# Patient Record
Sex: Female | Born: 1990 | Race: White | Hispanic: No | Marital: Married | Smoking: Never smoker
Health system: Southern US, Community
[De-identification: ages and names within clinical notes are randomized; demographics above are authoritative.]

## PROBLEM LIST (undated history)

## (undated) DIAGNOSIS — Z87442 Personal history of urinary calculi: Secondary | ICD-10-CM

## (undated) DIAGNOSIS — Z973 Presence of spectacles and contact lenses: Secondary | ICD-10-CM

## (undated) DIAGNOSIS — R350 Frequency of micturition: Secondary | ICD-10-CM

## (undated) DIAGNOSIS — N201 Calculus of ureter: Secondary | ICD-10-CM

## (undated) DIAGNOSIS — R3915 Urgency of urination: Secondary | ICD-10-CM

## (undated) DIAGNOSIS — N83209 Unspecified ovarian cyst, unspecified side: Secondary | ICD-10-CM

## (undated) DIAGNOSIS — K219 Gastro-esophageal reflux disease without esophagitis: Secondary | ICD-10-CM

## (undated) DIAGNOSIS — F419 Anxiety disorder, unspecified: Secondary | ICD-10-CM

## (undated) DIAGNOSIS — J45909 Unspecified asthma, uncomplicated: Secondary | ICD-10-CM

## (undated) DIAGNOSIS — Z9889 Other specified postprocedural states: Secondary | ICD-10-CM

## (undated) DIAGNOSIS — T8859XA Other complications of anesthesia, initial encounter: Secondary | ICD-10-CM

## (undated) DIAGNOSIS — R319 Hematuria, unspecified: Secondary | ICD-10-CM

## (undated) DIAGNOSIS — T4145XA Adverse effect of unspecified anesthetic, initial encounter: Secondary | ICD-10-CM

## (undated) DIAGNOSIS — R112 Nausea with vomiting, unspecified: Secondary | ICD-10-CM

## (undated) DIAGNOSIS — I10 Essential (primary) hypertension: Secondary | ICD-10-CM

## (undated) HISTORY — DX: Other specified postprocedural states: R11.2

## (undated) HISTORY — DX: Unspecified ovarian cyst, unspecified side: N83.209

## (undated) HISTORY — PX: OTHER SURGICAL HISTORY: SHX169

---
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
## 2013-05-16 ENCOUNTER — Ambulatory Visit (INDEPENDENT_AMBULATORY_CARE_PROVIDER_SITE_OTHER): Payer: Medicaid Other | Admitting: Adult Health

## 2013-05-16 ENCOUNTER — Encounter: Payer: Self-pay | Admitting: Adult Health

## 2013-05-16 ENCOUNTER — Other Ambulatory Visit (HOSPITAL_COMMUNITY)
Admission: RE | Admit: 2013-05-16 | Discharge: 2013-05-16 | Disposition: A | Discharge status: Home / Self Care | Payer: Medicaid Other | Source: Ambulatory Visit | Attending: Adult Health | Admitting: Adult Health

## 2013-05-16 VITALS — BP 128/88 | HR 76 | Ht 61.5 in | Wt 199.8 lb

## 2013-05-16 DIAGNOSIS — Z01419 Encounter for gynecological examination (general) (routine) without abnormal findings: Secondary | ICD-10-CM | POA: Insufficient documentation

## 2013-05-16 DIAGNOSIS — Z309 Encounter for contraceptive management, unspecified: Secondary | ICD-10-CM

## 2013-05-16 DIAGNOSIS — Z3202 Encounter for pregnancy test, result negative: Secondary | ICD-10-CM

## 2013-05-16 DIAGNOSIS — Z3049 Encounter for surveillance of other contraceptives: Secondary | ICD-10-CM

## 2013-05-16 DIAGNOSIS — Z113 Encounter for screening for infections with a predominantly sexual mode of transmission: Secondary | ICD-10-CM | POA: Insufficient documentation

## 2013-05-16 LAB — HEPATITIS C ANTIBODY: HCV Ab: NEGATIVE

## 2013-05-16 MED ORDER — ETONOGESTREL-ETHINYL ESTRADIOL 0.12-0.015 MG/24HR VA RING: VAGINAL_RING | VAGINAL | Status: DC

## 2013-05-16 NOTE — Patient Instructions (Addendum)
Physical in 1 year Use condoms  Call prn

## 2013-05-16 NOTE — Progress Notes (Signed)
Patient ID: Cathy Wells, female   DOB: 06-21-1991, 22 y.o.   MRN: [id number] History of Present Illness: Cathy Wells is a 22 year old white female in to discuss birth control and hs family planning medicaid and has to have a physical first.   Current Medications, Allergies, Past Medical History, Past Surgical History, Family History and Social History were reviewed in [employer] [facility] electronic medical record.     Review of Systems: Patient denies any headaches, blurred vision, shortness of breath, chest pain, abdominal pain, problems with bowel movements, urination, or intercourse. No joint pain or mood swings.    Physical Exam:BP 128/88  Pulse 76  Ht 5' 1.5" (1.562 m)  Wt 199 lb 12.8 oz (90.629 kg)  BMI 37.15 kg/m2  LMP 07/05/2014Urine pregnancy test negative General:  Well developed, well nourished, no acute distress Skin:  Warm and dry Neck:  Midline trachea, normal thyroid Lungs; Clear to auscultation bilaterally Breast:  No dominant palpable mass, retraction, or nipple discharge Cardiovascular: Regular rate and rhythm Abdomen:  Soft, non tender, no hepatosplenomegaly Pelvic:  External genitalia is normal in appearance.  The vagina is normal in appearance.  The cervix is nullpareous.Pap with GC/CHL  Uterus is felt to be normal size, shape, and contour.  No                adnexal masses or tenderness noted. Extremities:  No swelling or varicosities noted Psych:   Alert and cooperative, seems happy  Discussed birth control and wants nuva ring   Impression: Yearly exam-family planning medicaid Contraceptive management STD screening    Plan: Physical in 1 year Check HIV,RPR,HSV2, Hept B&C Rx nuva ring disp 1 ring with 11 refills. Ok to start today and use condoms

## 2013-12-15 ENCOUNTER — Encounter (HOSPITAL_COMMUNITY): Payer: Self-pay | Admitting: Emergency Medicine

## 2013-12-15 ENCOUNTER — Emergency Department (HOSPITAL_COMMUNITY)
Admission: EM | Admit: 2013-12-15 | Discharge: 2013-12-16 | Disposition: A | Discharge status: Home / Self Care | Payer: Medicaid Other | Attending: Emergency Medicine | Admitting: Emergency Medicine

## 2013-12-15 DIAGNOSIS — R519 Headache, unspecified: Secondary | ICD-10-CM

## 2013-12-15 DIAGNOSIS — Z880 Allergy status to penicillin: Secondary | ICD-10-CM | POA: Insufficient documentation

## 2013-12-15 DIAGNOSIS — I10 Essential (primary) hypertension: Secondary | ICD-10-CM

## 2013-12-15 DIAGNOSIS — M542 Cervicalgia: Secondary | ICD-10-CM | POA: Insufficient documentation

## 2013-12-15 DIAGNOSIS — J45909 Unspecified asthma, uncomplicated: Secondary | ICD-10-CM | POA: Insufficient documentation

## 2013-12-15 DIAGNOSIS — R51 Headache: Secondary | ICD-10-CM | POA: Insufficient documentation

## 2013-12-15 MED ORDER — BUTALBITAL-APAP-CAFFEINE 50-325-40 MG PO TABS: 1.0000 | ORAL_TABLET | Freq: Four times a day (QID) | ORAL | Status: AC | PRN

## 2013-12-15 MED ORDER — CYCLOBENZAPRINE HCL 10 MG PO TABS: 10.0000 mg | ORAL_TABLET | Freq: Two times a day (BID) | ORAL | Status: DC | PRN

## 2013-12-15 NOTE — ED Notes (Signed)
Pt reporting intermittent headache for last couple days which she believes is due to HTN.  Pt reports checking BP at home and reported result was aprox. 180/120

## 2013-12-15 NOTE — Discharge Instructions (Signed)
Headache: ° °You are having a headache. No specific cause was found today for your headache. It may have been a migraine or other cause of headache. Stress, anxiety, fatigue, and depression are common triggers for headaches. Your headache today does not appear to be life-threatening or require hospitalization, but often the exact cause of headaches is not determined in the emergency department. Therefore, followup with your doctor is very important to find out what may have caused your headache, and whether or not you need any further diagnostic testing or treatment. Sometimes headaches can appear benign but then more serious symptoms can develop which should prompt an immediate reevaluation by your doctor or the emergency department. ° °Seek immediate medical attention if: ° °· You develop possible problems with medications prescribed. °· The medications don't resolve your headache, if it recurs, or if you have multiple episodes of vomiting or can't take fluids by mouth °· You have a change from the usual headache. °· If you developed a sudden severe headache or confusion, become poorly responsive or faint, developed a fever above 100.4 or problems breathing, have a change in speech, vision, swallowing or understanding, or developed new weakness, numbness, tingling, incoordination or have a seizure. ° °If you don't have a family doctor to follow up with, see the follow up list below - call this morning for a follow-up appointment in the next 1-2 days. ° °

## 2013-12-15 NOTE — ED Provider Notes (Signed)
CSN: [id number]     Arrival date & time 12/15/13  2301 History   This chart was scribed for Vida RollerBrian D Khi Mcmillen, MD, by Yevette EdwardsAngela Bracken, ED Scribe. This patient was seen in room APA01/APA01 and the patient's care was started at 11:42 PM.  First MD Initiated Contact with Patient 12/15/13 2340     Chief Complaint  Patient presents with  . Hypertension   The history is provided by the patient and the spouse. No language interpreter was used.   HPI Comments: Cathy Wells is a 23 y.o. female who presents to the Emergency Department complaining of a headache which she characterizes as "excruciating" and which began a week ago (mild) and had been coming and going in an intermittent fashion for a week.  It worsened this evening. At bedside, the headache is currently resolved and went away spontaneously prior to arrival.  Her mother was concerned as to the cause of her headache and measured  Her BP measured at home - 180/129 in one arm and 170/112 in the other arm; in the ED her BP was 140/93. The pt denies a h/o HTN; she has a family history of HTN. 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 She also experienced neck pain and muscle-tension; the pt treated the pain with flexeril. She also has nasal congestion. She denies a cough. She also denies pressure under her eyes, dental pain, or pain associated with opening her jaw. No f/c/n/v/diarrhea.  Additionally, she denies blurred vision increased from baseline. She also denies slurred speech, numbness/paresthesia, dizziness, vision changes, or difficulties ambulating. She also denies OTC cold medication, smoking, or drinking. She has a h/o allergies; she uses medication as needed. The pt uses IBU a couple times of month.   Past Medical History  Diagnosis Date  . Allergy   . Asthma    Past Surgical History  Procedure Laterality Date  . Appendectomy     Family History  Problem Relation Age of Onset  .  Hypertension Mother   . Asthma Father   . Hypertension Father   . Hypertension Maternal Grandfather   . Diabetes Paternal Grandmother   . Hypertension Paternal Grandmother   . COPD Paternal Grandfather    History  Substance Use Topics  . Smoking status: Never Smoker   . Smokeless tobacco: Never Used  . Alcohol Use: No   No OB history provided.  Review of Systems  A complete 10 system review of systems was obtained, and all systems were negative except where indicated in the HPI and PE.    Allergies  Doxycycline; Amoxapine and related; Azithromycin; Clarithromycin; Penicillins; and Sulfa antibiotics  Home Medications   Current Outpatient Rx  Name  Route  Sig  Dispense  Refill  . butalbital-acetaminophen-caffeine (FIORICET) 50-325-40 MG per tablet   Oral   Take 1-2 tablets by mouth every 6 (six) hours as needed for headache.   20 tablet   0   . cyclobenzaprine (FLEXERIL) 10 MG tablet   Oral   Take 1 tablet (10 mg total) by mouth 2 (two) times daily as needed for muscle spasms.   20 tablet   0   . etonogestrel-ethinyl estradiol (NUVARING) 0.12-0.015 MG/24HR vaginal ring      Insert vaginally and leave in place for 3 consecutive weeks, then remove for 1 week.   1 each   12    Triage Vitals: BP 140/93  Pulse 104  Temp(Src) 98.7 F (37.1 C)  Resp 24  Ht 5\' 1"  (1.549 m)  Wt 200 lb (90.719 kg)  BMI 37.81 kg/m2  SpO2 100%  LMP 12/08/2013  Physical Exam  Nursing note and vitals reviewed. Constitutional: She is oriented to person, place, and time. She appears well-developed and well-nourished. No distress.  HENT:  Head: Normocephalic and atraumatic.  Nasal passages are clear - no d/c, clear TM's, clear OP. No sinus TTP  Eyes: EOM are normal. Pupils are equal, round, and reactive to light.  Neck: Neck supple. No tracheal deviation present.  Cardiovascular: Normal rate, regular rhythm, normal heart sounds and intact distal pulses.   Pulmonary/Chest: Effort  normal and breath sounds normal. No respiratory distress.  Abdominal: Soft. There is no tenderness.  Musculoskeletal: Normal range of motion. She exhibits no edema and no tenderness.  Lymphadenopathy:    She has no cervical adenopathy.  Neurological: She is alert and oriented to person, place, and time.  Neurologic exam:  Speech clear, pupils equal round reactive to light, extraocular movements intact  Normal peripheral visual fields Cranial nerves III through XII normal including no facial droop Follows commands, moves all extremities x4, normal strength to bilateral upper and lower extremities at all major muscle groups including grip Coordination intact, no limb ataxia, finger-nose-finger normal No pronator drift Gait normal   Skin: Skin is warm and dry.  Psychiatric: She has a normal mood and affect. Her behavior is normal.    ED Course  Procedures (including critical care time)  DIAGNOSTIC STUDIES: Oxygen Saturation is 100% on room air, normal by my interpretation.    COORDINATION OF CARE:  11:53 PM- Discussed treatment plan with patient, which includes instructing the pt to not use any OTC than tylenol, and the patient agreed to the plan.   Labs Review Labs Reviewed - No data to display Imaging Review No results found.  EKG Interpretation   None       MDM   Final diagnoses:  Headache  Hypertension    Overall she is well appaering, her BP has improved and here is 143/93, she has been encouraged to f/u in outpt setting - she is not on any meds chronically which would elevate this.  i would question the BP cuff being used at home as well as her pain / anxiety and stress related to HA - (appears to be tension headache and is currently resolved).  No red flags of pathological headache including no fever, no stiff neck and no neurological sx.  Meds given in ED:  Medications - No data to display  New Prescriptions   BUTALBITAL-ACETAMINOPHEN-CAFFEINE (FIORICET)  50-325-40 MG PER TABLET    Take 1-2 tablets by mouth every 6 (six) hours as needed for headache.   CYCLOBENZAPRINE (FLEXERIL) 10 MG TABLET    Take 1 tablet (10 mg total) by mouth 2 (two) times daily as needed for muscle spasms.      I personally performed the services described in this documentation, which was scribed in my presence. The recorded information has been reviewed and is accurate.       Vida Roller, MD 12/16/13 [phone]

## 2015-01-28 ENCOUNTER — Encounter: Payer: Self-pay | Admitting: Adult Health

## 2015-01-28 ENCOUNTER — Ambulatory Visit (INDEPENDENT_AMBULATORY_CARE_PROVIDER_SITE_OTHER): Payer: BLUE CROSS/BLUE SHIELD | Admitting: Adult Health

## 2015-01-28 ENCOUNTER — Other Ambulatory Visit (HOSPITAL_COMMUNITY)
Admission: RE | Admit: 2015-01-28 | Discharge: 2015-01-28 | Disposition: A | Discharge status: Home / Self Care | Payer: BLUE CROSS/BLUE SHIELD | Source: Ambulatory Visit | Attending: Obstetrics and Gynecology | Admitting: Obstetrics and Gynecology

## 2015-01-28 VITALS — BP 124/90 | HR 92 | Ht 61.0 in | Wt 209.0 lb

## 2015-01-28 DIAGNOSIS — Z01419 Encounter for gynecological examination (general) (routine) without abnormal findings: Secondary | ICD-10-CM | POA: Insufficient documentation

## 2015-01-28 NOTE — Progress Notes (Signed)
Patient ID: Renato BattlesVictoria L Mullany, female   DOB: [birth year], 24 y.o.   MRN: [id number] History of Present Illness: Benetta SparVictoria is a 24 year old white female, married in for well woman gyn exam and pap.She stopped birth control last year in May, and desires pregnancy in near future.   Current Medications, Allergies, Past Medical History, Past Surgical History, Family History and Social History were reviewed in [facility] electronic medical record.     Review of Systems: Patient denies any headaches, hearing loss, fatigue, blurred vision, shortness of breath, chest pain, abdominal pain, problems with bowel movements, urination, or intercourse. No joint pain or mood swings.    Physical Exam:BP 124/90 mmHg  Pulse 92  Ht 5\' 1"  (1.549 m)  Wt 209 lb (94.802 kg)  BMI 39.51 kg/m2  LMP 01/20/2015 General:  Well developed, well nourished, no acute distress Skin:  Warm and dry Neck:  Midline trachea, normal thyroid, good ROM, no lymphadenopathy Lungs; Clear to auscultation bilaterally Breast:  No dominant palpable mass, retraction, or nipple discharge Cardiovascular: Regular rate and rhythm Abdomen:  Soft, non tender, no hepatosplenomegaly Pelvic:  External genitalia is normal in appearance, no lesions.  The vagina is normal in appearance, with good color, moisture and rugae. Urethra has no lesions or masses. The cervix is smooth, pap performed.  Uterus is felt to be normal size, shape, and contour.  No adnexal masses or tenderness noted.Bladder is non tender, no masses felt. Extremities/musculoskeletal:  No swelling or varicosities noted, no clubbing or cyanosis Psych:  No mood changes, alert and cooperative,seems happy   Impression: Well woman gyn exam with pap     Plan: Physical in 1 year Review preparing for pregnancy Take OTC prenatal or folic acid Discussed timing of sex and lay there after, pee before

## 2015-01-28 NOTE — Patient Instructions (Signed)
Physical in 1 year Take folic acid or prenatal vitamin Preparing for Pregnancy Before trying to become pregnant, make an appointment with your health care provider (preconception care). The goal is to help you have a healthy, safe pregnancy. At your first appointment, your health care provider will:   Do a complete physical exam, including a Pap test.  Take a complete medical history.  Give you advice and help you resolve any problems. PRECONCEPTION CHECKLIST Here is a list of the basics to cover with your health care provider at your preconception visit:  Medical history.  Tell your health care provider about any diseases you have had. Many diseases can affect your pregnancy.  Include your partner's medical history and family history.  Make sure you have been tested for sexually transmitted infections (STIs). These can affect your pregnancy. In some cases, they can be passed to your baby. Tell your health care provider about any history of STIs.  Make sure your health care provider knows about any previous problems you have had with conception or pregnancy.  Tell your health care provider about any medicine you take. This includes herbal supplements and over-the-counter medicines.  Make sure all your immunizations are up to date. You may need to make additional appointments.  Ask your health care provider if you need any vaccinations or if there are any you should avoid.  Diet.  It is especially important to eat a healthy, balanced diet with the right nutrients when you are pregnant.  Ask your health care provider to help you get to a healthy weight before pregnancy.  If you are overweight, you are at higher risk for certain complications. These include high blood pressure, diabetes, and preterm birth.  If you are underweight, you are more likely to have a low-birth-weight baby.  Lifestyle.  Tell your health care provider about lifestyle factors such as alcohol use, drug  use, or smoking.  Describe any harmful substances you may be exposed to at work or home. These can include chemicals, pesticides, and radiation.  Mental health.  Let your health care provider know if you have been feeling depressed or anxious.  Let your health care provider know if you have a history of substance abuse.  Let your health care provider know if you do not feel safe at home. HOME INSTRUCTIONS TO PREPARE FOR PREGNANCY Follow your health care provider's advice and instructions.   Keep an accurate record of your menstrual periods. This makes it easier for your health care provider to determine your baby's due date.  Begin taking prenatal vitamins and folic acid supplements daily. Take them as directed by your health care provider.  Eat a balanced diet. Get help from a nutrition counselor if you have questions or need help.  Get regular exercise. Try to be active for at least 30 minutes a day most days of the week.  Quit smoking, if you smoke.  Do not drink alcohol.  Do not take illegal drugs.  Get medical problems, such as diabetes or high blood pressure, under control.  If you have diabetes, make sure you do the following:  Have good blood sugar control. If you have type 1 diabetes, use multiple daily doses of insulin. Do not use split-dose or premixed insulin.  Have an eye exam by a qualified eye care professional trained in caring for people with diabetes.  Get evaluated by your health care provider for cardiovascular disease.  Get to a healthy weight. If you are overweight or obese,  reduce your weight with the help of a qualified health professional such as a Museum/gallery exhibitions officer. Ask your health care provider what the right weight range is for you. HOW DO I KNOW I AM PREGNANT? You may be pregnant if you have been sexually active and you miss your period. Symptoms of early pregnancy include:   Mild cramping.  Very light vaginal bleeding (spotting).  Feeling  unusually tired.  Morning sickness. If you have any of these symptoms, take a home pregnancy test. These tests look for a hormone called human chorionic gonadotropin (hCG) in your urine. Your body begins to make this hormone during early pregnancy. These tests are very accurate. Wait until at least the first day you miss your period to take one. If you get a positive result, call your health care provider to make appointments for prenatal care. WHAT SHOULD I DO IF I BECOME PREGNANT?  Make an appointment with your health care provider by week 12 of your pregnancy at the latest.  Do not smoke. Smoking can be harmful to your baby.  Do not drink alcoholic beverages. Alcohol is related to a number of birth defects.  Avoid toxic odors and chemicals.  You may continue to have sexual intercourse if it does not cause pain or other problems, such as vaginal bleeding. Document Released: 09/30/2008 Document Revised: 03/04/2014 Document Reviewed: 09/24/2013 [facility] Patient Information 2015 [place], [place]. This information is not intended to replace advice given to you by your health care provider. Make sure you discuss any questions you have with your health care provider.

## 2015-01-29 LAB — CYTOLOGY - PAP

## 2015-04-10 ENCOUNTER — Ambulatory Visit: Payer: BLUE CROSS/BLUE SHIELD | Admitting: Adult Health

## 2015-04-15 ENCOUNTER — Encounter: Payer: Self-pay | Admitting: Adult Health

## 2015-04-15 ENCOUNTER — Ambulatory Visit (INDEPENDENT_AMBULATORY_CARE_PROVIDER_SITE_OTHER): Payer: BLUE CROSS/BLUE SHIELD | Admitting: Adult Health

## 2015-04-15 VITALS — BP 112/80 | HR 80 | Ht 62.0 in | Wt 210.5 lb

## 2015-04-15 DIAGNOSIS — N941 Dyspareunia: Secondary | ICD-10-CM | POA: Diagnosis not present

## 2015-04-15 DIAGNOSIS — IMO0002 Reserved for concepts with insufficient information to code with codable children: Secondary | ICD-10-CM

## 2015-04-15 NOTE — Patient Instructions (Signed)
Dyspareunia Dyspareunia is pain during sexual intercourse. It is most common in women, but it also happens in men.  CAUSES  Female The pain from this condition is usually felt when anything is put into the vagina, but any part of the genitals may cause pain during sex. Even sitting or wearing pants can cause pain. Sometimes, a cause cannot be found. Some causes of pain during intercourse are:  Infections of the skin around the vagina.  Vaginal infections, such as a yeast, bacterial, or viral infection.  Vaginismus. This is the inability to have anything put in the vagina even when the woman wants it to happen. There is an automatic muscle contraction and pain. The pain of the muscle contraction can be so severe that intercourse is impossible.  Allergic reaction from spermicides, semen, condoms, scented tampons, soaps, douches, and vaginal sprays.  A fluid-filled sac (cyst) on the Bartholin or Skene glands, located at the opening of the vagina.  Scar tissue in the vagina from a surgically enlarged opening (episiotomy) or tearing after delivering a baby.  Vaginal dryness. This is more common in menopause. The normal secretions of the vagina are decreased. Changes in estrogen levels and increased difficulty becoming aroused can cause painful sex. Vaginal dryness can also happen when taking birth control pills.  Thinning of the tissue (atrophy) of the vulva and vagina. This makes the area thinner, smaller, unable to stretch to accommodate a penis, and prone to infection and tearing.  Vulvar vestibulitis or vestibulodynia.This is a condition that causes pain involving the area around the entrance to the vagina.The most common cause in young women is birth control pills.Women with low estrogen levels (postmenopausal women) may also experience this.Other causes include allergic reactions, too many nerve endings, skin conditions, and pelvic muscles that cannot relax.  Vulvar dermatoses. This  includes skin conditions such as lichen sclerosus and lichen planus.  Lack of foreplay to lubricate the vagina. This can cause vaginal dryness.  Noncancerous tumors (fibroids) in the uterus.  Uterus lining tissue growing outside the uterus (endometriosis).  Pregnancy that starts in the fallopian tube (tubal pregnancy).  Pregnancy or breastfeeding your baby. This can cause vaginal dryness.  A tilting or prolapse of the uterus. Prolapse is when weak and stretched muscles around the uterus allow it to fall into the vagina.  Problems with the ovaries, cysts, or scar tissue. This may be worse with certain sexual positions.  Previous surgeries causing adhesions or scar tissue in the vagina or pelvis.  Bladder and intestinal problems.  Psychological problems (such as depression or anxiety). This may make pain worse.  Negative attitudes about sex, experiencing rape, sexual assault, and misinformation about sex. These issues are often related to some types of pain.  Previous pelvic infection, causing scar tissue in the pelvis and on the female organs.  Cyst or tumor on the ovary.  Cancer of the female organs.  Certain medicines.  Medical problems such as diabetes, arthritis, or thyroid disease. Female In men, there are many physical causes of sexual discomfort. Some causes of pain during intercourse are:  Infections of the prostate, bladder, or seminal vesicles. This can cause pain after ejaculation.  An inflamed bladder (interstitial cystitis). This may cause pain from ejaculation.  Gonorrheal infections. This may cause pain during ejaculation.  An inflamed urethra (urethritis) or inflamed prostate (prostatitis). This can make genital stimulation painful or uncomfortable.  Deformities of the penis, such as Peyronie's disease.  A tight foreskin.  Cancer of the female organs.    Psychological problems. This may make pain worse. DIAGNOSIS   Your caregiver will take a history and  have you describe where the pain is located (outside the vagina, in the vagina, in the pelvis). You may be asked when you experience pain, such as with penetration or with thrusting.  Following this, your caregiver will do a physical exam. Let your caregiver know if the exam is too painful.  During the final part of the female exam, your caregiver will feel your uterus and ovaries with one hand on the abdomen and one finger in your vagina. This is a pelvic exam.  Blood tests, a Pap test, cultures for infection, an ultrasound test, and X-rays may be done. You may need to see a specialist for female problems (gynecologist).  Your caregiver may do a CT scan, MRI, or laparoscopy. Laparoscopy is a procedure to look into the pelvis with a lighted tube, through a cut (incision) in the abdomen. TREATMENT  Your caregiver can help you determine the best course of treatment. Sometimes, more testing is done. Continue with the suggested testing until your caregiver feels sure about your diagnosis and how to treat it. Sometimes, it is difficult to find the reason for the pain. The search for the cause and treatment can be frustrating. Treatment often takes several weeks to a few months before you notice any improvement. You may also need to avoid sexual activity until symptoms improve.Continuing to have sex when it hurts can delay healing and actually make the problem worse. The treatment depends on the cause of the pain. Treatment may include:  Medicines such as antibiotics, vaginal or skin creams, hormones, or antidepressants.  Minor or major surgery.  Psychological counseling or group therapy.  Kegel exercises and vaginal dilators to help certain cases of vaginismus (spasms). Do this only if recommended by your caregiver.Kegel exercises can make some problems worse.  Applying lubrication as recommended by your caregiver if you have dryness.  Sex therapy for you and your sex partner. It is common for  the pain to continue after the reason for the pain has been treated. Some reasons for this include a conditioned response. This means the person having the pain becomes so familiar with the pain that the pain continues as a response, even though the cause is removed. Sex therapy can help with this problem. HOME CARE INSTRUCTIONS   Follow your caregiver's instructions about taking medicines, tests, counseling, and follow-up treatment.  Do not use scented tampons, douches, vaginal sprays, or soaps.  Use water-based lubricants for dryness. Oil lubricants can cause irritation.  Do not use spermicides or condoms that irritate you.  Openly discuss with your partner your sexual experience, your desires, foreplay, and different sexual positions for a more comfortable and enjoyable sexual relationship.  Join group sessions for therapy, if needed.  Practice safe sex at all times.  Empty your bladder before having intercourse.  Try different positions during sexual intercourse.  Take over-the-counter pain medicine recommended by your caregiver before having sexual intercourse.  Do not wear pantyhose. Knee-high and thigh-high hose are okay.  Avoid scrubbing your vulva with a washcloth. Wash the area gently and pat dry with a towel. SEEK MEDICAL CARE IF:   You develop vaginal bleeding after sexual intercourse.  You develop a lump at the opening of your vagina, even if it is not painful.  You have abnormal vaginal discharge.  You have vaginal dryness.  You have itching or irritation of the vulva or vagina.  You   develop a rash or reaction to your medicine. SEEK IMMEDIATE MEDICAL CARE IF:   You develop severe abdominal pain during or shortly after sexual intercourse. You could have a ruptured ovarian cyst or ruptured tubal pregnancy.  You have a fever.  You have painful or bloody urination.  You have painful sexual intercourse, and you never had it before.  You pass out after having  sexual intercourse. Document Released: 11/07/2007 Document Revised: 01/10/2012 Document Reviewed: 01/18/2011 [facility] Patient Information 2015 [place], [place]. This information is not intended to replace advice given to you by your health care provider. Make sure you discuss any questions you have with your health care provider. Increase foreplay Use good lubricate and positions

## 2015-04-15 NOTE — Progress Notes (Signed)
Subjective:     Patient ID: Cathy Wells, female   DOB: 1991-01-22, 24 y.o.   MRN: [id number]  HPI Cathy Wells is a 24 year old white female in complaining of pain with sex and burning with urination after sex, first noticed about 2 weeks ago.  Review of Systems Patient denies any headaches, hearing loss, fatigue, blurred vision, shortness of breath, chest pain, abdominal pain, problems with bowel movements, urination, or intercourse. No joint pain or mood swings.+Pain with sex.  Reviewed past medical,surgical, social and family history. Reviewed medications and allergies.     Objective:   Physical Exam BP 112/80 mmHg  Pulse 80  Ht 5\' 2"  (1.575 m)  Wt 210 lb 8 oz (95.482 kg)  BMI 38.49 kg/m2  LMP 04/13/2015 Skin warm and dry.Pelvic: external genitalia is normal in appearance no lesions, vagina: period like blood  without odor,urethra has no lesions or masses noted, cervix:smooth, uterus: normal size, shape and contour, non tender, no masses felt, adnexa: no masses or tenderness noted. Bladder is non tender and no masses felt.Can reproduce discomfort/apin when inserted 2 fingers in vagina, discussed increased foreplay and use good lubricate.    Assessment:     Dyspareunia     Plan:     Increase foreplay Use good lubricate and change positions Follow up prn

## 2015-04-28 ENCOUNTER — Ambulatory Visit (INDEPENDENT_AMBULATORY_CARE_PROVIDER_SITE_OTHER): Payer: BLUE CROSS/BLUE SHIELD | Admitting: Adult Health

## 2015-04-28 ENCOUNTER — Encounter: Payer: Self-pay | Admitting: Adult Health

## 2015-04-28 VITALS — BP 108/80 | HR 84 | Ht 62.0 in | Wt 210.5 lb

## 2015-04-28 DIAGNOSIS — R309 Painful micturition, unspecified: Secondary | ICD-10-CM

## 2015-04-28 DIAGNOSIS — N390 Urinary tract infection, site not specified: Secondary | ICD-10-CM | POA: Diagnosis not present

## 2015-04-28 LAB — POCT URINALYSIS DIPSTICK
Glucose, UA: NEGATIVE
NITRITE UA: POSITIVE
PROTEIN UA: NEGATIVE

## 2015-04-28 MED ORDER — NITROFURANTOIN MONOHYD MACRO 100 MG PO CAPS: 100.0000 mg | ORAL_CAPSULE | Freq: Two times a day (BID) | ORAL | Status: DC

## 2015-04-28 MED ORDER — PHENAZOPYRIDINE HCL 200 MG PO TABS: 200.0000 mg | ORAL_TABLET | Freq: Three times a day (TID) | ORAL | Status: DC | PRN

## 2015-04-28 NOTE — Progress Notes (Signed)
Subjective:     Patient ID: Cathy Wells, female   DOB: [birth year], 24 y.o.   MRN: [id number]  HPI Cathy Wells is a 24 year old white female in complaining of pain with urination for few days.Still has pain with sex.  Review of Systems Patient denies any headaches, hearing loss, fatigue, blurred vision, shortness of breath, chest pain, abdominal pain, problems with bowel movements.  No joint pain or mood swings.See HPI for positives. Reviewed past medical,surgical, social and family history. Reviewed medications and allergies.     Objective:   Physical Exam BP 108/80 mmHg  Pulse 84  Ht 5\' 2"  (1.575 m)  Wt 210 lb 8 oz (95.482 kg)  BMI 38.49 kg/m2  LMP 06/12/2016urine 2+leuks,1+blood and + nitrates, No CVAT, tender over bladder   Keep trying different positions with sex.Push fluids and take antibiotics all gone.  Assessment:    UTI Pain with urination    Plan:    UA C&S sent Rx Macrobid 1 bid x 7 days Rx pyridium 200 mg 1 tid x 3 days #10   Push water Follow up prn Review handout on UTI

## 2015-04-28 NOTE — Patient Instructions (Signed)
Push water Follow up prn Take meds Urinary Tract Infection Urinary tract infections (UTIs) can develop anywhere along your urinary tract. Your urinary tract is your body's drainage system for removing wastes and extra water. Your urinary tract includes two kidneys, two ureters, a bladder, and a urethra. Your kidneys are a pair of bean-shaped organs. Each kidney is about the size of your fist. They are located below your ribs, one on each side of your spine. CAUSES Infections are caused by microbes, which are microscopic organisms, including fungi, viruses, and bacteria. These organisms are so small that they can only be seen through a microscope. Bacteria are the microbes that most commonly cause UTIs. SYMPTOMS  Symptoms of UTIs may vary by age and gender of the patient and by the location of the infection. Symptoms in young women typically include a frequent and intense urge to urinate and a painful, burning feeling in the bladder or urethra during urination. Older women and men are more likely to be tired, shaky, and weak and have muscle aches and abdominal pain. A fever may mean the infection is in your kidneys. Other symptoms of a kidney infection include pain in your back or sides below the ribs, nausea, and vomiting. DIAGNOSIS To diagnose a UTI, your caregiver will ask you about your symptoms. Your caregiver also will ask to provide a urine sample. The urine sample will be tested for bacteria and white blood cells. White blood cells are made by your body to help fight infection. TREATMENT  Typically, UTIs can be treated with medication. Because most UTIs are caused by a bacterial infection, they usually can be treated with the use of antibiotics. The choice of antibiotic and length of treatment depend on your symptoms and the type of bacteria causing your infection. HOME CARE INSTRUCTIONS  If you were prescribed antibiotics, take them exactly as your caregiver instructs you. Finish the  medication even if you feel better after you have only taken some of the medication.  Drink enough water and fluids to keep your urine clear or pale yellow.  Avoid caffeine, tea, and carbonated beverages. They tend to irritate your bladder.  Empty your bladder often. Avoid holding urine for long periods of time.  Empty your bladder before and after sexual intercourse.  After a bowel movement, women should cleanse from front to back. Use each tissue only once. SEEK MEDICAL CARE IF:   You have back pain.  You develop a fever.  Your symptoms do not begin to resolve within 3 days. SEEK IMMEDIATE MEDICAL CARE IF:   You have severe back pain or lower abdominal pain.  You develop chills.  You have nausea or vomiting.  You have continued burning or discomfort with urination. MAKE SURE YOU:   Understand these instructions.  Will watch your condition.  Will get help right away if you are not doing well or get worse. Document Released: 07/28/2005 Document Revised: 04/18/2012 Document Reviewed: 11/26/2011 [facility] Patient Information 2015 [place]. This information is not intended to replace advice given to you by your health care provider. Make sure you discuss any questions you have with your health care provider.

## 2015-04-29 ENCOUNTER — Telehealth: Payer: Self-pay | Admitting: *Deleted

## 2015-04-29 LAB — URINALYSIS, ROUTINE W REFLEX MICROSCOPIC
Bilirubin, UA: NEGATIVE
GLUCOSE, UA: NEGATIVE
KETONES UA: NEGATIVE
Nitrite, UA: POSITIVE — AB
RBC, UA: NEGATIVE
Specific Gravity, UA: 1.026 (ref 1.005–1.030)
UUROB: 1 mg/dL (ref 0.2–1.0)
pH, UA: 7.5 (ref 5.0–7.5)

## 2015-04-29 LAB — MICROSCOPIC EXAMINATION: CASTS: NONE SEEN /LPF

## 2015-04-29 NOTE — Telephone Encounter (Signed)
Will give note for 6/27 and 6/28 has UTI, had pain and meds make her sleepy

## 2015-04-30 LAB — URINE CULTURE

## 2015-05-27 ENCOUNTER — Telehealth: Payer: Self-pay | Admitting: Adult Health

## 2015-05-27 ENCOUNTER — Ambulatory Visit (INDEPENDENT_AMBULATORY_CARE_PROVIDER_SITE_OTHER): Payer: BLUE CROSS/BLUE SHIELD | Admitting: Adult Health

## 2015-05-27 ENCOUNTER — Encounter: Payer: Self-pay | Admitting: Adult Health

## 2015-05-27 VITALS — BP 126/82 | HR 76 | Ht 62.0 in | Wt 210.0 lb

## 2015-05-27 DIAGNOSIS — IMO0002 Reserved for concepts with insufficient information to code with codable children: Secondary | ICD-10-CM

## 2015-05-27 DIAGNOSIS — Z3202 Encounter for pregnancy test, result negative: Secondary | ICD-10-CM | POA: Diagnosis not present

## 2015-05-27 DIAGNOSIS — R350 Frequency of micturition: Secondary | ICD-10-CM | POA: Diagnosis not present

## 2015-05-27 DIAGNOSIS — R102 Pelvic and perineal pain unspecified side: Secondary | ICD-10-CM

## 2015-05-27 DIAGNOSIS — N760 Acute vaginitis: Secondary | ICD-10-CM

## 2015-05-27 DIAGNOSIS — A499 Bacterial infection, unspecified: Secondary | ICD-10-CM

## 2015-05-27 DIAGNOSIS — B9689 Other specified bacterial agents as the cause of diseases classified elsewhere: Secondary | ICD-10-CM

## 2015-05-27 DIAGNOSIS — N941 Dyspareunia: Secondary | ICD-10-CM | POA: Diagnosis not present

## 2015-05-27 LAB — POCT URINALYSIS DIPSTICK
GLUCOSE UA: NEGATIVE
Leukocytes, UA: NEGATIVE
Nitrite, UA: NEGATIVE
RBC UA: NEGATIVE

## 2015-05-27 LAB — POCT WET PREP (WET MOUNT): WBC WET PREP: POSITIVE

## 2015-05-27 LAB — POCT URINE PREGNANCY: PREG TEST UR: NEGATIVE

## 2015-05-27 MED ORDER — FLUCONAZOLE 150 MG PO TABS: ORAL_TABLET | ORAL | Status: DC

## 2015-05-27 MED ORDER — METRONIDAZOLE 500 MG PO TABS: 500.0000 mg | ORAL_TABLET | Freq: Two times a day (BID) | ORAL | Status: DC

## 2015-05-27 NOTE — Progress Notes (Addendum)
Subjective:     Patient ID: Cathy Wells, female   DOB: 12-29-1990, 24 y.o.   MRN: [id number]  HPI Cathy Wells is a 24 year old in complaining of urinary frequency and pain with sex which seems worse, it started in May.She denies any injury of forced sex, she was treated in June for UTI.She is feeling more emotional at times and says it hurts and burns with sex, it is better if she is on top and does not move, they have tried different positions and lubrication and increased forepaly.Has pelvic pain too, was really painful yesterday, missed work.  Review of Systems Patient denies any headaches, hearing loss, fatigue, blurred vision, shortness of breath, chest pain, abdominal pain, problems with bowel movements. No joint pain or mood swings.See HPI for positives.  Reviewed past medical,surgical, social and family history. Reviewed medications and allergies.     Objective:   Physical Exam BP 126/82 mmHg  Pulse 76  Ht  (1.575 m)  Wt 210 lb (95.255 kg)  BMI 38.40 kg/m2  LMP 05/13/2015 (Approximate)UPT negative, urine dipstick trace protein, Skin warm and dry.Pelvic: external genitalia is normal in appearance no lesions, vagina: white discharge with slight odor,urethra has no lesions or masses noted, but is tender with palpation and has pain a 10 and 2 o'clock in vagina, cervix:smooth, negative CMT, uterus: normal size, shape and contour, non tender, no masses felt, adnexa: no masses, LLQ tenderness noted. Bladder is non tender and no masses felt. Wet prep: + for clue cells and +WBCs.GC/CHL sent.     Assessment:    Dyspareunia Pelvic pain Urinary frequency BV     Plan:    GC/CHL sent Rx diflucan 150 mg #2 take 1 now and 1 in 3 days, no refills Rx flagyl 500 mg 1 bid x 7 days, no alcohol, review handout on BV   Return in 2 days for gyn [place] and see Dr Despina Hidden Review handouts on BV,pelvic pain and dyspareunia Note given was out of work yesterday

## 2015-05-27 NOTE — Addendum Note (Signed)
Addended by: Cyril Mourning A on: 05/27/2015 10:27 AM   Modules accepted: Orders

## 2015-05-27 NOTE — Telephone Encounter (Signed)
Left message that I called diflucan in too

## 2015-05-27 NOTE — Patient Instructions (Signed)
Take flagyl no alcohol or sex Return in 2 days for gyn [place] and see Dr Despina Hidden Dyspareunia Dyspareunia is pain during sexual intercourse. It is most common in women, but it also happens in men.  CAUSES  Female The pain from this condition is usually felt when anything is put into the vagina, but any part of the genitals may cause pain during sex. Even sitting or wearing pants can cause pain. Sometimes, a cause cannot be found. Some causes of pain during intercourse are:  Infections of the skin around the vagina.  Vaginal infections, such as a yeast, bacterial, or viral infection.  Vaginismus. This is the inability to have anything put in the vagina even when the woman wants it to happen. There is an automatic muscle contraction and pain. The pain of the muscle contraction can be so severe that intercourse is impossible.  Allergic reaction from spermicides, semen, condoms, scented tampons, soaps, douches, and vaginal sprays.  A fluid-filled sac (cyst) on the Bartholin or Skene glands, located at the opening of the vagina.  Scar tissue in the vagina from a surgically enlarged opening (episiotomy) or tearing after delivering a baby.  Vaginal dryness. This is more common in menopause. The normal secretions of the vagina are decreased. Changes in estrogen levels and increased difficulty becoming aroused can cause painful sex. Vaginal dryness can also happen when taking birth control pills.  Thinning of the tissue (atrophy) of the vulva and vagina. This makes the area thinner, smaller, unable to stretch to accommodate a penis, and prone to infection and tearing.  Vulvar vestibulitis or vestibulodynia.This is a condition that causes pain involving the area around the entrance to the vagina.The most common cause in young women is birth control pills.Women with low estrogen levels (postmenopausal women) may also experience this.Other causes include allergic reactions, too many nerve endings, skin  conditions, and pelvic muscles that cannot relax.  Vulvar dermatoses. This includes skin conditions such as lichen sclerosus and lichen planus.  Lack of foreplay to lubricate the vagina. This can cause vaginal dryness.  Noncancerous tumors (fibroids) in the uterus.  Uterus lining tissue growing outside the uterus (endometriosis).  Pregnancy that starts in the fallopian tube (tubal pregnancy).  Pregnancy or breastfeeding your baby. This can cause vaginal dryness.  A tilting or prolapse of the uterus. Prolapse is when weak and stretched muscles around the uterus allow it to fall into the vagina.  Problems with the ovaries, cysts, or scar tissue. This may be worse with certain sexual positions.  Previous surgeries causing adhesions or scar tissue in the vagina or pelvis.  Bladder and intestinal problems.  Psychological problems (such as depression or anxiety). This may make pain worse.  Negative attitudes about sex, experiencing rape, sexual assault, and misinformation about sex. These issues are often related to some types of pain.  Previous pelvic infection, causing scar tissue in the pelvis and on the female organs.  Cyst or tumor on the ovary.  Cancer of the female organs.  Certain medicines.  Medical problems such as diabetes, arthritis, or thyroid disease. Female In men, there are many physical causes of sexual discomfort. Some causes of pain during intercourse are:  Infections of the prostate, bladder, or seminal vesicles. This can cause pain after ejaculation.  An inflamed bladder (interstitial cystitis). This may cause pain from ejaculation.  Gonorrheal infections. This may cause pain during ejaculation.  An inflamed urethra (urethritis) or inflamed prostate (prostatitis). This can make genital stimulation painful or uncomfortable.  Deformities  of the penis, such as Peyronie's disease.  A tight foreskin.  Cancer of the female organs.  Psychological problems. This  may make pain worse. DIAGNOSIS   Your caregiver will take a history and have you describe where the pain is located (outside the vagina, in the vagina, in the pelvis). You may be asked when you experience pain, such as with penetration or with thrusting.  Following this, your caregiver will do a physical exam. Let your caregiver know if the exam is too painful.  During the final part of the female exam, your caregiver will feel your uterus and ovaries with one hand on the abdomen and one finger in your vagina. This is a pelvic exam.  Blood tests, a Pap test, cultures for infection, an ultrasound test, and X-rays may be done. You may need to see a specialist for female problems (gynecologist).  Your caregiver may do a CT scan, MRI, or laparoscopy. Laparoscopy is a procedure to look into the pelvis with a lighted tube, through a cut (incision) in the abdomen. TREATMENT  Your caregiver can help you determine the best course of treatment. Sometimes, more testing is done. Continue with the suggested testing until your caregiver feels sure about your diagnosis and how to treat it. Sometimes, it is difficult to find the reason for the pain. The search for the cause and treatment can be frustrating. Treatment often takes several weeks to a few months before you notice any improvement. You may also need to avoid sexual activity until symptoms improve.Continuing to have sex when it hurts can delay healing and actually make the problem worse. The treatment depends on the cause of the pain. Treatment may include:  Medicines such as antibiotics, vaginal or skin creams, hormones, or antidepressants.  Minor or major surgery.  Psychological counseling or group therapy.  Kegel exercises and vaginal dilators to help certain cases of vaginismus (spasms). Do this only if recommended by your caregiver.Kegel exercises can make some problems worse.  Applying lubrication as recommended by your caregiver if you  have dryness.  Sex therapy for you and your sex partner. It is common for the pain to continue after the reason for the pain has been treated. Some reasons for this include a conditioned response. This means the person having the pain becomes so familiar with the pain that the pain continues as a response, even though the cause is removed. Sex therapy can help with this problem. HOME CARE INSTRUCTIONS   Follow your caregiver's instructions about taking medicines, tests, counseling, and follow-up treatment.  Do not use scented tampons, douches, vaginal sprays, or soaps.  Use water-based lubricants for dryness. Oil lubricants can cause irritation.  Do not use spermicides or condoms that irritate you.  Openly discuss with your partner your sexual experience, your desires, foreplay, and different sexual positions for a more comfortable and enjoyable sexual relationship.  Join group sessions for therapy, if needed.  Practice safe sex at all times.  Empty your bladder before having intercourse.  Try different positions during sexual intercourse.  Take over-the-counter pain medicine recommended by your caregiver before having sexual intercourse.  Do not wear pantyhose. Knee-high and thigh-high hose are okay.  Avoid scrubbing your vulva with a washcloth. Wash the area gently and pat dry with a towel. SEEK MEDICAL CARE IF:   You develop vaginal bleeding after sexual intercourse.  You develop a lump at the opening of your vagina, even if it is not painful.  You have abnormal vaginal discharge.  You have vaginal dryness.  You have itching or irritation of the vulva or vagina.  You develop a rash or reaction to your medicine. SEEK IMMEDIATE MEDICAL CARE IF:   You develop severe abdominal pain during or shortly after sexual intercourse. You could have a ruptured ovarian cyst or ruptured tubal pregnancy.  You have a fever.  You have painful or bloody urination.  You have painful  sexual intercourse, and you never had it before.  You pass out after having sexual intercourse. Document Released: 11/07/2007 Document Revised: 01/10/2012 Document Reviewed: 01/18/2011 [facility] Patient Information 2015 [place], [place]. This information is not intended to replace advice given to you by your health care provider. Make sure you discuss any questions you have with your health care provider. Pelvic Pain Female pelvic pain can be caused by many different things and start from a variety of places. Pelvic pain refers to pain that is located in the lower half of the abdomen and between your hips. The pain may occur over a short period of time (acute) or may be reoccurring (chronic). The cause of pelvic pain may be related to disorders affecting the female reproductive organs (gynecologic), but it may also be related to the bladder, kidney stones, an intestinal complication, or muscle or skeletal problems. Getting help right away for pelvic pain is important, especially if there has been severe, sharp, or a sudden onset of unusual pain. It is also important to get help right away because some types of pelvic pain can be life threatening.  CAUSES  Below are only some of the causes of pelvic pain. The causes of pelvic pain can be in one of several categories.   Gynecologic.  Pelvic inflammatory disease.  Sexually transmitted infection.  Ovarian cyst or a twisted ovarian ligament (ovarian torsion).  Uterine lining that grows outside the uterus (endometriosis).  Fibroids, cysts, or tumors.  Ovulation.  Pregnancy.  Pregnancy that occurs outside the uterus (ectopic pregnancy).  Miscarriage.  Labor.  Abruption of the placenta or ruptured uterus.  Infection.  Uterine infection (endometritis).  Bladder infection.  Diverticulitis.  Miscarriage related to a uterine infection (septic abortion).  Bladder.  Inflammation of the bladder (cystitis).  Kidney  Crandle(s).  Gastrointestinal.  Constipation.  Diverticulitis.  Neurologic.  Trauma.  Feeling pelvic pain because of mental or emotional causes (psychosomatic).  Cancers of the bowel or pelvis. EVALUATION  Your caregiver will want to take a careful history of your concerns. This includes recent changes in your health, a careful gynecologic history of your periods (menses), and a sexual history. Obtaining your family history and medical history is also important. Your caregiver may suggest a pelvic exam. A pelvic exam will help identify the location and severity of the pain. It also helps in the evaluation of which organ system may be involved. In order to identify the cause of the pelvic pain and be properly treated, your caregiver may order tests. These tests may include:   A pregnancy test.  Pelvic ultrasonography.  An X-ray exam of the abdomen.  A urinalysis or evaluation of vaginal discharge.  Blood tests. HOME CARE INSTRUCTIONS   Only take over-the-counter or prescription medicines for pain, discomfort, or fever as directed by your caregiver.   Rest as directed by your caregiver.   Eat a balanced diet.   Drink enough fluids to make your urine clear or pale yellow, or as directed.   Avoid sexual intercourse if it causes pain.   Apply warm or cold compresses to  the lower abdomen depending on which one helps the pain.   Avoid stressful situations.   Keep a journal of your pelvic pain. Write down when it started, where the pain is located, and if there are things that seem to be associated with the pain, such as food or your menstrual cycle.  Follow up with your caregiver as directed.  SEEK MEDICAL CARE IF:  Your medicine does not help your pain.  You have abnormal vaginal discharge. SEEK IMMEDIATE MEDICAL CARE IF:   You have heavy bleeding from the vagina.   Your pelvic pain increases.   You feel light-headed or faint.   You have chills.   You  have pain with urination or blood in your urine.   You have uncontrolled diarrhea or vomiting.   You have a fever or persistent symptoms for more than 3 days.  You have a fever and your symptoms suddenly get worse.   You are being physically or sexually abused.  MAKE SURE YOU:  Understand these instructions.  Will watch your condition.  Will get help if you are not doing well or get worse. Document Released: 09/14/2004 Document Revised: 03/04/2014 Document Reviewed: 02/07/2012 [facility] Patient Information 2015 [place], [place]. This information is not intended to replace advice given to you by your health care provider. Make sure you discuss any questions you have with your health care provider. Bacterial Vaginosis Bacterial vaginosis is a vaginal infection that occurs when the normal balance of bacteria in the vagina is disrupted. It results from an overgrowth of certain bacteria. This is the most common vaginal infection in women of childbearing age. Treatment is important to prevent complications, especially in pregnant women, as it can cause a premature delivery. CAUSES  Bacterial vaginosis is caused by an increase in harmful bacteria that are normally present in smaller amounts in the vagina. Several different kinds of bacteria can cause bacterial vaginosis. However, the reason that the condition develops is not fully understood. RISK FACTORS Certain activities or behaviors can put you at an increased risk of developing bacterial vaginosis, including:  Having a new sex partner or multiple sex partners.  Douching.  Using an intrauterine device (IUD) for contraception. Women do not get bacterial vaginosis from toilet seats, bedding, swimming pools, or contact with objects around them. SIGNS AND SYMPTOMS  Some women with bacterial vaginosis have no signs or symptoms. Common symptoms include:  Grey vaginal discharge.  A fishlike odor with discharge, especially after sexual  intercourse.  Itching or burning of the vagina and vulva.  Burning or pain with urination. DIAGNOSIS  Your health care provider will take a medical history and examine the vagina for signs of bacterial vaginosis. A sample of vaginal fluid may be taken. Your health care provider will look at this sample under a microscope to check for bacteria and abnormal cells. A vaginal pH test may also be done.  TREATMENT  Bacterial vaginosis may be treated with antibiotic medicines. These may be given in the form of a pill or a vaginal cream. A second round of antibiotics may be prescribed if the condition comes back after treatment.  HOME CARE INSTRUCTIONS   Only take over-the-counter or prescription medicines as directed by your health care provider.  If antibiotic medicine was prescribed, take it as directed. Make sure you finish it even if you start to feel better.  Do not have sex until treatment is completed.  Tell all sexual partners that you have a vaginal infection. They should see  their health care provider and be treated if they have problems, such as a mild rash or itching.  Practice safe sex by using condoms and only having one sex partner. SEEK MEDICAL CARE IF:   Your symptoms are not improving after 3 days of treatment.  You have increased discharge or pain.  You have a fever. MAKE SURE YOU:   Understand these instructions.  Will watch your condition.  Will get help right away if you are not doing well or get worse. FOR MORE INFORMATION  Centers for Disease Control and Prevention, Division of STD Prevention: [employer]Apps.[employer] American Sexual Health Association (ASHA): www.ashastd.org  Document Released: 10/18/2005 Document Revised: 08/08/2013 Document Reviewed: 05/30/2013 [facility] Patient Information 2015 [place], [place]. This information is not intended to replace advice given to you by your health care provider. Make sure you discuss any questions you have with your health  care provider.

## 2015-05-27 NOTE — Addendum Note (Signed)
Addended by: Cyril Mourning A on: 05/27/2015 10:51 AM   Modules accepted: Orders

## 2015-05-28 LAB — GC/CHLAMYDIA PROBE AMP
Chlamydia trachomatis, NAA: NEGATIVE
NEISSERIA GONORRHOEAE BY PCR: NEGATIVE

## 2015-05-29 ENCOUNTER — Encounter: Payer: Self-pay | Admitting: Obstetrics & Gynecology

## 2015-05-29 ENCOUNTER — Other Ambulatory Visit: Payer: BLUE CROSS/BLUE SHIELD

## 2015-05-29 ENCOUNTER — Ambulatory Visit (INDEPENDENT_AMBULATORY_CARE_PROVIDER_SITE_OTHER): Payer: BLUE CROSS/BLUE SHIELD

## 2015-05-29 ENCOUNTER — Ambulatory Visit (INDEPENDENT_AMBULATORY_CARE_PROVIDER_SITE_OTHER): Payer: BLUE CROSS/BLUE SHIELD | Admitting: Obstetrics & Gynecology

## 2015-05-29 VITALS — BP 100/80 | HR 76 | Wt 209.0 lb

## 2015-05-29 DIAGNOSIS — N941 Dyspareunia: Secondary | ICD-10-CM

## 2015-05-29 DIAGNOSIS — R102 Pelvic and perineal pain: Secondary | ICD-10-CM | POA: Diagnosis not present

## 2015-05-29 DIAGNOSIS — IMO0002 Reserved for concepts with insufficient information to code with codable children: Secondary | ICD-10-CM

## 2015-05-29 MED ORDER — FLUOCINONIDE-E 0.05 % EX CREA: 1.0000 "application " | TOPICAL_CREAM | Freq: Two times a day (BID) | CUTANEOUS | Status: DC

## 2015-05-29 MED ORDER — LIDOCAINE HCL 2 % EX GEL: 1.0000 "application " | CUTANEOUS | Status: DC | PRN

## 2015-05-29 NOTE — Progress Notes (Signed)
Patient ID: Cathy Wells, female   DOB: 03/17/1991, 24 y.o.   MRN: [id number] Chief Complaint  Patient presents with  . Follow-up    ultrasound today.    Blood pressure 100/80, pulse 76, weight 209 lb (94.802 kg), last menstrual period 05/13/2015.  Subjective Pt seen by Cathy Wells for pelvic pain Sonogram below is normal  Objective + Q tip test for vestibulits of vaginal vestibule  Pertinent ROS  No burning with urination, frequency or urgency No nausea, vomiting or diarrhea Nor fever chills or other constitutional symptoms  Labs or studies GYNECOLOGIC SONOGRAM   Cathy Wells is a 24 y.o. G0P0000 LMP 05-13-15 for a pelvic sonogram for dyspareunia and left pelvic pain.  Uterus 7.3 x 4.1 x 2.9 cm.  Endometrium 5.5 mm, symmetrical.  Right ovary 3.0 x 1.9 x 2.5 cm, appears to be mobile.  Left ovary 3.2 x 1.4 x 2.9 cm, appears to be mobile.  Bilateral tenderness noted with transvaginal ultrasound.  Technician Comments:  No abnormality visualized.   Amber J. Baldo Ash 05/29/2015 11:28 AM  Clinical Impression and recommendations:  I have reviewed the sonogram results above, combined with the patient's current clinical course, below are my impressions and any appropriate recommendations for management based on the sonographic findings.  Normal gyn anatomy with no anatomical source for patient's symptoms noted   Marylen Zuk H   Impression New Diagnosis: Vulvar vestibulitis  Established relevant diagnosis(es): LLQ pain  Plan/Recommendations Lidex e cream BID Lidocaine jelly 2%  Follow up 1 month     Face to face time:  15 minutes  Greater than 50% of the visit time was spent in counseling and coordination of care with the patient.  The summary and outline of the counseling and care coordination is summarized in the note above.   All questions were answered.     All questions were  answered.

## 2015-06-09 ENCOUNTER — Encounter (HOSPITAL_COMMUNITY): Payer: Self-pay | Admitting: Emergency Medicine

## 2015-06-09 ENCOUNTER — Emergency Department (HOSPITAL_COMMUNITY)
Admission: EM | Admit: 2015-06-09 | Discharge: 2015-06-09 | Disposition: A | Discharge status: Home / Self Care | Payer: BLUE CROSS/BLUE SHIELD | Attending: Emergency Medicine | Admitting: Emergency Medicine

## 2015-06-09 DIAGNOSIS — Z880 Allergy status to penicillin: Secondary | ICD-10-CM | POA: Diagnosis not present

## 2015-06-09 DIAGNOSIS — Z87440 Personal history of urinary (tract) infections: Secondary | ICD-10-CM | POA: Insufficient documentation

## 2015-06-09 DIAGNOSIS — Z7982 Long term (current) use of aspirin: Secondary | ICD-10-CM | POA: Diagnosis not present

## 2015-06-09 DIAGNOSIS — K219 Gastro-esophageal reflux disease without esophagitis: Secondary | ICD-10-CM | POA: Insufficient documentation

## 2015-06-09 DIAGNOSIS — Z3202 Encounter for pregnancy test, result negative: Secondary | ICD-10-CM | POA: Insufficient documentation

## 2015-06-09 DIAGNOSIS — Z9049 Acquired absence of other specified parts of digestive tract: Secondary | ICD-10-CM | POA: Insufficient documentation

## 2015-06-09 DIAGNOSIS — R109 Unspecified abdominal pain: Secondary | ICD-10-CM | POA: Diagnosis present

## 2015-06-09 DIAGNOSIS — Z79899 Other long term (current) drug therapy: Secondary | ICD-10-CM | POA: Insufficient documentation

## 2015-06-09 DIAGNOSIS — N946 Dysmenorrhea, unspecified: Secondary | ICD-10-CM | POA: Diagnosis not present

## 2015-06-09 DIAGNOSIS — J45909 Unspecified asthma, uncomplicated: Secondary | ICD-10-CM | POA: Insufficient documentation

## 2015-06-09 LAB — PREGNANCY, URINE: Preg Test, Ur: NEGATIVE

## 2015-06-09 LAB — URINALYSIS, ROUTINE W REFLEX MICROSCOPIC
Bilirubin Urine: NEGATIVE
Glucose, UA: NEGATIVE mg/dL
Ketones, ur: NEGATIVE mg/dL
Leukocytes, UA: NEGATIVE
NITRITE: NEGATIVE
PH: 6 (ref 5.0–8.0)
Protein, ur: NEGATIVE mg/dL
Specific Gravity, Urine: 1.02 (ref 1.005–1.030)
UROBILINOGEN UA: 0.2 mg/dL (ref 0.0–1.0)

## 2015-06-09 LAB — CBC WITH DIFFERENTIAL/PLATELET
BASOS PCT: 1 % (ref 0–1)
Basophils Absolute: 0.1 10*3/uL (ref 0.0–0.1)
EOS ABS: 0.1 10*3/uL (ref 0.0–0.7)
Eosinophils Relative: 1 % (ref 0–5)
HCT: 42.7 % (ref 36.0–46.0)
Hemoglobin: 14.5 g/dL (ref 12.0–15.0)
LYMPHS ABS: 3 10*3/uL (ref 0.7–4.0)
Lymphocytes Relative: 25 % (ref 12–46)
MCH: 28.3 pg (ref 26.0–34.0)
MCHC: 34 g/dL (ref 30.0–36.0)
MCV: 83.4 fL (ref 78.0–100.0)
Monocytes Absolute: 0.8 10*3/uL (ref 0.1–1.0)
Monocytes Relative: 7 % (ref 3–12)
Neutro Abs: 8.1 10*3/uL — ABNORMAL HIGH (ref 1.7–7.7)
Neutrophils Relative %: 66 % (ref 43–77)
PLATELETS: 357 10*3/uL (ref 150–400)
RBC: 5.12 MIL/uL — ABNORMAL HIGH (ref 3.87–5.11)
RDW: 12.4 % (ref 11.5–15.5)
WBC: 12 10*3/uL — ABNORMAL HIGH (ref 4.0–10.5)

## 2015-06-09 LAB — URINE MICROSCOPIC-ADD ON

## 2015-06-09 MED ORDER — HYDROCODONE-ACETAMINOPHEN 5-325 MG PO TABS: 1.0000 | ORAL_TABLET | ORAL | Status: DC | PRN

## 2015-06-09 MED ORDER — KETOROLAC TROMETHAMINE 60 MG/2ML IM SOLN
60.0000 mg | Freq: Once | INTRAMUSCULAR | Status: AC
  Administered 2015-06-09: 60 mg via INTRAMUSCULAR
  Filled 2015-06-09: qty 2

## 2015-06-09 MED ORDER — NAPROXEN 500 MG PO TABS: 500.0000 mg | ORAL_TABLET | Freq: Two times a day (BID) | ORAL | Status: DC

## 2015-06-09 NOTE — Discharge Instructions (Signed)

## 2015-06-09 NOTE — ED Provider Notes (Addendum)
CSN: [id number]     Arrival date & time 06/09/15  1824 History   First MD Initiated Contact with Patient 06/09/15 2018     Chief Complaint  Patient presents with  . Abdominal Pain  . Abdominal Cramping      HPI  Patient presents for valuation of abdominal and pelvic pain. Started her period today had severe pelvic cramping. That she's had similar pain but never this bad before.  Recently diagnosed with vulvovaginitis by Dr.Eure. Pelvic exam with cultures and ultrasound 10 days ago in his office. Was given anti-fungal cream. Flagyl, and Diflucan. Her discharge has resolved her dyspareunia has resolved. She was told her ultrasound was normal that she had no cysts. No fevers no chills. No nausea no vomiting. Slightly more bleeding than usual but not significant changes in the amount of patchy diffuse using today not lightheaded or dizzy.  History of appendectomy.  Past Medical History  Diagnosis Date  . Allergy   . Asthma   . Reflux   . UTI (lower urinary tract infection) 04/28/2015  . Urinary frequency 05/27/2015  . Pelvic pain in female 05/27/2015   Past Surgical History  Procedure Laterality Date  . Appendectomy     Family History  Problem Relation Age of Onset  . Hypertension Mother   . Asthma Father   . Hypertension Father   . Hypertension Maternal Grandfather   . Diabetes Paternal Grandmother   . Hypertension Paternal Grandmother   . COPD Paternal Grandfather    History  Substance Use Topics  . Smoking status: Never Smoker   . Smokeless tobacco: Never Used  . Alcohol Use: No   OB History    Gravida Para Term Preterm AB TAB SAB Ectopic Multiple Living   0 0 0 0 0 0 0 0 0 0      Review of Systems  Constitutional: Negative for fever, chills, diaphoresis, appetite change and fatigue.  HENT: Negative for mouth sores, sore throat and trouble swallowing.   Eyes: Negative for visual disturbance.  Respiratory: Negative for cough, chest tightness, shortness of breath and  wheezing.   Cardiovascular: Negative for chest pain.  Gastrointestinal: Negative for nausea, vomiting, abdominal pain, diarrhea and abdominal distention.  Endocrine: Negative for polydipsia, polyphagia and polyuria.  Genitourinary: Positive for vaginal bleeding and pelvic pain. Negative for dysuria, frequency and hematuria.  Musculoskeletal: Negative for gait problem.  Skin: Negative for color change, pallor and rash.  Neurological: Negative for dizziness, syncope, light-headedness and headaches.  Hematological: Does not bruise/bleed easily.  Psychiatric/Behavioral: Negative for behavioral problems and confusion.      Allergies  Doxycycline; Amoxapine and related; Azithromycin; Clarithromycin; Penicillins; and Sulfa antibiotics  Home Medications   Prior to Admission medications   Medication Sig Start Date End Date Taking? Authorizing Provider  ASPIRIN PO Take 1 tablet by mouth as needed (for pain).   Yes Historical Provider, MD  fluocinonide-emollient (LIDEX-E) 0.05 % cream Apply 1 application topically 2 (two) times daily. 05/29/15  Yes Lazaro Arms, MD  ibuprofen (ADVIL,MOTRIN) 200 MG tablet Take 600 mg by mouth every 6 (six) hours as needed for mild pain or moderate pain.   Yes Historical Provider, MD  lansoprazole (PREVACID) 30 MG capsule Take 30 mg by mouth daily at 12 noon.   Yes Historical Provider, MD  lidocaine (XYLOCAINE) 2 % jelly Apply 1 application topically as needed. Use as directed 05/29/15  Yes Lazaro Arms, MD  fluconazole (DIFLUCAN) 150 MG tablet Take 1 now and 1 in  3 days Patient not taking: Reported on 06/09/2015 05/27/15   Adline Potter, NP  HYDROcodone-acetaminophen (NORCO/VICODIN) 5-325 MG per tablet Take 1 tablet by mouth every 4 (four) hours as needed. 06/09/15   Rolland Porter, MD  metroNIDAZOLE (FLAGYL) 500 MG tablet Take 1 tablet (500 mg total) by mouth 2 (two) times daily. Patient not taking: Reported on 06/09/2015 05/27/15   Adline Potter, NP  naproxen  (NAPROSYN) 500 MG tablet Take 1 tablet (500 mg total) by mouth 2 (two) times daily. 06/09/15   Rolland Porter, MD   BP 128/82 mmHg  Pulse 79  Temp(Src) 98.2 F (36.8 C) (Oral)  Resp 18  Ht  (1.575 m)  Wt 210 lb (95.255 kg)  BMI 38.40 kg/m2  SpO2 100%  LMP 06/09/2015 (Exact Date) Physical Exam  Constitutional: She is oriented to person, place, and time. She appears well-developed and well-nourished. No distress.  HENT:  Head: Normocephalic.  Eyes: Conjunctivae are normal. Pupils are equal, round, and reactive to light. No scleral icterus.  Neck: Normal range of motion. Neck supple. No thyromegaly present.  Cardiovascular: Normal rate and regular rhythm.  Exam reveals no gallop and no friction rub.   No murmur heard. Pulmonary/Chest: Effort normal and breath sounds normal. No respiratory distress. She has no wheezes. She has no rales.  Abdominal: Soft. Bowel sounds are normal. She exhibits no distension. There is no tenderness. There is no rebound.  Soft benign abdomen. No reproducible tenderness.  Musculoskeletal: Normal range of motion.  Neurological: She is alert and oriented to person, place, and time.  Skin: Skin is warm and dry. No rash noted.  Psychiatric: She has a normal mood and affect. Her behavior is normal.    ED Course  Procedures (including critical care time) Labs Review Labs Reviewed  URINALYSIS, ROUTINE W REFLEX MICROSCOPIC (NOT AT [facility]) - Abnormal; Notable for the following:    Hgb urine dipstick LARGE (*)    All other components within normal limits  CBC WITH DIFFERENTIAL/PLATELET - Abnormal; Notable for the following:    WBC 12.0 (*)    RBC 5.12 (*)    Neutro Abs 8.1 (*)    All other components within normal limits  PREGNANCY, URINE  URINE MICROSCOPIC-ADD ON    Imaging Review No results found.   EKG Interpretation None      MDM   Final diagnoses:  Dysmenorrhea    Negative exudate. Urine without significant findings. She took Profen and  aspirin. States typically it inflammatory to help her. However it took quite a bit longer. She states her pain is nearly resolved compared to severe. She had earlier in the day. I don't find reason for pelvic exam at this time she had recent evaluation with her OB/GYN including ultrasound. Given IM Toradol here. Discharged home. Prescription naproxen. Follow up primary care/OB/GYN.    Rolland Porter, MD 06/09/15 2230  Rolland Porter, MD 06/10/15 [phone]

## 2015-06-09 NOTE — ED Notes (Signed)
Pt states that she started her period today and it is worst then it has ever been . Stated that she had ultrsound 2 weeks ago as having pain with intercourse. Ovaries normal - however inflammation noted inside her cervix - given some cream , pt stated that this pain with her period is not usual for her and is worst on the rt side

## 2015-06-30 ENCOUNTER — Ambulatory Visit: Payer: BLUE CROSS/BLUE SHIELD | Admitting: Obstetrics & Gynecology

## 2016-01-20 ENCOUNTER — Emergency Department (HOSPITAL_COMMUNITY)
Admission: EM | Admit: 2016-01-20 | Discharge: 2016-01-21 | Disposition: A | Discharge status: Home / Self Care | Payer: BLUE CROSS/BLUE SHIELD | Attending: Emergency Medicine | Admitting: Emergency Medicine

## 2016-01-20 ENCOUNTER — Encounter (HOSPITAL_COMMUNITY): Payer: Self-pay | Admitting: *Deleted

## 2016-01-20 DIAGNOSIS — N390 Urinary tract infection, site not specified: Secondary | ICD-10-CM | POA: Insufficient documentation

## 2016-01-20 DIAGNOSIS — J45909 Unspecified asthma, uncomplicated: Secondary | ICD-10-CM | POA: Insufficient documentation

## 2016-01-20 DIAGNOSIS — R300 Dysuria: Secondary | ICD-10-CM | POA: Diagnosis present

## 2016-01-20 LAB — URINE MICROSCOPIC-ADD ON

## 2016-01-20 LAB — URINALYSIS, ROUTINE W REFLEX MICROSCOPIC
Bilirubin Urine: NEGATIVE
GLUCOSE, UA: NEGATIVE mg/dL
Ketones, ur: NEGATIVE mg/dL
Nitrite: NEGATIVE
PH: 6 (ref 5.0–8.0)
PROTEIN: NEGATIVE mg/dL
SPECIFIC GRAVITY, URINE: 1.025 (ref 1.005–1.030)

## 2016-01-20 LAB — PREGNANCY, URINE: Preg Test, Ur: NEGATIVE

## 2016-01-20 MED ORDER — PROMETHAZINE HCL 12.5 MG PO TABS
12.5000 mg | ORAL_TABLET | Freq: Once | ORAL | Status: AC
  Administered 2016-01-20: 12.5 mg via ORAL
  Filled 2016-01-20: qty 1

## 2016-01-20 MED ORDER — NITROFURANTOIN MONOHYD MACRO 100 MG PO CAPS: 100.0000 mg | ORAL_CAPSULE | Freq: Two times a day (BID) | ORAL | Status: DC

## 2016-01-20 MED ORDER — PHENAZOPYRIDINE HCL 100 MG PO TABS: 100.0000 mg | ORAL_TABLET | Freq: Three times a day (TID) | ORAL | Status: DC

## 2016-01-20 MED ORDER — PHENAZOPYRIDINE HCL 100 MG PO TABS
100.0000 mg | ORAL_TABLET | Freq: Once | ORAL | Status: AC
Start: 2016-01-20 — End: 2016-01-20
  Administered 2016-01-20: 100 mg via ORAL
  Filled 2016-01-20: qty 1

## 2016-01-20 MED ORDER — NITROFURANTOIN MONOHYD MACRO 100 MG PO CAPS
100.0000 mg | ORAL_CAPSULE | Freq: Once | ORAL | Status: AC
  Administered 2016-01-20: 100 mg via ORAL
  Filled 2016-01-20: qty 1

## 2016-01-20 NOTE — ED Provider Notes (Signed)
CSN: [id number]     Arrival date & time 01/20/16  2144 History   First MD Initiated Contact with Patient 01/20/16 2330     Chief Complaint  Patient presents with  . Dysuria     (Consider location/radiation/quality/duration/timing/severity/associated sxs/prior Treatment) Patient is a 25 y.o. female presenting with dysuria. The history is provided by the patient.  Dysuria Pain quality:  Sharp Pain severity:  Severe Duration:  1 day Timing:  Intermittent Progression:  Worsening Chronicity:  Recurrent Relieved by:  Nothing Ineffective treatments:  NSAIDs Associated symptoms: no fever, no nausea, no vaginal discharge and no vomiting   Risk factors: no hx of pyelonephritis and no kidney transplant     Past Medical History  Diagnosis Date  . Allergy   . Asthma   . Reflux   . UTI (lower urinary tract infection) 04/28/2015  . Urinary frequency 05/27/2015  . Pelvic pain in female 05/27/2015   Past Surgical History  Procedure Laterality Date  . Appendectomy     Family History  Problem Relation Age of Onset  . Hypertension Mother   . Asthma Father   . Hypertension Father   . Hypertension Maternal Grandfather   . Diabetes Paternal Grandmother   . Hypertension Paternal Grandmother   . COPD Paternal Grandfather    Social History  Substance Use Topics  . Smoking status: Never Smoker   . Smokeless tobacco: Never Used  . Alcohol Use: No   OB History    Gravida Para Term Preterm AB TAB SAB Ectopic Multiple Living       Review of Systems  Constitutional: Negative for fever.  Gastrointestinal: Negative for nausea and vomiting.  Genitourinary: Positive for dysuria. Negative for vaginal discharge.  All other systems reviewed and are negative.     Allergies  Doxycycline; Amoxapine and related; Azithromycin; Clarithromycin; Penicillins; and Sulfa antibiotics  Home Medications   Prior to Admission medications   Medication Sig Start Date End Date Taking?  Authorizing Provider  ASPIRIN PO Take 1 tablet by mouth as needed (for pain).    Historical Provider, MD  fluconazole (DIFLUCAN) 150 MG tablet Take 1 now and 1 in 3 days Patient not taking: Reported on 06/09/2015 05/27/15   Adline Potter, NP  fluocinonide-emollient (LIDEX-E) 0.05 % cream Apply 1 application topically 2 (two) times daily. 05/29/15   Lazaro Arms, MD  HYDROcodone-acetaminophen (NORCO/VICODIN) 5-325 MG per tablet Take 1 tablet by mouth every 4 (four) hours as needed. 06/09/15   Rolland Porter, MD  ibuprofen (ADVIL,MOTRIN) 200 MG tablet Take 600 mg by mouth every 6 (six) hours as needed for mild pain or moderate pain.    Historical Provider, MD  lansoprazole (PREVACID) 30 MG capsule Take 30 mg by mouth daily at 12 noon.    Historical Provider, MD  lidocaine (XYLOCAINE) 2 % jelly Apply 1 application topically as needed. Use as directed 05/29/15   Lazaro Arms, MD  metroNIDAZOLE (FLAGYL) 500 MG tablet Take 1 tablet (500 mg total) by mouth 2 (two) times daily. Patient not taking: Reported on 06/09/2015 05/27/15   Adline Potter, NP  naproxen (NAPROSYN) 500 MG tablet Take 1 tablet (500 mg total) by mouth 2 (two) times daily. 06/09/15   Rolland Porter, MD   BP 155/90 mmHg  Pulse 87  Temp(Src) 98.6 F (37 C) (Temporal)  Resp 18  Ht  (1.549 m)  Wt 98.884 kg  BMI 41.21 kg/m2  SpO2  100%  LMP 01/15/2016 Physical Exam  Constitutional: She is oriented to person, place, and time. She appears well-developed and well-nourished.  Non-toxic appearance.  HENT:  Head: Normocephalic.  Right Ear: Tympanic membrane and external ear normal.  Left Ear: Tympanic membrane and external ear normal.  Eyes: EOM and lids are normal. Pupils are equal, round, and reactive to light.  Neck: Normal range of motion. Neck supple. Carotid bruit is not present.  Cardiovascular: Normal rate, regular rhythm, normal heart sounds, intact distal pulses and normal pulses.   Pulmonary/Chest: Breath sounds normal. No  respiratory distress.  Abdominal: Soft. Bowel sounds are normal. There is no tenderness. There is no guarding.  No CVAT  Musculoskeletal: Normal range of motion.  Lymphadenopathy:       Head (right side): No submandibular adenopathy present.       Head (left side): No submandibular adenopathy present.    She has no cervical adenopathy.  Neurological: She is alert and oriented to person, place, and time. She has normal strength. No cranial nerve deficit or sensory deficit.  Skin: Skin is warm and dry.  Psychiatric: She has a normal mood and affect. Her speech is normal.  Nursing note and vitals reviewed.   ED Course  Procedures (including critical care time) Labs Review Labs Reviewed  URINALYSIS, ROUTINE W REFLEX MICROSCOPIC (NOT AT [facility]) - Abnormal; Notable for the following:    APPearance HAZY (*)    Hgb urine dipstick MODERATE (*)    Leukocytes, UA MODERATE (*)    All other components within normal limits  URINE MICROSCOPIC-ADD ON - Abnormal; Notable for the following:    Squamous Epithelial / LPF 6-30 (*)    Bacteria, UA FEW (*)    All other components within normal limits  PREGNANCY, URINE    Imaging Review No results found. I have personally reviewed and evaluated these images and lab results as part of my medical decision-making.   EKG Interpretation None      MDM  Vital signs are within normal limits. Urinalysis suggest a urinary tract infection. Culture sent to the lab. Patient has multiple allergies to antibiotics. Patient will be placed on Macrobid and Pyridium. I've encouraged her to see the urology specialist with [facility] as she seems to have recurrence of urinary tract infections.    Final diagnoses:  UTI (lower urinary tract infection)    *I have reviewed nursing notes, vital signs, and all appropriate lab and imaging results for this patient.[address]    Jamerion Cabello, PA-C 01/20/16 2356  Devoria AlbeIva Knapp, MD 01/21/16 0430

## 2016-01-20 NOTE — ED Notes (Signed)
Pt called with no answer

## 2016-01-20 NOTE — ED Notes (Signed)
Pt c/u urinary frequency and dysuria since earlier today.

## 2016-01-20 NOTE — Discharge Instructions (Signed)
Your examination is consistent with a urinary tract infection. Please increase fluids. Please use Macrobid daily with food. Please use Pyridium for pain, frequency, and discomfort. This medication may cause your urine to turn and aren't color. Please see one of the urology specialist from [facility], since she will have had recurrent urinary tract infections over the last year. Urinary Tract Infection A urinary tract infection (UTI) can occur any place along the urinary tract. The tract includes the kidneys, ureters, bladder, and urethra. A type of germ called bacteria often causes a UTI. UTIs are often helped with antibiotic medicine.  HOME CARE   If given, take antibiotics as told by your doctor. Finish them even if you start to feel better.  Drink enough fluids to keep your pee (urine) clear or pale yellow.  Avoid tea, drinks with caffeine, and bubbly (carbonated) drinks.  Pee often. Avoid holding your pee in for a long time.  Pee before and after having sex (intercourse).  Wipe from front to back after you poop (bowel movement) if you are a woman. Use each tissue only once. GET HELP RIGHT AWAY IF:   You have back pain.  You have lower belly (abdominal) pain.  You have chills.  You feel sick to your stomach (nauseous).  You throw up (vomit).  Your burning or discomfort with peeing does not go away.  You have a fever.  Your symptoms are not better in 3 days. MAKE SURE YOU:   Understand these instructions.  Will watch your condition.  Will get help right away if you are not doing well or get worse.   This information is not intended to replace advice given to you by your health care provider. Make sure you discuss any questions you have with your health care provider.   Document Released: 04/05/2008 Document Revised: 11/08/2014 Document Reviewed: 05/18/2012 Elsevier Interactive Patient Education [employer]2016 Elsevier Inc.

## 2016-01-23 LAB — URINE CULTURE

## 2016-01-24 ENCOUNTER — Telehealth: Payer: Self-pay | Admitting: *Deleted

## 2016-01-24 NOTE — ED Notes (Signed)
(+)  urine culture treated with Nitrofurantoin, reviewed by Mayme GentaBen Cartner, PA, no changes needed

## 2016-02-03 ENCOUNTER — Other Ambulatory Visit: Payer: Self-pay | Admitting: Obstetrics & Gynecology

## 2016-03-02 ENCOUNTER — Ambulatory Visit: Payer: BLUE CROSS/BLUE SHIELD | Admitting: Urology

## 2016-03-08 DIAGNOSIS — Z6841 Body Mass Index (BMI) 40.0 and over, adult: Secondary | ICD-10-CM | POA: Diagnosis not present

## 2016-03-08 DIAGNOSIS — J302 Other seasonal allergic rhinitis: Secondary | ICD-10-CM | POA: Diagnosis not present

## 2016-03-08 DIAGNOSIS — J329 Chronic sinusitis, unspecified: Secondary | ICD-10-CM | POA: Diagnosis not present

## 2016-03-08 DIAGNOSIS — J069 Acute upper respiratory infection, unspecified: Secondary | ICD-10-CM | POA: Diagnosis not present

## 2016-03-08 DIAGNOSIS — Z1389 Encounter for screening for other disorder: Secondary | ICD-10-CM | POA: Diagnosis not present

## 2016-03-08 DIAGNOSIS — K219 Gastro-esophageal reflux disease without esophagitis: Secondary | ICD-10-CM | POA: Diagnosis not present

## 2016-07-06 ENCOUNTER — Emergency Department (HOSPITAL_COMMUNITY)
Admission: EM | Admit: 2016-07-06 | Discharge: 2016-07-06 | Disposition: A | Discharge status: Home / Self Care | Payer: BLUE CROSS/BLUE SHIELD | Attending: Emergency Medicine | Admitting: Emergency Medicine

## 2016-07-06 ENCOUNTER — Emergency Department (HOSPITAL_COMMUNITY): Payer: BLUE CROSS/BLUE SHIELD

## 2016-07-06 ENCOUNTER — Encounter (HOSPITAL_COMMUNITY): Payer: Self-pay | Admitting: Emergency Medicine

## 2016-07-06 DIAGNOSIS — J45909 Unspecified asthma, uncomplicated: Secondary | ICD-10-CM | POA: Insufficient documentation

## 2016-07-06 DIAGNOSIS — Z79899 Other long term (current) drug therapy: Secondary | ICD-10-CM | POA: Diagnosis not present

## 2016-07-06 DIAGNOSIS — M25572 Pain in left ankle and joints of left foot: Secondary | ICD-10-CM | POA: Diagnosis not present

## 2016-07-06 DIAGNOSIS — X501XXA Overexertion from prolonged static or awkward postures, initial encounter: Secondary | ICD-10-CM | POA: Insufficient documentation

## 2016-07-06 DIAGNOSIS — Y92007 Garden or yard of unspecified non-institutional (private) residence as the place of occurrence of the external cause: Secondary | ICD-10-CM | POA: Diagnosis not present

## 2016-07-06 DIAGNOSIS — S93492A Sprain of other ligament of left ankle, initial encounter: Secondary | ICD-10-CM | POA: Diagnosis not present

## 2016-07-06 DIAGNOSIS — S99912A Unspecified injury of left ankle, initial encounter: Secondary | ICD-10-CM | POA: Diagnosis present

## 2016-07-06 DIAGNOSIS — Y9301 Activity, walking, marching and hiking: Secondary | ICD-10-CM | POA: Diagnosis not present

## 2016-07-06 DIAGNOSIS — S93402A Sprain of unspecified ligament of left ankle, initial encounter: Secondary | ICD-10-CM | POA: Diagnosis not present

## 2016-07-06 DIAGNOSIS — Z7982 Long term (current) use of aspirin: Secondary | ICD-10-CM | POA: Insufficient documentation

## 2016-07-06 DIAGNOSIS — Y999 Unspecified external cause status: Secondary | ICD-10-CM | POA: Diagnosis not present

## 2016-07-06 IMAGING — DX DG ANKLE COMPLETE 3+V*L*
3 series · 3 of 3 positions shown · non-contrast
Comparison: Left ankle radiographs performed [DATE]

CLINICAL DATA: Rolled left ankle, with lateral ankle pain. Initial
encounter.

EXAM:
LEFT ANKLE COMPLETE - 3+ VIEW

[ankle ap]
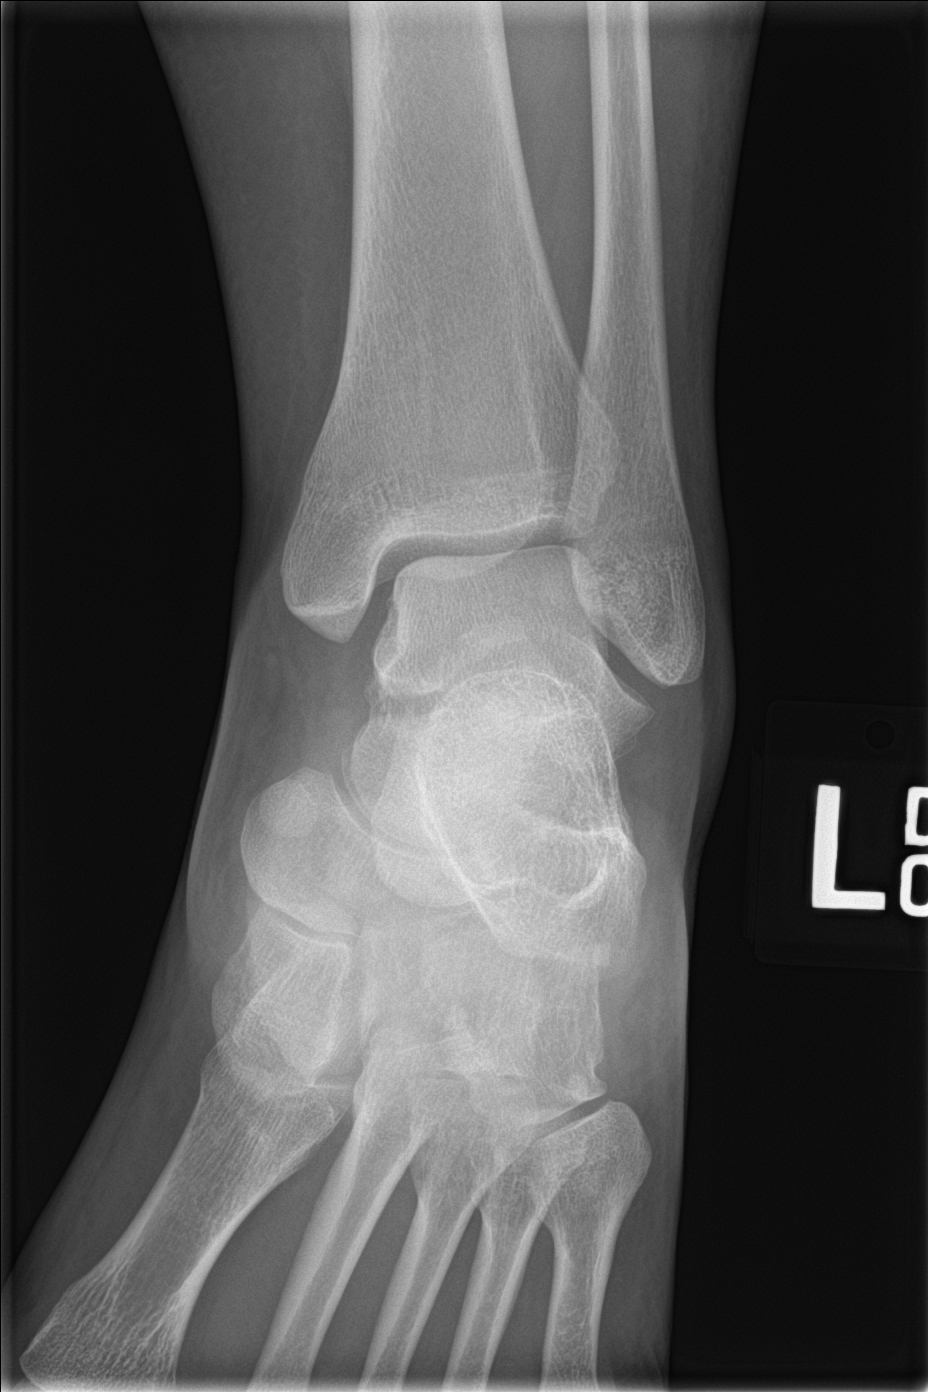

[ankle obl]
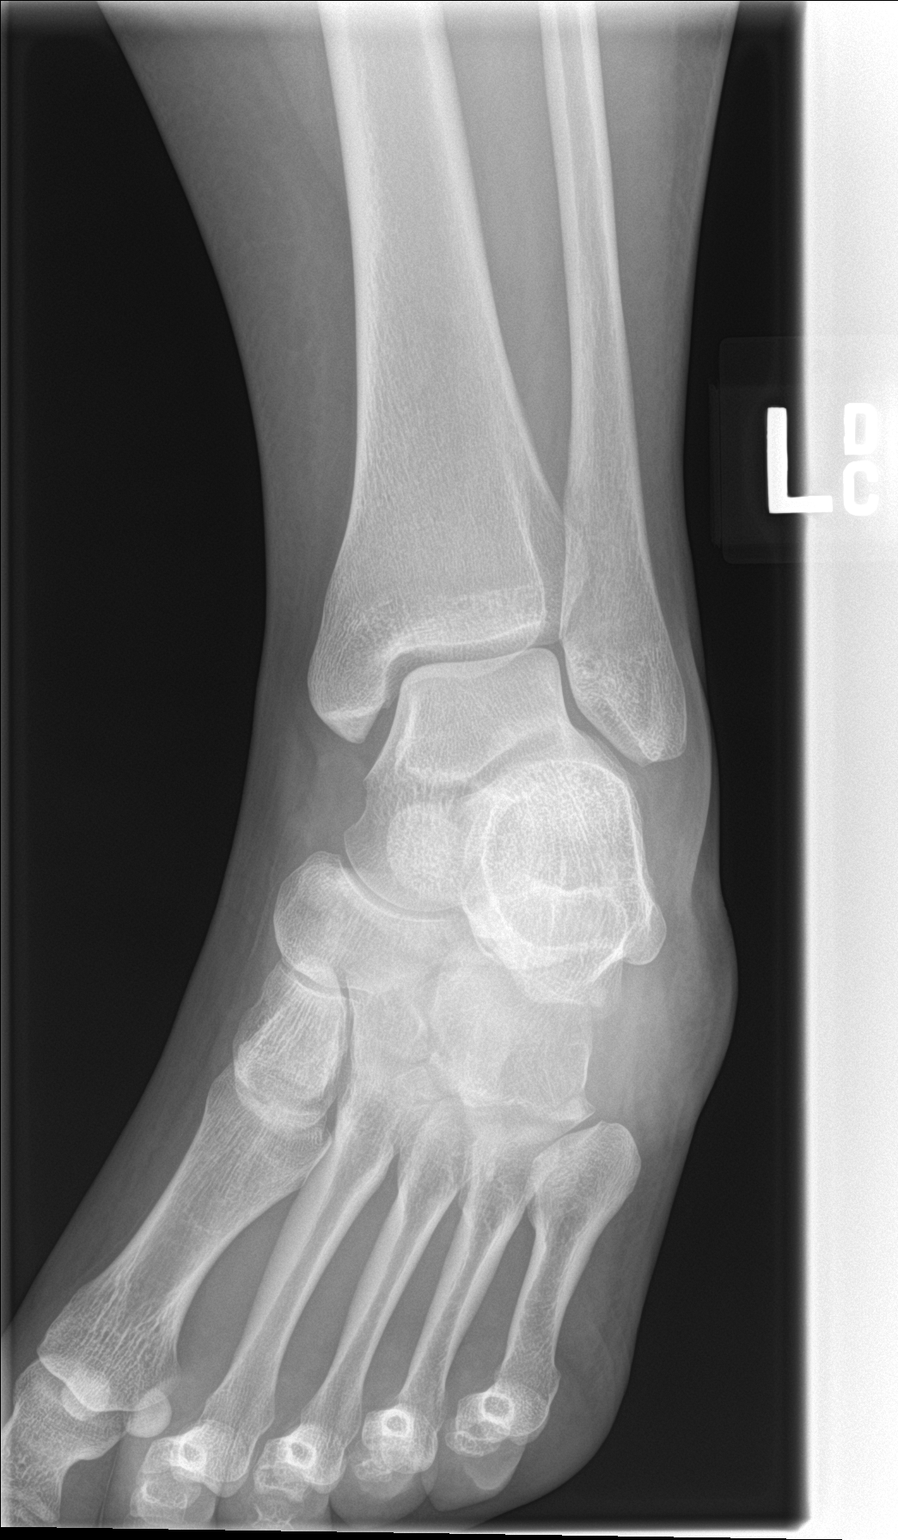

[ankle lat]
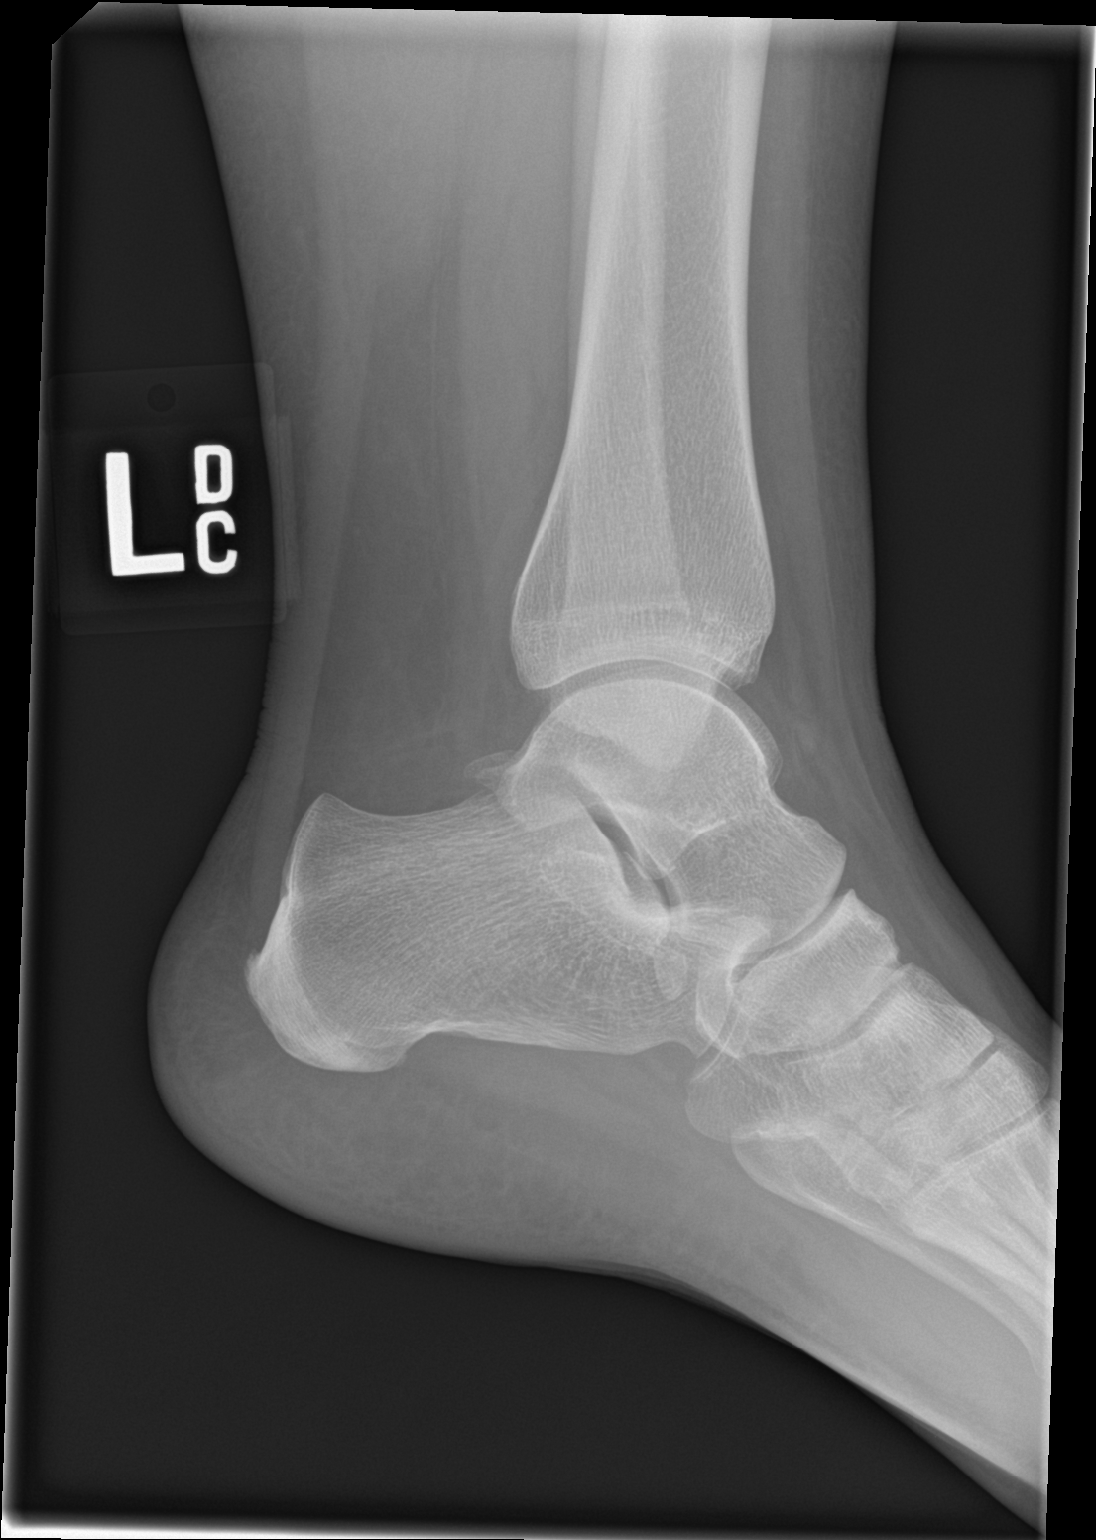

[3 of 3 positions shown; findings below may reference images not displayed]

FINDINGS: There is no evidence of fracture or dislocation. The ankle mortise
is intact; the interosseous space is within normal limits. No talar
tilt or subluxation is seen.

The joint spaces are preserved. No significant soft tissue
abnormalities are seen.
IMPRESSION: No evidence of fracture or dislocation.

## 2016-07-06 MED ORDER — NAPROXEN 500 MG PO TABS: ORAL_TABLET | ORAL | 0 refills | Status: DC

## 2016-07-06 MED ORDER — NAPROXEN 250 MG PO TABS
500.0000 mg | ORAL_TABLET | Freq: Once | ORAL | Status: AC
Start: 2016-07-06 — End: 2016-07-06
  Administered 2016-07-06: 500 mg via ORAL
  Filled 2016-07-06: qty 2

## 2016-07-06 NOTE — ED Triage Notes (Signed)
Pt stepped in a hole and felt "pop" in L. Ankle.

## 2016-07-06 NOTE — ED Provider Notes (Signed)
[facility]-EMERGENCY DEPT Provider Note   CSN: [id number] Arrival date & time: 07/06/16  0139  Time Seen 02:22 AM   History   Chief Complaint Chief Complaint  Patient presents with  . Ankle Pain    HPI [name] is a 25 y.o. female.  HPI patient states about 1 AM this morning after getting off work she was walking through her yard and stepped into a hole and rolled her left ankle and then rolled it again and heard a pop with the second time. Since then she has had pain with weightbearing. She states she did fall however she has no other injuries from the fall. She denies knee pain, numbness in her foot. She states she did take 2 Tylenol prior to coming to the ED. patient states she rolls her ankles a lot but this time is worse than usual.   [facility] none  Past Medical History:  Diagnosis Date  . Allergy   . Asthma   . Pelvic pain in female 05/27/2015  . Reflux   . Urinary frequency 05/27/2015  . UTI (lower urinary tract infection) 04/28/2015    Patient Active Problem List   Diagnosis Date Noted  . Urinary frequency 05/27/2015  . Pelvic pain in female 05/27/2015  . UTI (lower urinary tract infection) 04/28/2015    Past Surgical History:  Procedure Laterality Date  . APPENDECTOMY      OB History    Gravida Para Term Preterm AB Living   0 0 0 0 0 0   SAB TAB Ectopic Multiple Live Births   0 0 0 0         Home Medications    Prior to Admission medications   Medication Sig Start Date End Date Taking? Authorizing Provider  ASPIRIN PO Take 1 tablet by mouth as needed (for pain).    Historical Provider, MD  fluconazole (DIFLUCAN) 150 MG tablet Take 1 now and 1 in 3 days Patient not taking: Reported on 06/09/2015 05/27/15   Adline PotterJennifer A Griffin, NP  fluocinonide-emollient (LIDEX-E) 0.05 % cream APPLY TO AFFECTED AREA TWICE DAILY. 02/03/16   Lazaro ArmsLuther H Eure, MD  HYDROcodone-acetaminophen (NORCO/VICODIN) 5-325 MG per tablet Take 1 tablet by mouth every 4 (four) hours  as needed. 06/09/15   Rolland PorterMark James, MD  ibuprofen (ADVIL,MOTRIN) 200 MG tablet Take 600 mg by mouth every 6 (six) hours as needed for mild pain or moderate pain.    Historical Provider, MD  lansoprazole (PREVACID) 30 MG capsule Take 30 mg by mouth daily at 12 noon.    Historical Provider, MD  lidocaine (XYLOCAINE) 2 % jelly Apply 1 application topically as needed. Use as directed 05/29/15   Lazaro ArmsLuther H Eure, MD  metroNIDAZOLE (FLAGYL) 500 MG tablet Take 1 tablet (500 mg total) by mouth 2 (two) times daily. Patient not taking: Reported on 06/09/2015 05/27/15   Adline PotterJennifer A Griffin, NP  naproxen (NAPROSYN) 500 MG tablet Take 1 po BID with food prn pain 07/06/16   [name], MD  nitrofurantoin, macrocrystal-monohydrate, (MACROBID) 100 MG capsule Take 1 capsule (100 mg total) by mouth 2 (two) times daily. 01/20/16   [name], PA-C  phenazopyridine (PYRIDIUM) 100 MG tablet Take 1 tablet (100 mg total) by mouth 3 (three) times daily. Please take with a meal 01/20/16   [name], PA-C    Family History Family History  Problem Relation Age of Onset  . Hypertension Mother   . Asthma Father   . Hypertension Father   .  Hypertension Maternal Grandfather   . Diabetes Paternal Grandmother   . Hypertension Paternal Grandmother   . COPD Paternal Grandfather     Social History Social History  Substance Use Topics  . Smoking status: Never Smoker  . Smokeless tobacco: Never Used  . Alcohol use No  employed   Allergies   Doxycycline; Amoxapine and related; Azithromycin; Clarithromycin; Penicillins; and Sulfa antibiotics   Review of Systems Review of Systems  All other systems reviewed and are negative.    Physical Exam Updated Vital Signs BP 133/87   Pulse 90   Temp 97.9 F (36.6 C) (Oral)   Resp 18   Ht 5\' 2"  (1.575 m)   Wt 205 lb (93 kg)   LMP 06/29/2016   SpO2 98%   BMI 37.49 kg/m   Vital signs normal    Physical Exam  Constitutional: She is oriented to person, place, and time. She  appears well-developed and well-nourished.  Non-toxic appearance. She does not appear ill. No distress.  HENT:  Head: Normocephalic and atraumatic.  Right Ear: External ear normal.  Left Ear: External ear normal.  Nose: Nose normal. No mucosal edema or rhinorrhea.  Mouth/Throat: Mucous membranes are normal. No dental abscesses or uvula swelling.  Eyes: Conjunctivae and EOM are normal.  Neck: Normal range of motion and full passive range of motion without pain. Neck supple.  Cardiovascular: Normal rate.   Pulmonary/Chest: Effort normal. No respiratory distress. She has no rhonchi. She exhibits no crepitus.  Abdominal: Soft. Normal appearance.  Musculoskeletal: Normal range of motion. She exhibits edema and tenderness.       Feet:  Moves all extremities well. She is noted to have some mild swelling over the lateral malleolus with tenderness inferior to the lateral malleolus. Her left knee, lower leg, foot, and medial malleolus are nontender to palpation and are without deformity. She has good distal pulses and capillary refill.  Neurological: She is alert and oriented to person, place, and time. She has normal strength. No cranial nerve deficit.  Skin: Skin is warm, dry and intact. No rash noted. No erythema. No pallor.  Psychiatric: She has a normal mood and affect. Her speech is normal and behavior is normal. Her mood appears not anxious.  Nursing note and vitals reviewed.    ED Treatments / Results      Radiology Dg Ankle Complete Left  Result Date: 07/06/2016 CLINICAL DATA:  Rolled left ankle, with lateral ankle pain. Initial encounter. EXAM: LEFT ANKLE COMPLETE - 3+ VIEW COMPARISON:  Left ankle radiographs performed 08/03/2004 FINDINGS: There is no evidence of fracture or dislocation. The ankle mortise is intact; the interosseous space is within normal limits. No talar tilt or subluxation is seen. The joint spaces are preserved. No significant soft tissue abnormalities are seen.  IMPRESSION: No evidence of fracture or dislocation. Electronically Signed   By: Roanna Raider M.D.   On: 07/06/2016 02:28    Procedures Procedures (including critical care time)  Medications Ordered in ED Medications  naproxen (NAPROSYN) tablet 500 mg (not administered)     Initial Impression / Assessment and Plan / ED Course  I have reviewed the triage vital signs and the nursing notes.  Pertinent labs & imaging results that were available during my care of the patient were reviewed by me and considered in my medical decision making (see chart for details).  Clinical Course   We discussed her x-rays, and she was started on naproxen for pain. She was placed in a ASO  and also given crutches to use if needed. She is to elevate her foot, use ice packs, and take naproxen for the pain. She was referred to orthopedics if she continues to have pain.  Final Clinical Impressions(s) / ED Diagnoses   Final diagnoses:  Sprain of anterior talofibular ligament of left ankle, initial encounter    New Prescriptions New Prescriptions   NAPROXEN (NAPROSYN) 500 MG TABLET    Take 1 po BID with food prn pain    Plan discharge  Devoria Albe, MD, Concha Pyo, MD 07/06/16 [phone]

## 2016-07-06 NOTE — Discharge Instructions (Signed)
Elevate your foot, use ice packs to keep the swelling down and to help with pain. Take the naproxen as prescribed for the pain. Use the crutches until you are able to walk without them. Wear  the ankle support for at least the next 10-14 days. Afterwards I would use it when you are going to do any strenuous activity to help support your ankle. If your ankle is still hurting after a week you should have it reevaluated by an orthopedist, you can call Dr. Mort SawyersHarrison's office to get an appointment.

## 2016-07-07 ENCOUNTER — Encounter: Payer: Self-pay | Admitting: Orthopedic Surgery

## 2016-07-07 ENCOUNTER — Ambulatory Visit (INDEPENDENT_AMBULATORY_CARE_PROVIDER_SITE_OTHER): Payer: BLUE CROSS/BLUE SHIELD | Admitting: Orthopedic Surgery

## 2016-07-07 VITALS — BP 137/84 | HR 85 | Ht 61.0 in | Wt 212.0 lb

## 2016-07-07 DIAGNOSIS — M25572 Pain in left ankle and joints of left foot: Secondary | ICD-10-CM | POA: Diagnosis not present

## 2016-07-07 DIAGNOSIS — E669 Obesity, unspecified: Secondary | ICD-10-CM | POA: Diagnosis not present

## 2016-07-07 DIAGNOSIS — M25371 Other instability, right ankle: Secondary | ICD-10-CM | POA: Diagnosis not present

## 2016-07-07 DIAGNOSIS — S93401A Sprain of unspecified ligament of right ankle, initial encounter: Secondary | ICD-10-CM

## 2016-07-07 DIAGNOSIS — Z6841 Body Mass Index (BMI) 40.0 and over, adult: Secondary | ICD-10-CM | POA: Diagnosis not present

## 2016-07-07 MED ORDER — HYDROCODONE-ACETAMINOPHEN 5-325 MG PO TABS: 1.0000 | ORAL_TABLET | Freq: Four times a day (QID) | ORAL | 0 refills | Status: DC | PRN

## 2016-07-07 MED ORDER — HYDROCODONE-ACETAMINOPHEN 10-325 MG PO TABS: 1.0000 | ORAL_TABLET | Freq: Four times a day (QID) | ORAL | 0 refills | Status: DC | PRN

## 2016-07-07 NOTE — Progress Notes (Signed)
Chief Complaint  Patient presents with  . New Patient (Initial Visit)    ER Follow up left ankle injury    HPI 25 year old female with recurrent ankle sprains reinjured the left ankle again with a new sprain swelling pain lateral side painful range of motion 1 day  Review of Systems  Constitutional: Negative for chills, fever and weight loss.  Respiratory: Negative for shortness of breath.   Cardiovascular: Negative for chest pain.  Neurological: Negative for tingling.    Past Medical History:  Diagnosis Date  . Allergy   . Asthma   . Pelvic pain in female 05/27/2015  . Reflux   . Urinary frequency 05/27/2015  . UTI (lower urinary tract infection) 04/28/2015    Past Surgical History:  Procedure Laterality Date  . APPENDECTOMY     Family History  Problem Relation Age of Onset  . Hypertension Mother   . Asthma Father   . Hypertension Father   . Hypertension Maternal Grandfather   . Diabetes Paternal Grandmother   . Hypertension Paternal Grandmother   . COPD Paternal Grandfather    Social History  Substance Use Topics  . Smoking status: Never Smoker  . Smokeless tobacco: Never Used  . Alcohol use No   Current Meds  Medication Sig  . ibuprofen (ADVIL,MOTRIN) 200 MG tablet Take 600 mg by mouth every 6 (six) hours as needed for mild pain or moderate pain.  Marland Kitchen lansoprazole (PREVACID) 30 MG capsule Take 30 mg by mouth daily at 12 noon.  . naproxen (NAPROSYN) 500 MG tablet Take 1 po BID with food prn pain  . [DISCONTINUED] ASPIRIN PO Take 1 tablet by mouth as needed (for pain).  . [DISCONTINUED] fluconazole (DIFLUCAN) 150 MG tablet Take 1 now and 1 in 3 days  . [DISCONTINUED] fluocinonide-emollient (LIDEX-E) 0.05 % cream APPLY TO AFFECTED AREA TWICE DAILY.  . [DISCONTINUED] HYDROcodone-acetaminophen (NORCO/VICODIN) 5-325 MG per tablet Take 1 tablet by mouth every 4 (four) hours as needed.  . [DISCONTINUED] lidocaine (XYLOCAINE) 2 % jelly Apply 1 application topically as  needed. Use as directed  . [DISCONTINUED] metroNIDAZOLE (FLAGYL) 500 MG tablet Take 1 tablet (500 mg total) by mouth 2 (two) times daily.  . [DISCONTINUED] nitrofurantoin, macrocrystal-monohydrate, (MACROBID) 100 MG capsule Take 1 capsule (100 mg total) by mouth 2 (two) times daily.  . [DISCONTINUED] phenazopyridine (PYRIDIUM) 100 MG tablet Take 1 tablet (100 mg total) by mouth 3 (three) times daily. Please take with a meal    BP 137/84   Pulse 85   Ht 5\' 1"  (1.549 m)   Wt 212 lb (96.2 kg)   LMP 06/29/2016   BMI 40.06 kg/m   Physical Exam  Constitutional: She is oriented to person, place, and time. She appears well-developed and well-nourished. No distress.  Cardiovascular: Normal rate and intact distal pulses.   Neurological: She is alert and oriented to person, place, and time. She has normal reflexes. She exhibits normal muscle tone. Coordination normal.  Skin: Skin is warm and dry. No rash noted. She is not diaphoretic. No erythema. No pallor.  Psychiatric: She has a normal mood and affect. Her behavior is normal. Judgment and thought content normal.    Ortho Exam Ambulatory nonweightbearing with crutches  Left ankle drawer sign positive tenderness and swelling anterior talofibular ligament painful range of motion difficult to get to neutral position weakness E Werner skin intact no ecchymosis to good distal pulse normal sensation lymph nodes negative  ASSESSMENT: My personal interpretation of the images:  Plain  film from the hospital is negative for fracture    PLAN Price protocol  Cam Walker Exercises Contrast baths Weightbearing as tolerated Cam Walker Two-week follow-up  Fuller CanadaStanley Harrison, MD 07/07/2016 2:57 PM  .meds

## 2016-07-07 NOTE — Addendum Note (Signed)
Addended by: Vickki HearingHARRISON, Lilliemae Fruge E on: 07/07/2016 03:21 PM   Modules accepted: Orders

## 2016-07-07 NOTE — Patient Instructions (Signed)
Contrast baths 5 minutes and cold for 5 minutes in warm water 30 minutes Ankle exercises 100 times a day Weightbearing as tolerated in boot Ibuprofen Hydrocodone  Return 2 weeks  Work note

## 2016-07-21 ENCOUNTER — Encounter: Payer: Self-pay | Admitting: Orthopedic Surgery

## 2016-07-21 ENCOUNTER — Ambulatory Visit (INDEPENDENT_AMBULATORY_CARE_PROVIDER_SITE_OTHER): Payer: BLUE CROSS/BLUE SHIELD | Admitting: Orthopedic Surgery

## 2016-07-21 VITALS — BP 128/86 | HR 75 | Ht 61.0 in | Wt 214.0 lb

## 2016-07-21 DIAGNOSIS — S93402D Sprain of unspecified ligament of left ankle, subsequent encounter: Secondary | ICD-10-CM | POA: Diagnosis not present

## 2016-07-21 DIAGNOSIS — S93401A Sprain of unspecified ligament of right ankle, initial encounter: Secondary | ICD-10-CM | POA: Diagnosis not present

## 2016-07-21 NOTE — Progress Notes (Signed)
Patient ID: Renato BattlesVictoria L Brawn, female   DOB: Oct 06, 1991, 25 y.o.   MRN: [id number]  Chief Complaint  Patient presents with  . Follow-up    left ankle sprain    HPI Renato BattlesVictoria L Orebaugh is a 25 y.o. female.   HPI 25 year old female with history of recurrent ankle sprains reinjured the left ankle we treated her with a Cam Walker, ankle exercises and weight-bear as tolerated with the cam walking boot and ibuprofen and hydrocodone she's here for two-week follow-up  She notes mild improvement but still having aching pain especially at rest and increase weightbearing pain when she's standing and then so over the anterolateral talofibular ligament   Review of Systems Review of Systems Normal neuro  Denies fever  Examination BP 128/86   Pulse 75   Ht 5\' 1"  (1.549 m)   Wt 214 lb (97.1 kg)   LMP 06/29/2016   BMI 40.43 kg/m   Gen. appearance the patient's appearance is normal with normal grooming and  hygiene The patient is oriented to person place and time Mood and affect are normal   Ortho Exam Gait is remarkable for limping with the boot Inspection reveals she can perform double leg straight leg raise with pain ROM is  limited in plantar flexion improved and dorsiflexion  Stability teare unchanged  Motor exno atrophy but weakness  (Skin is normal (no rash or erythema)    Medical decision-making Diagnosis, Data, Plan (risk)  ankle sprain left  Physical therapy return in 2 weeks  Work note 2 weeks    Fuller CanadaStanley Ellajane Stong, MD 07/21/2016 11:30 AM

## 2016-07-21 NOTE — Patient Instructions (Signed)
Continue Cam Walker 1 week then start ASO brace  Start physical therapy  Out of work 2 weeks  Follow-up 2 weeks

## 2016-07-21 NOTE — Addendum Note (Signed)
Addended by: Adella HareBOOTHE, Delshawn Stech B on: 07/21/2016 11:40 AM   Modules accepted: Orders

## 2016-07-26 ENCOUNTER — Ambulatory Visit (HOSPITAL_COMMUNITY): Payer: BLUE CROSS/BLUE SHIELD | Attending: Orthopedic Surgery | Admitting: Physical Therapy

## 2016-07-26 DIAGNOSIS — R262 Difficulty in walking, not elsewhere classified: Secondary | ICD-10-CM | POA: Diagnosis not present

## 2016-07-26 DIAGNOSIS — M25572 Pain in left ankle and joints of left foot: Secondary | ICD-10-CM | POA: Insufficient documentation

## 2016-07-26 DIAGNOSIS — R600 Localized edema: Secondary | ICD-10-CM | POA: Diagnosis not present

## 2016-07-26 DIAGNOSIS — M6281 Muscle weakness (generalized): Secondary | ICD-10-CM

## 2016-07-26 NOTE — Patient Instructions (Signed)
   ANKLE PUMPS - AP  Bend your foot up and down at your ankle joint as shown.  Repeat 15 times, 2-3 times per day.    ANKLE CIRCLES  Move your ankle in a circular pattern one direction for several repetitions and then reverse the direction.   Repeat 10 times, each direction, 2-3 times a day.    ANKLE ABC's   While in a seated position, write out the alphabet in the air with your big toe.  Your ankle should be moving as you perform this.  Repeat once, twice a day.    SEATED CALF STRETCH - GASTROC  While sitting, use a towel or other strap looped around your foot. Gently pull your ankle back until a stretch is felt along the back of your lower leg.  Hold for 30 seconds and then relax; repeat twice at least 2x/day.

## 2016-07-26 NOTE — Addendum Note (Signed)
Addended by: Diamantina MonksBOOTHE, Glenette Bookwalter B on: 07/26/2016 04:36 PM   Modules accepted: Orders

## 2016-07-26 NOTE — Therapy (Signed)
[place] [facility] [address] [place], [place], [address] Phone: [phone]   Fax:  [phone]  Physical Therapy Evaluation  Patient Details  Name: Cathy Wells MRN: [id number] Date of Birth: June 19, 1991 Referring Provider: Fuller Canada   Encounter Date: 07/26/2016      PT End of Session - 07/26/16 1801    Visit Number 1   Number of Visits 12   Date for PT Re-Evaluation 08/16/16   Authorization Type BCBS    Authorization Time Period 07/26/16 to 09/06/16   PT Start Time 1601   PT Stop Time 1639   PT Time Calculation (min) 38 min   Activity Tolerance Patient tolerated treatment well   Behavior During Therapy [facility] for tasks assessed/performed      Past Medical History:  Diagnosis Date  . Allergy   . Asthma   . Pelvic pain in female 05/27/2015  . Reflux   . Urinary frequency 05/27/2015  . UTI (lower urinary tract infection) 04/28/2015    Past Surgical History:  Procedure Laterality Date  . APPENDECTOMY      There were no vitals filed for this visit.       Subjective Assessment - 07/26/16 1604    Subjective Patient states that her ankle hurts very badly on the left side (it is her L ankle) and underneath; she originally hurt her ankle by stepping in a hole, and ended up twisting her ankle twice. She had an x-ray done with no fractures. She is in a boot, which was given to her on the 8th of September (injury occured in early september); she can come out of the boot next week. She is WBAT for now and is also out of work. She has been icing and elevating her ankle.    Pertinent History no signficant PMH/PSH    How long can you sit comfortably? immediate aching/discomfort without boot    How long can you stand comfortably? with boot she is OK, without immediate discomfort/difficulty    How long can you walk comfortably? with boot she is OK, without immediate discomfort/difficulty    Patient Stated Goals get back to PLOF,  strengthen ankle    Currently in Pain? Yes   Pain Score 3    Pain Location Ankle   Pain Orientation Left   Pain Descriptors / Indicators Aching   Pain Type Chronic pain   Pain Radiating Towards up L LE posteriorly    Pain Onset 1 to 4 weeks ago   Pain Frequency Intermittent   Aggravating Factors  weightbearing outside of boot   Pain Relieving Factors being in boot, contrast bath, ice/elevation    Effect of Pain on Daily Activities difficulty with activities outside of boot             [facility] PT Assessment - 07/26/16 0001      Assessment   Medical Diagnosis L ankle sprain    Referring Provider Fuller Canada    Onset Date/Surgical Date 07/06/16   Next MD Visit Dr. Romeo Apple next Tuesday      Precautions   Precautions Other (comment)   Precaution Comments needs to be in boot for now, will transition to ankle brace soon      Balance Screen   Has the patient fallen in the past 6 months Yes   How many times? 1   Has the patient had a decrease in activity level because of a fear of falling?  Yes   Is the patient  reluctant to leave their home because of a fear of falling?  No     Prior Function   Level of Independence Independent;Independent with basic ADLs   Vocation Full time employment   Vocation Requirements lots of walking, lots of heavy pushing and lifting; shifts are 8 hours     Observation/Other Assessments   Observations moderate edema noted L ankle; significant laxity noted L ankle in general. Widespread muscle guarding and apprehension to movement noted.      AROM   Right Ankle Dorsiflexion 14   Right Ankle Plantar Flexion --  wfl   Right Ankle Inversion 42   Right Ankle Eversion 23   Left Ankle Dorsiflexion 0   Left Ankle Plantar Flexion --  wfl    Left Ankle Inversion 35   Left Ankle Eversion 23     Strength   Right Knee Flexion 4/5   Right Knee Extension 4+/5   Left Knee Flexion 4/5   Left Knee Extension 4/5   Right Ankle Dorsiflexion 4+/5   Right  Ankle Inversion 5/5   Right Ankle Eversion 5/5   Left Ankle Dorsiflexion 4-/5   Left Ankle Inversion 4-/5  pain limited    Left Ankle Eversion 4+/5     Ambulation/Gait   Gait Comments circumduction of L LE due to boot/use of core/back muscles, reduced gait speed                            PT Education - 07/26/16 1800    Education provided Yes   Education Details prognosis, HEP, POC, importance of not being dependent on boot/deviations boot can cause    Person(s) Educated Patient;Parent(s)   Methods Explanation;Demonstration   Comprehension Verbalized understanding;Returned demonstration;Need further instruction          PT Short Term Goals - 07/26/16 1805      PT SHORT TERM GOAL #1   Title Patient to be able to stand/ambulate with no boot, pain in L ankle no more than 2/10, in order to improve general level of function and mobility    Time 3   Period Weeks   Status New     PT SHORT TERM GOAL #2   Title Patient to verbally state the importance of being out of her boot and the importance of good, supportive foot wear in order to support her injured ankle and promote progress in general    Time 3   Period Weeks   Status New     PT SHORT TERM GOAL #3   Title Patient to demonstrate ROM as being equal in bilateral ankles in order to improve mobility and reduce pain L ankle    Time 3   Period Weeks   Status New     PT SHORT TERM GOAL #4   Title Patient to be independent in correctly and consistently performing approprpiate HEP, to be updated PRN    Time 3   Period Weeks   Status New           PT Long Term Goals - 07/26/16 1807      PT LONG TERM GOAL #1   Title Patient to demonstrate strength 5/5 in all tested muscle groups in order to reduce pain and facilitate return to PLOF    Time 6   Period Weeks   Status New     PT LONG TERM GOAL #2   Title Patient to be able to stand/walk on L  ankle with no brace for an unlimited duration with pain no  more than 1/10 in order to facilitate return to PLOF    Time 6   Period Weeks   Status New     PT LONG TERM GOAL #3   Title Patient to be able to maintain SLS on foam for at least 30 seconds bilaterally to demonstrate improved ankle stability and reduced risk of re-injury    Time 6   Period Weeks   Status New     PT LONG TERM GOAL #4   Title Patient to return to regular exercise program, at least 20 minutes in duration, at least 4 days per week, in order to maintain functional gains and improve general health status    Time 6   Period Weeks   Status New               Plan - 07/26/16 1801    Clinical Impression Statement Patient arrives today approximately 3 weeks after ankle injury which resulted in a sprain; she states that x-ray was negative for fracture and her MD has told her to stay in the boot for weight bearing for now (She should come out next week), and to do 100 ankle pumps a day. Upon examination, patient reveals moderate edema, tenderness and muscle guarding with tendency to hold her ankle in an inverted position at seated rest, functional weakness somewhat limited by pain, significant gait impairment partially related to boot, and impaired ankle ROM. At this time patient will benefit from skilled PT services to address functional impairments and to assist in reaching optimal level of function.    Rehab Potential Good   Clinical Impairments Affecting Rehab Potential fear of activity/re-injury    PT Frequency 2x / week   PT Duration 6 weeks   PT Treatment/Interventions ADLs/Self Care Home Management;Biofeedback;Cryotherapy;Moist Heat;Gait training;Stair training;Functional mobility training;Therapeutic activities;Therapeutic exercise;Balance training;Neuromuscular re-education;Patient/family education;Orthotic Fit/Training;Manual techniques;Passive range of motion;Energy conservation;Taping   PT Next Visit Plan review HEP and initial eval/goals; focus on ankle  ROM/flexiblity first, edema control; as this improves, focus on strength and balance and gait    PT Home Exercise Plan 07/26/16: ankle pumps, ankle circles, ankle alphabet, gastroc stretch    Consulted and Agree with Plan of Care Patient      Patient will benefit from skilled therapeutic intervention in order to improve the following deficits and impairments:  Abnormal gait, Improper body mechanics, Pain, Decreased coordination, Decreased mobility, Hypermobility, Postural dysfunction, Decreased activity tolerance, Decreased strength, Decreased balance, Difficulty walking, Increased edema, Impaired flexibility  Visit Diagnosis: Pain in left ankle and joints of left foot - Plan: PT plan of care cert/re-cert  Localized edema - Plan: PT plan of care cert/re-cert  Muscle weakness (generalized) - Plan: PT plan of care cert/re-cert  Difficulty in walking, not elsewhere classified - Plan: PT plan of care cert/re-cert     Problem List Patient Active Problem List   Diagnosis Date Noted  . Urinary frequency 05/27/2015  . Pelvic pain in female 05/27/2015  . UTI (lower urinary tract infection) 04/28/2015    Nedra HaiKristen Unger PT, DPT [phone]  [facility] [address] [place], [place], [address] Phone: [phone]   Fax:  [phone]  Name: Cathy Wells MRN: [id number] Date of Birth: 1991/05/26

## 2016-07-29 ENCOUNTER — Ambulatory Visit (HOSPITAL_COMMUNITY): Payer: BLUE CROSS/BLUE SHIELD | Admitting: Physical Therapy

## 2016-07-29 ENCOUNTER — Telehealth (HOSPITAL_COMMUNITY): Payer: Self-pay | Admitting: Physical Therapy

## 2016-07-29 NOTE — Telephone Encounter (Signed)
She called to cx apptment not available today

## 2016-08-02 ENCOUNTER — Ambulatory Visit (HOSPITAL_COMMUNITY): Payer: BLUE CROSS/BLUE SHIELD | Attending: Orthopedic Surgery | Admitting: Physical Therapy

## 2016-08-02 DIAGNOSIS — R262 Difficulty in walking, not elsewhere classified: Secondary | ICD-10-CM | POA: Insufficient documentation

## 2016-08-02 DIAGNOSIS — R600 Localized edema: Secondary | ICD-10-CM | POA: Diagnosis not present

## 2016-08-02 DIAGNOSIS — M6281 Muscle weakness (generalized): Secondary | ICD-10-CM | POA: Diagnosis not present

## 2016-08-02 DIAGNOSIS — M25572 Pain in left ankle and joints of left foot: Secondary | ICD-10-CM | POA: Diagnosis not present

## 2016-08-02 NOTE — Therapy (Signed)
[place] [facility] [address] [place], [place], [address] Phone: [phone]   Fax:  [phone]  Physical Therapy Treatment  Patient Details  Name: Cathy Wells MRN: [id number] Date of Birth: [birth year] Referring Provider: Fuller Canada   Encounter Date: 08/02/2016      PT End of Session - 08/02/16 1712    Visit Number 2   Number of Visits 12   Date for PT Re-Evaluation 08/16/16   Authorization Type BCBS    Authorization Time Period 07/26/16 to 09/06/16   PT Start Time 1620   PT Stop Time 1700   PT Time Calculation (min) 40 min   Activity Tolerance Patient tolerated treatment well   Behavior During Therapy [facility] for tasks assessed/performed      Past Medical History:  Diagnosis Date  . Allergy   . Asthma   . Pelvic pain in female 05/27/2015  . Reflux   . Urinary frequency 05/27/2015  . UTI (lower urinary tract infection) 04/28/2015    Past Surgical History:  Procedure Laterality Date  . APPENDECTOMY      There were no vitals filed for this visit.      Subjective Assessment - 08/02/16 1523    Subjective Pt states MD d/c'd the boot and started wearing the ASO full time since Thursday.  Friday she states she stepped out the shower wrong and rolled her ankle again.  STates it swole immediately and she iced it.  Reports the next day the swelling was still there but did not hurt as bad as when she initially injured it.  Pt states weight bearing irritates it.     Currently in Pain? No/denies   Aggravating Factors  weightbearing increases pain to 8/10 when she steps on lateral aspect of foot.  Otherwise no pain.                         [facility] Adult PT Treatment/Exercise - 08/02/16 0001      Manual Therapy   Manual Therapy Edema management   Manual therapy comments completed seperate from all other skilled interventions, supine with LE's evevated./   Edema Management retro with focus on lateral ankle to decrease  swelling.      Ankle Exercises: Stretches   Slant Board Stretch 3 reps;30 seconds     Ankle Exercises: Standing   Vector Stance Left;5 reps;5 seconds   Rocker Board 2 minutes   Rocker Board Limitations R/L and A/P BUE assist   Heel Raises 10 reps   Toe Raise 10 reps                PT Education - 08/02/16 1717    Education provided Yes   Education Details Reviewed HEP, given copy of initial evaluation with explanation of goals and progression.   Person(s) Educated Patient   Methods Explanation;Demonstration;Tactile cues;Verbal cues;Handout   Comprehension Verbalized understanding;Returned demonstration;Verbal cues required;Tactile cues required;Need further instruction          PT Short Term Goals - 07/26/16 1805      PT SHORT TERM GOAL #1   Title Patient to be able to stand/ambulate with no boot, pain in L ankle no more than 2/10, in order to improve general level of function and mobility    Time 3   Period Weeks   Status New     PT SHORT TERM GOAL #2   Title Patient to verbally state the importance of being out of  her boot and the importance of good, supportive foot wear in order to support her injured ankle and promote progress in general    Time 3   Period Weeks   Status New     PT SHORT TERM GOAL #3   Title Patient to demonstrate ROM as being equal in bilateral ankles in order to improve mobility and reduce pain L ankle    Time 3   Period Weeks   Status New     PT SHORT TERM GOAL #4   Title Patient to be independent in correctly and consistently performing approprpiate HEP, to be updated PRN    Time 3   Period Weeks   Status New           PT Long Term Goals - 07/26/16 1807      PT LONG TERM GOAL #1   Title Patient to demonstrate strength 5/5 in all tested muscle groups in order to reduce pain and facilitate return to PLOF    Time 6   Period Weeks   Status New     PT LONG TERM GOAL #2   Title Patient to be able to stand/walk on L ankle with  no brace for an unlimited duration with pain no more than 1/10 in order to facilitate return to PLOF    Time 6   Period Weeks   Status New     PT LONG TERM GOAL #3   Title Patient to be able to maintain SLS on foam for at least 30 seconds bilaterally to demonstrate improved ankle stability and reduced risk of re-injury    Time 6   Period Weeks   Status New     PT LONG TERM GOAL #4   Title Patient to return to regular exercise program, at least 20 minutes in duration, at least 4 days per week, in order to maintain functional gains and improve general health status    Time 6   Period Weeks   Status New               Plan - 08/02/16 1713    Clinical Impression Statement PT comes with boot off today; wearing crocs with ASO and with noted antalgia.   Able to progress to beginning standing exercises with cues to complete within painfree ROM.  Pt wtih noted swelling around lateral malleoli with most tenderness in this area.  Concentrated gentle retro in this area at end of session to help reduce swelling.  PT with noted improvement in ambulation at end of session.     Rehab Potential Good   Clinical Impairments Affecting Rehab Potential fear of activity/re-injury    PT Frequency 2x / week   PT Duration 6 weeks   PT Treatment/Interventions ADLs/Self Care Home Management;Biofeedback;Cryotherapy;Moist Heat;Gait training;Stair training;Functional mobility training;Therapeutic activities;Therapeutic exercise;Balance training;Neuromuscular re-education;Patient/family education;Orthotic Fit/Training;Manual techniques;Passive range of motion;Energy conservation;Taping   PT Next Visit Plan Focus on return of ankle stability as [facility] is now [facility].     PT Home Exercise Plan 07/26/16: ankle pumps, ankle circles, ankle alphabet, gastroc stretch    Consulted and Agree with Plan of Care Patient      Patient will benefit from skilled therapeutic intervention in order to improve the following deficits and  impairments:  Abnormal gait, Improper body mechanics, Pain, Decreased coordination, Decreased mobility, Hypermobility, Postural dysfunction, Decreased activity tolerance, Decreased strength, Decreased balance, Difficulty walking, Increased edema, Impaired flexibility  Visit Diagnosis: Pain in left ankle and joints of left foot  Localized  edema  Muscle weakness (generalized)  Difficulty in walking, not elsewhere classified     Problem List Patient Active Problem List   Diagnosis Date Noted  . Urinary frequency 05/27/2015  . Pelvic pain in female 05/27/2015  . UTI (lower urinary tract infection) 04/28/2015    Lurena Nida, PTA/CLT [phone]  08/02/2016, 5:17 PM  [facility] [address] [place], [place], [address] Phone: [phone]   Fax:  [phone]  Name: Cathy Wells MRN: [id number] Date of Birth: 07-10-1991

## 2016-08-03 ENCOUNTER — Ambulatory Visit (INDEPENDENT_AMBULATORY_CARE_PROVIDER_SITE_OTHER): Payer: BLUE CROSS/BLUE SHIELD | Admitting: Orthopedic Surgery

## 2016-08-03 ENCOUNTER — Encounter: Payer: Self-pay | Admitting: Orthopedic Surgery

## 2016-08-03 VITALS — BP 140/97 | HR 89 | Ht 61.5 in | Wt 212.6 lb

## 2016-08-03 DIAGNOSIS — M25572 Pain in left ankle and joints of left foot: Secondary | ICD-10-CM | POA: Diagnosis not present

## 2016-08-03 DIAGNOSIS — M25371 Other instability, right ankle: Secondary | ICD-10-CM

## 2016-08-03 NOTE — Patient Instructions (Addendum)
You have received an injection of steroids into the joint. 15% of patients will have increased pain within the 24 hours postinjection.   This is transient and will go away.   We recommend that you use ice packs on the injection site for 20 minutes every 2 hours and extra strength Tylenol 2 tablets every 8 as needed until the pain resolves.  If you continue to have pain after taking the Tylenol and using the ice please call the office for further instructions.   No work x 2 weeks

## 2016-08-03 NOTE — Progress Notes (Signed)
Patient ID: Cathy Wells, female   DOB: 10/01/1991, 25 y.o.   MRN: [id number]  Chief Complaint  Patient presents with  . Follow-up    Left ankle sprain    HPI Cathy BattlesVictoria L Wells is a 25 y.o. female.   HPI 25 year old female with history of chronic ankle sprains had an acute on chronic ankle sprain 4 weeks ago on September 5  She has been in physical therapy has transitioned to an ASO brace  Complains of 2 more episodes of instability of the ankle  Review of Systems Review of Systems Normal neuro  Denies fever  Examination BP (!) 140/97   Pulse 89   Ht 5' 1.5" (1.562 m)   Wt 212 lb 9.6 oz (96.4 kg)   LMP 06/29/2016   BMI 39.52 kg/m   Gen. appearance the patient's appearance is normal with normal grooming and  hygiene The patient is oriented to person place and time Mood and affect are normal   Ortho Exam Tenderness in the anterior talofibular ligament and subtalar area. 1+ positive drawer test. Painful inversion of the foot. Weak peroneal strength. Skin normal. Sensation normal.  Medical decision-making Diagnosis, Data, Plan (risk)  I injected the area for meniscoid lesion  Continue ASO bracing and physical therapy Injection ankle subtalar region. 40 mg of Depo-Medrol Lidocaine 3 mL 1% alcohol and ethyl chloride for prep Verbal consent and confirmatory timeout completed I used a 25-gauge needle to inject the area. No complications were noted.  I will see her in 2 weeks  no work for the next 2 weeks    Fuller CanadaStanley Jaquise Faux, MD 08/03/2016 4:19 PM

## 2016-08-05 ENCOUNTER — Ambulatory Visit (HOSPITAL_COMMUNITY): Payer: BLUE CROSS/BLUE SHIELD

## 2016-08-05 DIAGNOSIS — R262 Difficulty in walking, not elsewhere classified: Secondary | ICD-10-CM

## 2016-08-05 DIAGNOSIS — M6281 Muscle weakness (generalized): Secondary | ICD-10-CM

## 2016-08-05 DIAGNOSIS — R600 Localized edema: Secondary | ICD-10-CM

## 2016-08-05 DIAGNOSIS — M25572 Pain in left ankle and joints of left foot: Secondary | ICD-10-CM | POA: Diagnosis not present

## 2016-08-05 NOTE — Therapy (Signed)
[place] [facility] [address] [place], [place], [address] Phone: [phone]   Fax:  [phone]  Physical Therapy Treatment  Patient Details  Name: Cathy Wells MRN: [id number] Date of Birth: 27-Jan-1991 Referring Provider: Fuller Canada   Encounter Date: 08/05/2016      PT End of Session - 08/05/16 1646    Visit Number 3   Number of Visits 12   Date for PT Re-Evaluation 08/16/16   Authorization Type BCBS    Authorization Time Period 07/26/16 to 09/06/16   PT Start Time 1642   PT Stop Time 1724   PT Time Calculation (min) 42 min   Activity Tolerance Patient tolerated treatment well   Behavior During Therapy [facility] for tasks assessed/performed      Past Medical History:  Diagnosis Date  . Allergy   . Asthma   . Pelvic pain in female 05/27/2015  . Reflux   . Urinary frequency 05/27/2015  . UTI (lower urinary tract infection) 04/28/2015    Past Surgical History:  Procedure Laterality Date  . APPENDECTOMY      There were no vitals filed for this visit.      Subjective Assessment - 08/05/16 1640    Subjective Pt stated she got a hydrocortisone shot on Tuesday and ankle is very sore today, current pain scale 6/10.     Pertinent History no signficant PMH/PSH    Patient Stated Goals get back to PLOF, strengthen ankle    Currently in Pain? Yes   Pain Score 6    Pain Location Ankle   Pain Orientation Left;Lateral   Pain Descriptors / Indicators Sore   Pain Type Chronic pain   Pain Radiating Towards up Lt LE posteriorly with weight bearing without brace on   Pain Onset 1 to 4 weeks ago   Pain Frequency Intermittent   Aggravating Factors  weightbearing increases pain to 8/10 when she steps on lateral aspect of foot.  Otherwise no pain.   Pain Relieving Factors being in boot, contrast bath, ice/elevation   Effect of Pain on Daily Activities difficulty with activities outside of boot             [facility] Adult PT  Treatment/Exercise - 08/05/16 0001      Ambulation/Gait   Ambulation/Gait Yes   Ambulation/Gait Assistance 7: Independent   Ambulation Distance (Feet) --  2x 226   Assistive device None   Gait Pattern Decreased stance time - left;Left circumduction;Step-through pattern;Decreased step length - right   Ambulation Surface Level;Indoor   Gait Comments Decreased stance phase Lt LE; circumduction of Lt LE, decreased gait speed to improve awareness of weight bearing      Manual Therapy   Manual Therapy Edema management   Manual therapy comments completed seperate from all other skilled interventions, supine with LE's evevated./   Edema Management retro with focus on lateral ankle to decrease swelling.      Ankle Exercises: Standing   Vector Stance Left;5 reps;5 seconds   Rocker Board 2 minutes   Rocker Board Limitations R/L and A/P BUE assist   Heel Raises --  reports increased pain lateral aspect of ankle following 7 r   Toe Raise 10 reps   Other Standing Ankle Exercises 2D hip excursion walk 1Rt each     Ankle Exercises: Stretches   Slant Board Stretch 3 reps;30 seconds     Ankle Exercises: Seated   Towel Crunch 1 rep  reduced flexion noted 4th and 5th toes  on Lt foot                  PT Short Term Goals - 07/26/16 1805      PT SHORT TERM GOAL #1   Title Patient to be able to stand/ambulate with no boot, pain in L ankle no more than 2/10, in order to improve general level of function and mobility    Time 3   Period Weeks   Status New     PT SHORT TERM GOAL #2   Title Patient to verbally state the importance of being out of her boot and the importance of good, supportive foot wear in order to support her injured ankle and promote progress in general    Time 3   Period Weeks   Status New     PT SHORT TERM GOAL #3   Title Patient to demonstrate ROM as being equal in bilateral ankles in order to improve mobility and reduce pain L ankle    Time 3   Period Weeks    Status New     PT SHORT TERM GOAL #4   Title Patient to be independent in correctly and consistently performing approprpiate HEP, to be updated PRN    Time 3   Period Weeks   Status New           PT Long Term Goals - 07/26/16 1807      PT LONG TERM GOAL #1   Title Patient to demonstrate strength 5/5 in all tested muscle groups in order to reduce pain and facilitate return to PLOF    Time 6   Period Weeks   Status New     PT LONG TERM GOAL #2   Title Patient to be able to stand/walk on L ankle with no brace for an unlimited duration with pain no more than 1/10 in order to facilitate return to PLOF    Time 6   Period Weeks   Status New     PT LONG TERM GOAL #3   Title Patient to be able to maintain SLS on foam for at least 30 seconds bilaterally to demonstrate improved ankle stability and reduced risk of re-injury    Time 6   Period Weeks   Status New     PT LONG TERM GOAL #4   Title Patient to return to regular exercise program, at least 20 minutes in duration, at least 4 days per week, in order to maintain functional gains and improve general health status    Time 6   Period Weeks   Status New               Plan - 08/05/16 1724    Clinical Impression Statement Pt arrived wearing crocs with ASO on Lt ankle with noted antalgia.  Pt educated on importance of equal stance phase with gait mechanics.  Added gait mechanics exercises to POC with noted decreased antalgia at end of session.  Pt able to complete all therex with good form following initial demonstration with no reports of increased pain except for heel raises, decreased reps due to pain.  Noted decreased flexion mobility on 4th and 5th toes, added towel crunch to HEP to improve toe mobility.  Noted increased edema present on lateral aspect of ankle, ended session with manual retor massage to assist with decreased swelling.  Pt with improved gait mechanics and reports of pain reduced at EOS.     Rehab Potential  Good   Clinical  Impairments Affecting Rehab Potential fear of activity/re-injury    PT Frequency 2x / week   PT Duration 6 weeks   PT Treatment/Interventions ADLs/Self Care Home Management;Biofeedback;Cryotherapy;Moist Heat;Gait training;Stair training;Functional mobility training;Therapeutic activities;Therapeutic exercise;Balance training;Neuromuscular re-education;Patient/family education;Orthotic Fit/Training;Manual techniques;Passive range of motion;Energy conservation;Taping   PT Next Visit Plan Focus on return of ankle stability as ROM is now [facility].  Add BAPS next session.   PT Home Exercise Plan 07/26/16: ankle pumps, ankle circles, ankle alphabet, gastroc stretchl; 10/05 added towel crunch      Patient will benefit from skilled therapeutic intervention in order to improve the following deficits and impairments:  Abnormal gait, Improper body mechanics, Pain, Decreased coordination, Decreased mobility, Hypermobility, Postural dysfunction, Decreased activity tolerance, Decreased strength, Decreased balance, Difficulty walking, Increased edema, Impaired flexibility  Visit Diagnosis: Pain in left ankle and joints of left foot  Localized edema  Muscle weakness (generalized)  Difficulty in walking, not elsewhere classified     Problem List Patient Active Problem List   Diagnosis Date Noted  . Urinary frequency 05/27/2015  . Pelvic pain in female 05/27/2015  . UTI (lower urinary tract infection) 04/28/2015   Becky Sax, LPTA; CBIS [phone]  Juel Burrow 08/05/2016, 5:33 PM  [place] [facility] [address] [place], [place], [address] Phone: [phone]   Fax:  [phone]  Name: Cathy Wells MRN: [id number] Date of Birth: Mar 23, 1991

## 2016-08-09 ENCOUNTER — Ambulatory Visit (HOSPITAL_COMMUNITY): Payer: BLUE CROSS/BLUE SHIELD | Admitting: Physical Therapy

## 2016-08-09 DIAGNOSIS — R262 Difficulty in walking, not elsewhere classified: Secondary | ICD-10-CM | POA: Diagnosis not present

## 2016-08-09 DIAGNOSIS — M6281 Muscle weakness (generalized): Secondary | ICD-10-CM

## 2016-08-09 DIAGNOSIS — R600 Localized edema: Secondary | ICD-10-CM | POA: Diagnosis not present

## 2016-08-09 DIAGNOSIS — M25572 Pain in left ankle and joints of left foot: Secondary | ICD-10-CM

## 2016-08-09 NOTE — Therapy (Signed)
[place] [facility] [address] [place], [place], [address] Phone: [phone]   Fax:  [phone]  Physical Therapy Treatment  Patient Details  Name: Cathy Wells MRN: [id number] Date of Birth: July 15, 1991 Referring Provider: Fuller Canada   Encounter Date: 08/09/2016      PT End of Session - 08/09/16 1743    Visit Number 4   Number of Visits 12   Date for PT Re-Evaluation 08/16/16   Authorization Type BCBS    Authorization Time Period 07/26/16 to 09/06/16   PT Start Time 1603   PT Stop Time 1643   PT Time Calculation (min) 40 min   Activity Tolerance Patient tolerated treatment well   Behavior During Therapy [facility] for tasks assessed/performed      Past Medical History:  Diagnosis Date  . Allergy   . Asthma   . Pelvic pain in female 05/27/2015  . Reflux   . Urinary frequency 05/27/2015  . UTI (lower urinary tract infection) 04/28/2015    Past Surgical History:  Procedure Laterality Date  . APPENDECTOMY      There were no vitals filed for this visit.      Subjective Assessment - 08/09/16 1606    Subjective patient reports she is feeling well today, no pain and states the ASO has been feeling good. She has a little bit of pain after PT but she does home and puts ice on and its good to go.  She feels like the shot her MD gave her helped.    Pertinent History no signficant PMH/PSH    Patient Stated Goals get back to PLOF, strengthen ankle    Currently in Pain? No/denies                         [facility] Adult PT Treatment/Exercise - 08/09/16 0001      Ankle Exercises: Seated   Towel Crunch 3 reps   Heel Raises 15 reps   Toe Raise 15 reps   Other Seated Ankle Exercises 4 way ankle exercises with red TB for DF/inversion/eversion   Other Seated Ankle Exercises rockerboard 2 minutes each direction, BAPs 2 minutes each direction      Ankle Exercises: Stretches   Slant Board Stretch 3 reps;30 seconds     Ankle  Exercises: Standing   Heel Raises 10 reps   Toe Raise 10 reps             Balance Exercises - 08/09/16 1638      Balance Exercises: Standing   Tandem Stance Foam/compliant surface;3 reps;15 secs   SLS Eyes open;1 rep;Other (comment);Modified  R LE on ball, pain limited            PT Education - 08/09/16 1743    Education provided No          PT Short Term Goals - 07/26/16 1805      PT SHORT TERM GOAL #1   Title Patient to be able to stand/ambulate with no boot, pain in L ankle no more than 2/10, in order to improve general level of function and mobility    Time 3   Period Weeks   Status New     PT SHORT TERM GOAL #2   Title Patient to verbally state the importance of being out of her boot and the importance of good, supportive foot wear in order to support her injured ankle and promote progress in general    Time  3   Period Weeks   Status New     PT SHORT TERM GOAL #3   Title Patient to demonstrate ROM as being equal in bilateral ankles in order to improve mobility and reduce pain L ankle    Time 3   Period Weeks   Status New     PT SHORT TERM GOAL #4   Title Patient to be independent in correctly and consistently performing approprpiate HEP, to be updated PRN    Time 3   Period Weeks   Status New           PT Long Term Goals - 07/26/16 1807      PT LONG TERM GOAL #1   Title Patient to demonstrate strength 5/5 in all tested muscle groups in order to reduce pain and facilitate return to PLOF    Time 6   Period Weeks   Status New     PT LONG TERM GOAL #2   Title Patient to be able to stand/walk on L ankle with no brace for an unlimited duration with pain no more than 1/10 in order to facilitate return to PLOF    Time 6   Period Weeks   Status New     PT LONG TERM GOAL #3   Title Patient to be able to maintain SLS on foam for at least 30 seconds bilaterally to demonstrate improved ankle stability and reduced risk of re-injury    Time 6    Period Weeks   Status New     PT LONG TERM GOAL #4   Title Patient to return to regular exercise program, at least 20 minutes in duration, at least 4 days per week, in order to maintain functional gains and improve general health status    Time 6   Period Weeks   Status New               Plan - 08/09/16 1743    Clinical Impression Statement Performed increased amount of strengthening and motor control based activities in sitting today, however also incorporated ongoing CKC strengthening and balance activities today as well. Patient seems to be progressing well and is consistently using her ASO, however she does demonstrate muscle fatigue with motor control and balance activities; only able to perform one rep of modified SLS today due to increased pain L ankle. Recommend HEP update next session.    Rehab Potential Good   Clinical Impairments Affecting Rehab Potential fear of activity/re-injury    PT Frequency 2x / week   PT Duration 6 weeks   PT Treatment/Interventions ADLs/Self Care Home Management;Biofeedback;Cryotherapy;Moist Heat;Gait training;Stair training;Functional mobility training;Therapeutic activities;Therapeutic exercise;Balance training;Neuromuscular re-education;Patient/family education;Orthotic Fit/Training;Manual techniques;Passive range of motion;Energy conservation;Taping   PT Next Visit Plan Focus on return of ankle stability as ROM is now [facility]  Continue BAPS. Update HEP.    PT Home Exercise Plan 07/26/16: ankle pumps, ankle circles, ankle alphabet, gastroc stretchl; 10/05 added towel crunch   Consulted and Agree with Plan of Care Patient      Patient will benefit from skilled therapeutic intervention in order to improve the following deficits and impairments:  Abnormal gait, Improper body mechanics, Pain, Decreased coordination, Decreased mobility, Hypermobility, Postural dysfunction, Decreased activity tolerance, Decreased strength, Decreased balance, Difficulty  walking, Increased edema, Impaired flexibility  Visit Diagnosis: Pain in left ankle and joints of left foot  Localized edema  Muscle weakness (generalized)  Difficulty in walking, not elsewhere classified     Problem List Patient Active  Problem List   Diagnosis Date Noted  . Urinary frequency 05/27/2015  . Pelvic pain in female 05/27/2015  . UTI (lower urinary tract infection) 04/28/2015    Nedra HaiKristen Chasten Blaze PT, DPT [phone]  [facility] [address] [place], [place], [address] Phone: [phone]   Fax:  [phone]  Name: Cathy Wells MRN: [id number] Date of Birth: Apr 05, 1991

## 2016-08-12 ENCOUNTER — Ambulatory Visit (HOSPITAL_COMMUNITY): Payer: BLUE CROSS/BLUE SHIELD | Admitting: Physical Therapy

## 2016-08-12 DIAGNOSIS — R600 Localized edema: Secondary | ICD-10-CM

## 2016-08-12 DIAGNOSIS — M25572 Pain in left ankle and joints of left foot: Secondary | ICD-10-CM

## 2016-08-12 DIAGNOSIS — M6281 Muscle weakness (generalized): Secondary | ICD-10-CM | POA: Diagnosis not present

## 2016-08-12 DIAGNOSIS — R262 Difficulty in walking, not elsewhere classified: Secondary | ICD-10-CM

## 2016-08-12 NOTE — Therapy (Signed)
[place] [facility] [address] [place], [place], [address] Phone: [phone]   Fax:  [phone]  Physical Therapy Treatment  Patient Details  Name: TERREE GAULTNEY MRN: [id number] Date of Birth: 10-Feb-1991 Referring Provider: Fuller [place]   Encounter Date: 08/12/2016      PT End of Session - 08/12/16 1657    Visit Number 5   Number of Visits 12   Date for PT Re-Evaluation 08/16/16   Authorization Type BCBS    Authorization Time Period 07/26/16 to 09/06/16   PT Start Time 1606   PT Stop Time 1645   PT Time Calculation (min) 39 min   Activity Tolerance Patient tolerated treatment well   Behavior During Therapy [facility] for tasks assessed/performed      Past Medical History:  Diagnosis Date  . Allergy   . Asthma   . Pelvic pain in female 05/27/2015  . Reflux   . Urinary frequency 05/27/2015  . UTI (lower urinary tract infection) 04/28/2015    Past Surgical History:  Procedure Laterality Date  . APPENDECTOMY      There were no vitals filed for this visit.      Subjective Assessment - 08/12/16 1608    Subjective Patient arrives stating she is feeling well, she felt pretty good after last session, she did have some increased pain the other day but this has since resolved    Pertinent History no signficant PMH/PSH    Currently in Pain? No/denies                         [facility] Adult PT Treatment/Exercise - 08/12/16 0001      Ankle Exercises: Seated   Towel Crunch 3 reps   Heel Raises 15 reps   Toe Raise 15 reps   Other Seated Ankle Exercises 4 way ankle exercises with red TB for DF/inversion/eversion   Other Seated Ankle Exercises rockerboard 3 minutes each direction, BAPs 3 minutes each direction      Ankle Exercises: Standing   Heel Raises 15 reps   Toe Raise 15 reps     Ankle Exercises: Stretches   Slant Board Stretch 3 reps;30 seconds             Balance Exercises - 08/12/16 1636      Balance  Exercises: Standing   Tandem Stance Eyes open;Foam/compliant surface;3 reps;20 secs   SLS Eyes open;4 reps;10 secs  R foot on ball for SLS assist            PT Education - 08/12/16 1657    Education provided No          PT Short Term Goals - 07/26/16 1805      PT SHORT TERM GOAL #1   Title Patient to be able to stand/ambulate with no boot, pain in L ankle no more than 2/10, in order to improve general level of function and mobility    Time 3   Period Weeks   Status New     PT SHORT TERM GOAL #2   Title Patient to verbally state the importance of being out of her boot and the importance of good, supportive foot wear in order to support her injured ankle and promote progress in general    Time 3   Period Weeks   Status New     PT SHORT TERM GOAL #3   Title Patient to demonstrate ROM as being equal in bilateral ankles in  order to improve mobility and reduce pain L ankle    Time 3   Period Weeks   Status New     PT SHORT TERM GOAL #4   Title Patient to be independent in correctly and consistently performing approprpiate HEP, to be updated PRN    Time 3   Period Weeks   Status New           PT Long Term Goals - 07/26/16 1807      PT LONG TERM GOAL #1   Title Patient to demonstrate strength 5/5 in all tested muscle groups in order to reduce pain and facilitate return to PLOF    Time 6   Period Weeks   Status New     PT LONG TERM GOAL #2   Title Patient to be able to stand/walk on L ankle with no brace for an unlimited duration with pain no more than 1/10 in order to facilitate return to PLOF    Time 6   Period Weeks   Status New     PT LONG TERM GOAL #3   Title Patient to be able to maintain SLS on foam for at least 30 seconds bilaterally to demonstrate improved ankle stability and reduced risk of re-injury    Time 6   Period Weeks   Status New     PT LONG TERM GOAL #4   Title Patient to return to regular exercise program, at least 20 minutes in  duration, at least 4 days per week, in order to maintain functional gains and improve general health status    Time 6   Period Weeks   Status New               Plan - 08/12/16 1701    Clinical Impression Statement Patient arrives today stating she is feeling quite good, she was a little sore after last session but she has been feeling better in general; continued with a similar course of care as last session, progressing repetitions and time for exercises as appropriate in order to continue to challenge ankle strength and stability. Continued with CKC exercise as well; patient will be due for re-assessment soon and pending outcome/findings of re-assessment patient may be ready for increased CKC loading.    Rehab Potential Good   Clinical Impairments Affecting Rehab Potential fear of activity/re-injury    PT Frequency 2x / week   PT Duration 6 weeks   PT Treatment/Interventions ADLs/Self Care Home Management;Biofeedback;Cryotherapy;Moist Heat;Gait training;Stair training;Functional mobility training;Therapeutic activities;Therapeutic exercise;Balance training;Neuromuscular re-education;Patient/family education;Orthotic Fit/Training;Manual techniques;Passive range of motion;Energy conservation;Taping   PT Next Visit Plan re-assess/HEP update    Consulted and Agree with Plan of Care Patient      Patient will benefit from skilled therapeutic intervention in order to improve the following deficits and impairments:  Abnormal gait, Improper body mechanics, Pain, Decreased coordination, Decreased mobility, Hypermobility, Postural dysfunction, Decreased activity tolerance, Decreased strength, Decreased balance, Difficulty walking, Increased edema, Impaired flexibility  Visit Diagnosis: Pain in left ankle and joints of left foot  Localized edema  Muscle weakness (generalized)  Difficulty in walking, not elsewhere classified     Problem List Patient Active Problem List   Diagnosis Date  Noted  . Urinary frequency 05/27/2015  . Pelvic pain in female 05/27/2015  . UTI (lower urinary tract infection) 04/28/2015    Nedra HaiKristen Unger PT, DPT [phone]  [facility] [address] [place] [place], [place], [address] Phone: [phone]   Fax:  [phone]  Name: CHRISHAWNA FARINA MRN: [id number] Date of Birth: 1991/04/27

## 2016-08-16 ENCOUNTER — Ambulatory Visit (HOSPITAL_COMMUNITY): Payer: BLUE CROSS/BLUE SHIELD | Admitting: Physical Therapy

## 2016-08-16 DIAGNOSIS — R600 Localized edema: Secondary | ICD-10-CM | POA: Diagnosis not present

## 2016-08-16 DIAGNOSIS — M6281 Muscle weakness (generalized): Secondary | ICD-10-CM | POA: Diagnosis not present

## 2016-08-16 DIAGNOSIS — M25572 Pain in left ankle and joints of left foot: Secondary | ICD-10-CM

## 2016-08-16 DIAGNOSIS — R262 Difficulty in walking, not elsewhere classified: Secondary | ICD-10-CM

## 2016-08-16 NOTE — Therapy (Signed)
[facility] [address] [place], [place], [address] Phone: [phone]   Fax:  [phone]  Physical Therapy Treatment (Re-Assessment)  Patient Details  Name: Cathy Wells MRN: [id number] Date of Birth: 08-24-1991 Referring Provider: Arther Abbott   Encounter Date: 08/16/2016      PT End of Session - 08/16/16 1732    Visit Number 6   Number of Visits 12   Date for PT Re-Evaluation 09/06/16   Authorization Type BC[employer]    Authorization Time Period 07/26/16 to 09/06/16   PT Start Time 1646   PT Stop Time 1727   PT Time Calculation (min) 41 min   Activity Tolerance Patient tolerated treatment well   Behavior During Therapy [facility] for tasks assessed/performed      Past Medical History:  Diagnosis Date  . Allergy   . Asthma   . Pelvic pain in female 05/27/2015  . Reflux   . Urinary frequency 05/27/2015  . UTI (lower urinary tract infection) 04/28/2015    Past Surgical History:  Procedure Laterality Date  . APPENDECTOMY      There were no vitals filed for this visit.      Subjective Assessment - 08/16/16 1649    Subjective Patient arrives stating that her ankle has been a sore today and last night but in general she feels like she has gotten better; it has gotten much easier for her to do basically all of her functional tasks in weight bearing right now. Her pain can get to 6/10 at worst, mostly when she has been up on her feet a lot. It is still hard for her to perform extended tasks without taking breaks due to ankle pain. She feels like her balance is better than before. She rates herself as being 70/100 with her main concern being difficulty being up on her feet for extended times.    Pertinent History no signficant PMH/PSH    How long can you sit comfortably? 10/16- unlimited    How long can you stand comfortably? 10/16- OK with the brace but she does have immediate discomfort without brace    How long can you walk  comfortably? 10/16- 20 minutes with brace    Patient Stated Goals get back to PLOF, strengthen ankle    Currently in Pain? No/denies            [facility] PT Assessment - 08/16/16 0001      AROM   Right Ankle Dorsiflexion 14   Right Ankle Plantar Flexion --  WFL    Right Ankle Inversion 48   Right Ankle Eversion 20   Left Ankle Dorsiflexion 11   Left Ankle Plantar Flexion --  WFL    Left Ankle Inversion 35   Left Ankle Eversion 25     Strength   Right Knee Flexion 4/5   Right Knee Extension 4+/5   Left Knee Flexion 4/5   Left Knee Extension 4+/5   Right Ankle Dorsiflexion 5/5   Right Ankle Inversion 5/5   Right Ankle Eversion 5/5   Left Ankle Dorsiflexion 4/5   Left Ankle Inversion 4/5   Left Ankle Eversion 4/5     6 minute walk test results    Aerobic Endurance Distance Walked 565   Endurance additional comments 3MWT      High Level Balance   High Level Balance Comments 30 seconds SLS R LE (stopped by PT); 12 seconds SLS L LE with brace  [facility] Adult PT Treatment/Exercise - 08/16/16 0001      Ankle Exercises: Standing   Heel Raises 15 reps   Toe Raise 15 reps     Ankle Exercises: Stretches   Slant Board Stretch 3 reps;30 seconds                PT Education - 08/16/16 1732    Education provided Yes   Education Details progress with skilled PT services, HEP update, POC    Person(s) Educated Patient   Methods Explanation;Demonstration;Handout   Comprehension Verbalized understanding;Returned demonstration;Need further instruction          PT Short Term Goals - 08/16/16 1713      PT SHORT TERM GOAL #1   Title Patient to be able to stand/ambulate with no boot, pain in L ankle no more than 2/10, in order to improve general level of function and mobility    Baseline 10/16- out of boot, pain can still spike up to 6/10 sometimes    Time 3   Period Weeks   Status Partially Met     PT SHORT TERM GOAL #2   Title  Patient to verbally state the importance of being out of her boot and the importance of good, supportive foot wear in order to support her injured ankle and promote progress in general    Baseline 10/16- finally able to transition to tennis shoes and brace    Time 3   Period Weeks   Status On-going     PT SHORT TERM GOAL #3   Title Patient to demonstrate ROM as being equal in bilateral ankles in order to improve mobility and reduce pain L ankle    Baseline 10/16- great progress    Time 3   Period Weeks   Status Partially Met     PT SHORT TERM GOAL #4   Title Patient to be independent in correctly and consistently performing approprpiate HEP, to be updated PRN    Baseline 10/16- reports compliance   Time 3   Period Weeks   Status Achieved           PT Long Term Goals - 08/16/16 1715      PT LONG TERM GOAL #1   Title Patient to demonstrate strength 5/5 in all tested muscle groups in order to reduce pain and facilitate return to PLOF    Baseline 10/16- L ankle remains weak    Time 6   Period Weeks   Status On-going     PT LONG TERM GOAL #2   Title Patient to be able to stand/walk on L ankle with no brace for an unlimited duration with pain no more than 1/10 in order to facilitate return to PLOF    Baseline 10/16- ongoing    Time 6   Period Weeks   Status On-going     PT LONG TERM GOAL #3   Title Patient to be able to maintain SLS on foam for at least 30 seconds bilaterally to demonstrate improved ankle stability and reduced risk of re-injury    Baseline 10/16- 30 seconds R, 12 seconds L with brace and solid surface    Time 6   Period Weeks   Status On-going     PT LONG TERM GOAL #4   Title Patient to return to regular exercise program, at least 20 minutes in duration, at least 4 days per week, in order to maintain functional gains and improve general health status    Time 6  Period Weeks   Status On-going               Plan - 08/16/16 1733    Clinical  Impression Statement Re-assessment performed today. Patient appears to be making good progress with skilled PT services, with her L ankle nearly catching up to that of her uninjured R, and with improved gait pattern and functional task performance/tolerance. However she continues to demonstrate significant weakness in her L ankle as well as ongoing instability, L ankle pain, and reduced functional balance. At this time DPT does remain concerned about patient's readiness to return to full duty at work at this time secondary to ongoing ankle weakness and increased pain with extended activities. Recommend extension of skilled PT services to focus primarily on L ankle strength, muscle endurance, and balance to facilitate safe and successful return to PLOF and work requirements.    Rehab Potential Good   Clinical Impairments Affecting Rehab Potential fear of activity/re-injury    PT Frequency 2x / week   PT Duration 3 weeks   PT Treatment/Interventions ADLs/Self Care Home Management;Biofeedback;Cryotherapy;Moist Heat;Gait training;Stair training;Functional mobility training;Therapeutic activities;Therapeutic exercise;Balance training;Neuromuscular re-education;Patient/family education;Orthotic Fit/Training;Manual techniques;Passive range of motion;Energy conservation;Taping   PT Next Visit Plan focus on L ankle strength, endurance, and general balance.    PT Home Exercise Plan 07/26/16: ankle pumps, ankle circles, ankle alphabet, gastroc stretchl; 10/05 added towel crunch; 10/16 added standing heel and toe raises and red TB inversion/eversion    Consulted and Agree with Plan of Care Patient      Patient will benefit from skilled therapeutic intervention in order to improve the following deficits and impairments:  Abnormal gait, Improper body mechanics, Pain, Decreased coordination, Decreased mobility, Hypermobility, Postural dysfunction, Decreased activity tolerance, Decreased strength, Decreased balance,  Difficulty walking, Increased edema, Impaired flexibility  Visit Diagnosis: Pain in left ankle and joints of left foot  Localized edema  Muscle weakness (generalized)  Difficulty in walking, not elsewhere classified     Problem List Patient Active Problem List   Diagnosis Date Noted  . Urinary frequency 05/27/2015  . Pelvic pain in female 05/27/2015  . UTI (lower urinary tract infection) 04/28/2015    Deniece Ree PT, DPT [facility] [address] [place], [place], [address] Phone: [phone]   Fax:  [phone]  Name: Cathy Wells MRN: [id number] Date of Birth: 02-28-1991

## 2016-08-16 NOTE — Patient Instructions (Signed)
   STANDING HEEL RAISES  While standing, raise up on your toes as you lift your heels off the ground.  Repeat 10-15 times, twice a day out of the brace.     TOES RAISES - DORSIFLEXION STANDING  In a standing position with your feet on the ground, raise up your forefoot and toes as you bend at your ankle.   Repeat 10-15 times, twice a day out of the brace.      Ankle Inversion with Theraband  Pt seated, loops theraband around foot. Pull the top band away from body and bottom band towards you. Pt brings foot in toward midline moving from the ankle, making sure to avoid compensatory motions in the leg or hip.  Repeat 10-15 times with red therband, out of brace, twice a day.    Ankle Eversion with Theraband  Patient is seated with knees bent or straight. Loop the theraband around foot with top band going away from affected side. Pull foot away from the middle of your body- be careful not to rotate your foot, and do not compensate with motion at the leg or hip.  Repeat 10-15 times, out of the brace, twice a day with the red therband.

## 2016-08-17 ENCOUNTER — Encounter: Payer: Self-pay | Admitting: Orthopedic Surgery

## 2016-08-17 ENCOUNTER — Ambulatory Visit (INDEPENDENT_AMBULATORY_CARE_PROVIDER_SITE_OTHER): Payer: BLUE CROSS/BLUE SHIELD | Admitting: Orthopedic Surgery

## 2016-08-17 DIAGNOSIS — M25371 Other instability, right ankle: Secondary | ICD-10-CM

## 2016-08-17 NOTE — Patient Instructions (Addendum)
CONTINUE PT   OOW 3 MORE WEEKS

## 2016-08-17 NOTE — Progress Notes (Signed)
This is a follow-up visit status post ankle sprain on 07/06/2016  Pressure currently in a ASO brace and physical therapy. Now she can perform a single leg heel rise with her sneaker an ASO brace on although she cannot perform it with them off  She did get an injection subfibular for pain which improved her symptoms for 2 or 3 days and then they return with pain in the anterolateral gutter and subfibular area  Review of systems are negative for numbness tingling skin changes fever chills  Exam shows a positive drawer test tenderness in the subfibular area she's regained full range of motion with weakness of her inverters she has a 1+ drawer test skin is intact pulses are good sensation is normal single leg heel raise with her brace on and shoe on are near normal but with pain  Encounter Diagnosis  Name Primary?  Marland Kitchen. Ankle instability, right Yes    Continue therapy follow-up 3 weeks continue work out of work 3 weeks

## 2016-08-18 ENCOUNTER — Ambulatory Visit (HOSPITAL_COMMUNITY): Payer: BLUE CROSS/BLUE SHIELD | Admitting: Physical Therapy

## 2016-08-18 ENCOUNTER — Telehealth (HOSPITAL_COMMUNITY): Payer: Self-pay

## 2016-08-18 NOTE — Telephone Encounter (Signed)
08/18/16 pt left a message to cx - said she woke up sick and can't come in today

## 2016-08-23 ENCOUNTER — Ambulatory Visit (HOSPITAL_COMMUNITY): Payer: BLUE CROSS/BLUE SHIELD | Admitting: Physical Therapy

## 2016-08-23 DIAGNOSIS — M6281 Muscle weakness (generalized): Secondary | ICD-10-CM | POA: Diagnosis not present

## 2016-08-23 DIAGNOSIS — R262 Difficulty in walking, not elsewhere classified: Secondary | ICD-10-CM

## 2016-08-23 DIAGNOSIS — R600 Localized edema: Secondary | ICD-10-CM

## 2016-08-23 DIAGNOSIS — M25572 Pain in left ankle and joints of left foot: Secondary | ICD-10-CM | POA: Diagnosis not present

## 2016-08-23 NOTE — Therapy (Signed)
[facility] [address] [place], [place], [address] Phone: [phone]   Fax:  [phone]  Physical Therapy Treatment  Patient Details  Name: Cathy Wells MRN: [id number] Date of Birth: 06-26-1991 Referring Provider: Arther Abbott   Encounter Date: 08/23/2016      PT End of Session - 08/23/16 1647    Visit Number 7   Number of Visits 12   Date for PT Re-Evaluation 09/06/16   Authorization Type [employer]    Authorization Time Period 07/26/16 to 09/06/16   PT Start Time 1603   PT Stop Time 1642   PT Time Calculation (min) 39 min   Activity Tolerance Patient tolerated treatment well   Behavior During Therapy [facility] for tasks assessed/performed      Past Medical History:  Diagnosis Date  . Allergy   . Asthma   . Pelvic pain in female 05/27/2015  . Reflux   . Urinary frequency 05/27/2015  . UTI (lower urinary tract infection) 04/28/2015    Past Surgical History:  Procedure Laterality Date  . APPENDECTOMY      There were no vitals filed for this visit.      Subjective Assessment - 08/23/16 1605    Subjective Patient arrives today stating that she went to [place] recently and is out of work for 3 more weeks; her MD is also keeping her in the brace when she is standing up as well. Nothing else major going on right now.    Pertinent History no signficant PMH/PSH    Patient Stated Goals get back to PLOF, strengthen ankle    Currently in Pain? No/denies                         [facility] Adult PT Treatment/Exercise - 08/23/16 0001      Knee/Hip Exercises: Standing   Forward Lunges Both;1 set;10 reps   Forward Lunges Limitations 4 inch box    Lateral Step Up Both;1 set;10 reps   Lateral Step Up Limitations 4 inch box    Forward Step Up Both;1 set;10 reps   Forward Step Up Limitations 4 inch box      Ankle Exercises: Stretches   Slant Board Stretch 3 reps;30 seconds     Ankle Exercises: Seated   Heel Raises 15 reps    Toe Raise 15 reps     Ankle Exercises: Standing   BAPS Standing;Level 2;10 reps;Other (comment)  CW and CCW    Rocker Board 2 minutes  no brace    Rocker Board Limitations AP and lateral fingertip support    Heel Raises 15 reps  no brace    Toe Raise 15 reps  no brace    Heel Walk (Round Trip) 1RT no brace    Toe Walk (Round Trip) 1RT brace    Other Standing Ankle Exercises both up for heel raise, L down 1x10  no brace              Balance Exercises - 08/23/16 1628      Balance Exercises: Standing   Tandem Stance Eyes open;Foam/compliant surface;3 reps;20 secs   SLS Eyes open;4 reps;10 secs   Other Standing Exercises weight shifts onto blue air pad L LE 1x10           PT Education - 08/23/16 1646    Education provided Yes   Education Details ice/elevate after session due to more aggressive session    Person(s) Educated Patient  Methods Explanation   Comprehension Verbalized understanding          PT Short Term Goals - 08/16/16 1713      PT SHORT TERM GOAL #1   Title Patient to be able to stand/ambulate with no boot, pain in L ankle no more than 2/10, in order to improve general level of function and mobility    Baseline 10/16- out of boot, pain can still spike up to 6/10 sometimes    Time 3   Period Weeks   Status Partially Met     PT SHORT TERM GOAL #2   Title Patient to verbally state the importance of being out of her boot and the importance of good, supportive foot wear in order to support her injured ankle and promote progress in general    Baseline 10/16- finally able to transition to tennis shoes and brace    Time 3   Period Weeks   Status On-going     PT SHORT TERM GOAL #3   Title Patient to demonstrate ROM as being equal in bilateral ankles in order to improve mobility and reduce pain L ankle    Baseline 10/16- great progress    Time 3   Period Weeks   Status Partially Met     PT SHORT TERM GOAL #4   Title Patient to be independent  in correctly and consistently performing approprpiate HEP, to be updated PRN    Baseline 10/16- reports compliance   Time 3   Period Weeks   Status Achieved           PT Long Term Goals - 08/16/16 1715      PT LONG TERM GOAL #1   Title Patient to demonstrate strength 5/5 in all tested muscle groups in order to reduce pain and facilitate return to PLOF    Baseline 10/16- L ankle remains weak    Time 6   Period Weeks   Status On-going     PT LONG TERM GOAL #2   Title Patient to be able to stand/walk on L ankle with no brace for an unlimited duration with pain no more than 1/10 in order to facilitate return to PLOF    Baseline 10/16- ongoing    Time 6   Period Weeks   Status On-going     PT LONG TERM GOAL #3   Title Patient to be able to maintain SLS on foam for at least 30 seconds bilaterally to demonstrate improved ankle stability and reduced risk of re-injury    Baseline 10/16- 30 seconds R, 12 seconds L with brace and solid surface    Time 6   Period Weeks   Status On-going     PT LONG TERM GOAL #4   Title Patient to return to regular exercise program, at least 20 minutes in duration, at least 4 days per week, in order to maintain functional gains and improve general health status    Time 6   Period Weeks   Status On-going               Plan - 08/23/16 1647    Clinical Impression Statement Progressed focus of session to be completely CKC exercise; performed exercise/activities out of brace to patient tolerance, however did use brace for some of session as a precaution due to very high levels of muscular fatigue with some exercises- totals for session were approximately 50/50 in/out of brace this session. Advised patient to ice/elevate as a precaution due to  introduction of more aggressive CKC activities and exercises today. Recommend use of brace PRN during session however continue working out of brace as much as patient is able to tolerate. No increase in pain this  session.    Rehab Potential Good   Clinical Impairments Affecting Rehab Potential fear of activity/re-injury    PT Frequency 2x / week   PT Duration 3 weeks   PT Treatment/Interventions ADLs/Self Care Home Management;Biofeedback;Cryotherapy;Moist Heat;Gait training;Stair training;Functional mobility training;Therapeutic activities;Therapeutic exercise;Balance training;Neuromuscular re-education;Patient/family education;Orthotic Fit/Training;Manual techniques;Passive range of motion;Energy conservation;Taping   PT Next Visit Plan CKC exercise out of brace as much as patient can tolerate, however may use brace PRN during session with significant fatigue/pain   PT Home Exercise Plan 07/26/16: ankle pumps, ankle circles, ankle alphabet, gastroc stretchl; 10/05 added towel crunch; 10/16 added standing heel and toe raises and red TB inversion/eversion    Consulted and Agree with Plan of Care Patient      Patient will benefit from skilled therapeutic intervention in order to improve the following deficits and impairments:  Abnormal gait, Improper body mechanics, Pain, Decreased coordination, Decreased mobility, Hypermobility, Postural dysfunction, Decreased activity tolerance, Decreased strength, Decreased balance, Difficulty walking, Increased edema, Impaired flexibility  Visit Diagnosis: Pain in left ankle and joints of left foot  Localized edema  Muscle weakness (generalized)  Difficulty in walking, not elsewhere classified     Problem List Patient Active Problem List   Diagnosis Date Noted  . Urinary frequency 05/27/2015  . Pelvic pain in female 05/27/2015  . UTI (lower urinary tract infection) 04/28/2015    Deniece Ree PT, DPT [facility] [address] [place], [place], [address] Phone: [phone]   Fax:  [phone]  Name: Cathy Wells MRN: [id number] Date of Birth: Mar 11, 1991

## 2016-08-25 ENCOUNTER — Ambulatory Visit (HOSPITAL_COMMUNITY): Payer: BLUE CROSS/BLUE SHIELD

## 2016-08-25 DIAGNOSIS — M6281 Muscle weakness (generalized): Secondary | ICD-10-CM

## 2016-08-25 DIAGNOSIS — M25572 Pain in left ankle and joints of left foot: Secondary | ICD-10-CM | POA: Diagnosis not present

## 2016-08-25 DIAGNOSIS — R262 Difficulty in walking, not elsewhere classified: Secondary | ICD-10-CM | POA: Diagnosis not present

## 2016-08-25 DIAGNOSIS — R600 Localized edema: Secondary | ICD-10-CM

## 2016-08-25 NOTE — Therapy (Signed)
[place], [place], [address] Phone: [phone]   Fax:  [phone]  Physical Therapy Treatment  Patient Details  Name: Cathy Wells MRN: [id number] Date of Birth: 19-Feb-1991 Referring Provider: Arther Abbott   Encounter Date: 08/25/2016      PT End of Session - 08/25/16 1608    Visit Number 8   Number of Visits 12   Date for PT Re-Evaluation 09/06/16   Authorization Type BCBS    Authorization Time Period 07/26/16 to 09/06/16   PT Start Time 1605   PT Stop Time 1643   PT Time Calculation (min) 38 min   Activity Tolerance Patient tolerated treatment well   Behavior During Therapy [facility] for tasks assessed/performed      Past Medical History:  Diagnosis Date  . Allergy   . Asthma   . Pelvic pain in female 05/27/2015  . Reflux   . Urinary frequency 05/27/2015  . UTI (lower urinary tract infection) 04/28/2015    Past Surgical History:  Procedure Laterality Date  . APPENDECTOMY      There were no vitals filed for this visit.      Subjective Assessment - 08/25/16 1607    Subjective Pt stated her ankle feels good unless she makes certain movements (end range inversion and plantarflexion).  Reports compliance with HEP daily.   Pertinent History no signficant PMH/PSH    Patient Stated Goals get back to PLOF, strengthen ankle    Currently in Pain? No/denies           [facility] Adult PT Treatment/Exercise - 08/25/16 0001      Ambulation/Gait   Ambulation/Gait --   Ambulation/Gait Assistance --     Knee/Hip Exercises: Standing   Lateral Step Up Left;10 reps;Hand Hold: 0;Step Height: 6"   Step Down Left;10 reps;Hand Hold: 1;Step Height: 4"     Ankle Exercises: Stretches   Slant Board Stretch 3 reps;30 seconds     Ankle Exercises: Standing   BAPS Standing;Level 3;10 reps;Other (comment)  A/P, R/L, CW and CCW   SLS Rt 39", Lt 31" max of 3   Rocker Board 2 minutes   Rocker Board Limitations AP  and lateral with intermittent fingertip support    Heel Raises 15 reps  no brace   Toe Raise 15 reps  no brace   Heel Walk (Round Trip) 2RT (no brace)   Toe Walk (Round Trip) 2RT no brace                  PT Short Term Goals - 08/16/16 1713      PT SHORT TERM GOAL #1   Title Patient to be able to stand/ambulate with no boot, pain in L ankle no more than 2/10, in order to improve general level of function and mobility    Baseline 10/16- out of boot, pain can still spike up to 6/10 sometimes    Time 3   Period Weeks   Status Partially Met     PT SHORT TERM GOAL #2   Title Patient to verbally state the importance of being out of her boot and the importance of good, supportive foot wear in order to support her injured ankle and promote progress in general    Baseline 10/16- finally able to transition to tennis shoes and brace    Time 3   Period Weeks   Status On-going     PT SHORT TERM GOAL #3   Title Patient  to demonstrate ROM as being equal in bilateral ankles in order to improve mobility and reduce pain L ankle    Baseline 10/16- great progress    Time 3   Period Weeks   Status Partially Met     PT SHORT TERM GOAL #4   Title Patient to be independent in correctly and consistently performing approprpiate HEP, to be updated PRN    Baseline 10/16- reports compliance   Time 3   Period Weeks   Status Achieved           PT Long Term Goals - 08/16/16 1715      PT LONG TERM GOAL #1   Title Patient to demonstrate strength 5/5 in all tested muscle groups in order to reduce pain and facilitate return to PLOF    Baseline 10/16- L ankle remains weak    Time 6   Period Weeks   Status On-going     PT LONG TERM GOAL #2   Title Patient to be able to stand/walk on L ankle with no brace for an unlimited duration with pain no more than 1/10 in order to facilitate return to PLOF    Baseline 10/16- ongoing    Time 6   Period Weeks   Status On-going     PT LONG TERM  GOAL #3   Title Patient to be able to maintain SLS on foam for at least 30 seconds bilaterally to demonstrate improved ankle stability and reduced risk of re-injury    Baseline 10/16- 30 seconds R, 12 seconds L with brace and solid surface    Time 6   Period Weeks   Status On-going     PT LONG TERM GOAL #4   Title Patient to return to regular exercise program, at least 20 minutes in duration, at least 4 days per week, in order to maintain functional gains and improve general health status    Time 6   Period Weeks   Status On-going               Plan - 08/25/16 1730    Clinical Impression Statement Continued session focus on improving ankle strengthening and stability.  Pt with improved tolerance with ability to tolerate session for longer period of time without brace (30 minutes) prior application due to fatigue.  Added SLS to improve ankle stability.  No reports of increased pain at EOS, does state some discomfort with full plantar flexion and reports improved tolerance with inversion motion.  Pt advised to add ice/elevation if any increased pain/edema following session   Rehab Potential Good   Clinical Impairments Affecting Rehab Potential fear of activity/re-injury    PT Frequency 2x / week   PT Duration 3 weeks   PT Treatment/Interventions ADLs/Self Care Home Management;Biofeedback;Cryotherapy;Moist Heat;Gait training;Stair training;Functional mobility training;Therapeutic activities;Therapeutic exercise;Balance training;Neuromuscular re-education;Patient/family education;Orthotic Fit/Training;Manual techniques;Passive range of motion;Energy conservation;Taping   PT Next Visit Plan CKC exercise out of brace as much as patient can tolerate, however may use brace PRN during session with significant fatigue/pain      Patient will benefit from skilled therapeutic intervention in order to improve the following deficits and impairments:  Abnormal gait, Improper body mechanics, Pain,  Decreased coordination, Decreased mobility, Hypermobility, Postural dysfunction, Decreased activity tolerance, Decreased strength, Decreased balance, Difficulty walking, Increased edema, Impaired flexibility  Visit Diagnosis: Pain in left ankle and joints of left foot  Localized edema  Muscle weakness (generalized)  Difficulty in walking, not elsewhere classified     Problem List  Patient Active Problem List   Diagnosis Date Noted  . Urinary frequency 05/27/2015  . Pelvic pain in female 05/27/2015  . UTI (lower urinary tract infection) 04/28/2015   Ihor Austin, [place]; [place] Aldona Lento 08/25/2016, 5:38 PM  [place], [place], [address] Phone: [phone]   Fax:  [phone]  Name: Cathy Wells MRN: [id number] Date of Birth: 05-14-1991

## 2016-08-30 ENCOUNTER — Ambulatory Visit (HOSPITAL_COMMUNITY): Payer: BLUE CROSS/BLUE SHIELD | Admitting: Physical Therapy

## 2016-08-30 DIAGNOSIS — R262 Difficulty in walking, not elsewhere classified: Secondary | ICD-10-CM | POA: Diagnosis not present

## 2016-08-30 DIAGNOSIS — R600 Localized edema: Secondary | ICD-10-CM | POA: Diagnosis not present

## 2016-08-30 DIAGNOSIS — M25572 Pain in left ankle and joints of left foot: Secondary | ICD-10-CM | POA: Diagnosis not present

## 2016-08-30 DIAGNOSIS — M6281 Muscle weakness (generalized): Secondary | ICD-10-CM

## 2016-08-30 NOTE — Therapy (Signed)
[facility] [address] [place], [place], [address] Phone: [phone]   Fax:  [phone]  Physical Therapy Treatment  Patient Details  Name: Cathy Wells MRN: [id number] Date of Birth: 1991/08/09 Referring Provider: Arther Abbott   Encounter Date: 08/30/2016      PT End of Session - 08/30/16 1642    Visit Number 9   Number of Visits 12   Date for PT Re-Evaluation 09/06/16   Authorization Type BCBS    Authorization Time Period 07/26/16 to 09/06/16   PT Start Time 1608   PT Stop Time 1650   PT Time Calculation (min) 42 min   Activity Tolerance Patient tolerated treatment well   Behavior During Therapy [facility] for tasks assessed/performed      Past Medical History:  Diagnosis Date  . Allergy   . Asthma   . Pelvic pain in female 05/27/2015  . Reflux   . Urinary frequency 05/27/2015  . UTI (lower urinary tract infection) 04/28/2015    Past Surgical History:  Procedure Laterality Date  . APPENDECTOMY      There were no vitals filed for this visit.      Subjective Assessment - 08/30/16 1652    Subjective Pt 7 minutes late for appt.  Pt states she over did it a little over the weekend; [place].  Currently without pain but has been uncomfortable.    Currently in Pain? No/denies                         [facility] Adult PT Treatment/Exercise - 08/30/16 0001      Knee/Hip Exercises: Standing   Forward Lunges Both;1 set;15 reps   Forward Lunges Limitations 4 inch box    Lateral Step Up Left;Hand Hold: 0;Step Height: 6";15 reps   Lateral Step Up Limitations 6 inch box    Step Down Left;Hand Hold: 1;Step Height: 4";15 reps   Step Down Limitations 4" step     Modalities   Modalities Cryotherapy     Cryotherapy   Number Minutes Cryotherapy 10 Minutes   Cryotherapy Location Ankle  Left lateral ankle (no charge)   Type of Cryotherapy Ice pack     Ankle Exercises:  Standing   BAPS Standing;Level 3;10 reps;Other (comment)  with opposite foot on board   SLS Rt 35", Lt 25" max of 3   Rocker Board 2 minutes   Rocker Board Limitations AP and lateral with intermittent fingertip support    Heel Raises 15 reps   Toe Raise 15 reps   Heel Walk (Round Trip) 2RT (no brace)   Toe Walk (Round Trip) 2RT no brace     Ankle Exercises: Stretches   Slant Board Stretch 3 reps;30 seconds                  PT Short Term Goals - 08/16/16 1713      PT SHORT TERM GOAL #1   Title Patient to be able to stand/ambulate with no boot, pain in L ankle no more than 2/10, in order to improve general level of function and mobility    Baseline 10/16- out of boot, pain can still spike up to 6/10 sometimes    Time 3   Period Weeks   Status Partially Met     PT SHORT TERM GOAL #2   Title Patient to verbally state the importance of being out of her boot  and the importance of good, supportive foot wear in order to support her injured ankle and promote progress in general    Baseline 10/16- finally able to transition to tennis shoes and brace    Time 3   Period Weeks   Status On-going     PT SHORT TERM GOAL #3   Title Patient to demonstrate ROM as being equal in bilateral ankles in order to improve mobility and reduce pain L ankle    Baseline 10/16- great progress    Time 3   Period Weeks   Status Partially Met     PT SHORT TERM GOAL #4   Title Patient to be independent in correctly and consistently performing approprpiate HEP, to be updated PRN    Baseline 10/16- reports compliance   Time 3   Period Weeks   Status Achieved           PT Long Term Goals - 08/16/16 1715      PT LONG TERM GOAL #1   Title Patient to demonstrate strength 5/5 in all tested muscle groups in order to reduce pain and facilitate return to PLOF    Baseline 10/16- L ankle remains weak    Time 6   Period Weeks   Status On-going     PT LONG TERM GOAL #2   Title Patient to be able  to stand/walk on L ankle with no brace for an unlimited duration with pain no more than 1/10 in order to facilitate return to PLOF    Baseline 10/16- ongoing    Time 6   Period Weeks   Status On-going     PT LONG TERM GOAL #3   Title Patient to be able to maintain SLS on foam for at least 30 seconds bilaterally to demonstrate improved ankle stability and reduced risk of re-injury    Baseline 10/16- 30 seconds R, 12 seconds L with brace and solid surface    Time 6   Period Weeks   Status On-going     PT LONG TERM GOAL #4   Title Patient to return to regular exercise program, at least 20 minutes in duration, at least 4 days per week, in order to maintain functional gains and improve general health status    Time 6   Period Weeks   Status On-going               Plan - 08/30/16 1646    Clinical Impression Statement Continued to focus on improving ankle strength/stabiltiy.  Completed entire session without brace today.  did c/o discomfort without Rt foot touching down on BAPS but able to complete on level #3 with Rt foot resting on board.  Ended session with icepack to help calm down tissue and decrease edema.     Rehab Potential Good   Clinical Impairments Affecting Rehab Potential fear of activity/re-injury    PT Frequency 2x / week   PT Duration 3 weeks   PT Treatment/Interventions ADLs/Self Care Home Management;Biofeedback;Cryotherapy;Moist Heat;Gait training;Stair training;Functional mobility training;Therapeutic activities;Therapeutic exercise;Balance training;Neuromuscular re-education;Patient/family education;Orthotic Fit/Training;Manual techniques;Passive range of motion;Energy conservation;Taping   PT Next Visit Plan CKC exercise out of brace as much as patient can tolerate, however may use brace PRN during session with significant fatigue/pain      Patient will benefit from skilled therapeutic intervention in order to improve the following deficits and impairments:   Abnormal gait, Improper body mechanics, Pain, Decreased coordination, Decreased mobility, Hypermobility, Postural dysfunction, Decreased activity tolerance, Decreased strength, Decreased balance,  Difficulty walking, Increased edema, Impaired flexibility  Visit Diagnosis: Pain in left ankle and joints of left foot  Localized edema  Muscle weakness (generalized)  Difficulty in walking, not elsewhere classified     Problem List Patient Active Problem List   Diagnosis Date Noted  . Urinary frequency 05/27/2015  . Pelvic pain in female 05/27/2015  . UTI (lower urinary tract infection) 04/28/2015    Teena Irani, PTA/CLT [phone]  08/30/2016, 4:54 PM  [facility] [address] [place], [place], [address] Phone: [phone]   Fax:  [phone]  Name: Cathy Wells MRN: [id number] Date of Birth: [birth year]

## 2016-09-01 ENCOUNTER — Ambulatory Visit (HOSPITAL_COMMUNITY): Payer: BLUE CROSS/BLUE SHIELD | Attending: Orthopedic Surgery | Admitting: Physical Therapy

## 2016-09-01 DIAGNOSIS — M6281 Muscle weakness (generalized): Secondary | ICD-10-CM

## 2016-09-01 DIAGNOSIS — R600 Localized edema: Secondary | ICD-10-CM | POA: Insufficient documentation

## 2016-09-01 DIAGNOSIS — M25572 Pain in left ankle and joints of left foot: Secondary | ICD-10-CM | POA: Diagnosis not present

## 2016-09-01 DIAGNOSIS — R262 Difficulty in walking, not elsewhere classified: Secondary | ICD-10-CM | POA: Diagnosis not present

## 2016-09-01 NOTE — Therapy (Signed)
[facility] [address] [place], [place], [address] Phone: [phone]   Fax:  [phone]  Physical Therapy Treatment  Patient Details  Name: LOVETTE MERTA MRN: [id number] Date of Birth: 1991/06/05 Referring Provider: Arther Abbott   Encounter Date: 09/01/2016      PT End of Session - 09/01/16 1646    Visit Number 10   Number of Visits 12   Date for PT Re-Evaluation 09/06/16   Authorization Type BCBS    Authorization Time Period 07/26/16 to 09/06/16   PT Start Time 1602   PT Stop Time 1640   PT Time Calculation (min) 38 min   Activity Tolerance Patient tolerated treatment well   Behavior During Therapy [facility] for tasks assessed/performed      Past Medical History:  Diagnosis Date  . Allergy   . Asthma   . Pelvic pain in female 05/27/2015  . Reflux   . Urinary frequency 05/27/2015  . UTI (lower urinary tract infection) 04/28/2015    Past Surgical History:  Procedure Laterality Date  . APPENDECTOMY      There were no vitals filed for this visit.      Subjective Assessment - 09/01/16 1610    Subjective Pt reports she has been sore with aggrevation type pain of 4/10.  States she is slated to return to work on the 14th but unsure at this time if she can do this.   Currently in Pain? Yes   Pain Score 4    Pain Location Ankle   Pain Orientation Left;Lateral   Pain Descriptors / Indicators Sore   Pain Type Chronic pain                         [facility] Adult PT Treatment/Exercise - 09/01/16 0001      Knee/Hip Exercises: Standing   Forward Lunges Both;1 set;15 reps   Forward Lunges Limitations 4 inch box    Lateral Step Up Left;Hand Hold: 0;Step Height: 6";15 reps   Lateral Step Up Limitations 6 inch box    Step Down Left;Hand Hold: 1;Step Height: 4";15 reps   Step Down Limitations 4" step     Modalities   Modalities Cryotherapy     Cryotherapy   Number Minutes Cryotherapy 4 Minutes   Cryotherapy  Location Ankle   Type of Cryotherapy Ice massage     Ankle Exercises: Stretches   Slant Board Stretch 3 reps;30 seconds     Ankle Exercises: Standing   BAPS Standing;Level 3;10 reps;Other (comment)   Heel Raises 15 reps   Heel Walk (Round Trip) 2RT (no brace)   Toe Walk (Round Trip) 2RT no brace   Other Standing Ankle Exercises both up for heel raise, L down 1x10                  PT Short Term Goals - 08/16/16 1713      PT SHORT TERM GOAL #1   Title Patient to be able to stand/ambulate with no boot, pain in L ankle no more than 2/10, in order to improve general level of function and mobility    Baseline 10/16- out of boot, pain can still spike up to 6/10 sometimes    Time 3   Period Weeks   Status Partially Met     PT SHORT TERM GOAL #2   Title Patient to verbally state the importance of being out of her boot and the importance of good, supportive  foot wear in order to support her injured ankle and promote progress in general    Baseline 10/16- finally able to transition to tennis shoes and brace    Time 3   Period Weeks   Status On-going     PT SHORT TERM GOAL #3   Title Patient to demonstrate ROM as being equal in bilateral ankles in order to improve mobility and reduce pain L ankle    Baseline 10/16- great progress    Time 3   Period Weeks   Status Partially Met     PT SHORT TERM GOAL #4   Title Patient to be independent in correctly and consistently performing approprpiate HEP, to be updated PRN    Baseline 10/16- reports compliance   Time 3   Period Weeks   Status Achieved           PT Long Term Goals - 08/16/16 1715      PT LONG TERM GOAL #1   Title Patient to demonstrate strength 5/5 in all tested muscle groups in order to reduce pain and facilitate return to PLOF    Baseline 10/16- L ankle remains weak    Time 6   Period Weeks   Status On-going     PT LONG TERM GOAL #2   Title Patient to be able to stand/walk on L ankle with no brace for  an unlimited duration with pain no more than 1/10 in order to facilitate return to PLOF    Baseline 10/16- ongoing    Time 6   Period Weeks   Status On-going     PT LONG TERM GOAL #3   Title Patient to be able to maintain SLS on foam for at least 30 seconds bilaterally to demonstrate improved ankle stability and reduced risk of re-injury    Baseline 10/16- 30 seconds R, 12 seconds L with brace and solid surface    Time 6   Period Weeks   Status On-going     PT LONG TERM GOAL #4   Title Patient to return to regular exercise program, at least 20 minutes in duration, at least 4 days per week, in order to maintain functional gains and improve general health status    Time 6   Period Weeks   Status On-going               Plan - 09/01/16 1647    Clinical Impression Statement Continued with focus on improving ankle strength and stability.  Pt was able to complete all exercises without brace and without c/o pain.  BAPS completed this session wtihout Rt toe down as well.  Pt overall improving.  Completed ice massage to inferior lateral malleoli area at end of session with patient reporting being painfree at end of session.     Rehab Potential Good   Clinical Impairments Affecting Rehab Potential fear of activity/re-injury    PT Frequency 2x / week   PT Duration 3 weeks   PT Treatment/Interventions ADLs/Self Care Home Management;Biofeedback;Cryotherapy;Moist Heat;Gait training;Stair training;Functional mobility training;Therapeutic activities;Therapeutic exercise;Balance training;Neuromuscular re-education;Patient/family education;Orthotic Fit/Training;Manual techniques;Passive range of motion;Energy conservation;Taping   PT Next Visit Plan continue session without use of brace.  Assess how ice massage helped at end of session. Re-evaluate X 2 more sessions.      Patient will benefit from skilled therapeutic intervention in order to improve the following deficits and impairments:   Abnormal gait, Improper body mechanics, Pain, Decreased coordination, Decreased mobility, Hypermobility, Postural dysfunction, Decreased activity tolerance, Decreased  strength, Decreased balance, Difficulty walking, Increased edema, Impaired flexibility  Visit Diagnosis: Pain in left ankle and joints of left foot  Localized edema  Muscle weakness (generalized)  Difficulty in walking, not elsewhere classified     Problem List Patient Active Problem List   Diagnosis Date Noted  . Urinary frequency 05/27/2015  . Pelvic pain in female 05/27/2015  . UTI (lower urinary tract infection) 04/28/2015    Teena Irani, PTA/CLT [phone]  09/01/2016, 4:51 PM  [facility] [address] [place], [place], [address] Phone: [phone]   Fax:  [phone]  Name: AIDEN RAO MRN: [id number] Date of Birth: 08-13-1991

## 2016-09-06 ENCOUNTER — Ambulatory Visit (HOSPITAL_COMMUNITY): Payer: BLUE CROSS/BLUE SHIELD | Admitting: Physical Therapy

## 2016-09-06 DIAGNOSIS — M25572 Pain in left ankle and joints of left foot: Secondary | ICD-10-CM | POA: Diagnosis not present

## 2016-09-06 DIAGNOSIS — R262 Difficulty in walking, not elsewhere classified: Secondary | ICD-10-CM | POA: Diagnosis not present

## 2016-09-06 DIAGNOSIS — M6281 Muscle weakness (generalized): Secondary | ICD-10-CM

## 2016-09-06 DIAGNOSIS — R600 Localized edema: Secondary | ICD-10-CM | POA: Diagnosis not present

## 2016-09-06 NOTE — Therapy (Signed)
[facility] [address] [place], [place], [address] Phone: [phone]   Fax:  [phone]  Physical Therapy Treatment  Patient Details  Name: Cathy Wells MRN: [id number] Date of Birth: 30-Oct-1991 Referring Provider: Arther Abbott   Encounter Date: 09/06/2016      PT End of Session - 09/06/16 1730    Visit Number 11   Number of Visits 12   Date for PT Re-Evaluation 09/06/16   Authorization Type [employer]    Authorization Time Period 07/26/16 to 09/06/16   PT Start Time 1610   PT Stop Time 1650   PT Time Calculation (min) 40 min   Activity Tolerance Patient tolerated treatment well   Behavior During Therapy [facility] for tasks assessed/performed      Past Medical History:  Diagnosis Date  . Allergy   . Asthma   . Pelvic pain in female 05/27/2015  . Reflux   . Urinary frequency 05/27/2015  . UTI (lower urinary tract infection) 04/28/2015    Past Surgical History:  Procedure Laterality Date  . APPENDECTOMY      There were no vitals filed for this visit.      Subjective Assessment - 09/06/16 1733    Subjective PT states she is going longer without her brace.  Reports the ice massage really helped last session.  Overall improving.    Currently in Pain? No/denies                         [facility] Adult PT Treatment/Exercise - 09/06/16 0001      Knee/Hip Exercises: Standing   Forward Lunges Both;1 set;15 reps   Forward Lunges Limitations 4 inch box      Ankle Exercises: Stretches   Slant Board Stretch 3 reps;30 seconds     Ankle Exercises: Standing   BAPS Standing;Level 3;10 reps;Other (comment)   SLS Rt 60", Lt 48" max one time   Heel Raises 15 reps   Toe Raise 15 reps   Heel Walk (Round Trip) 2RT   Toe Walk (Round Trip) 2RT    Other Standing Ankle Exercises both up for heel raise, L down 1x10     Ankle Exercises: Seated   Towel Inversion/Eversion 4 reps  eversion only with 5# dumbell started hurting  at 4X                  PT Short Term Goals - 08/16/16 1713      PT SHORT TERM GOAL #1   Title Patient to be able to stand/ambulate with no boot, pain in L ankle no more than 2/10, in order to improve general level of function and mobility    Baseline 10/16- out of boot, pain can still spike up to 6/10 sometimes    Time 3   Period Weeks   Status Partially Met     PT SHORT TERM GOAL #2   Title Patient to verbally state the importance of being out of her boot and the importance of good, supportive foot wear in order to support her injured ankle and promote progress in general    Baseline 10/16- finally able to transition to tennis shoes and brace    Time 3   Period Weeks   Status On-going     PT SHORT TERM GOAL #3   Title Patient to demonstrate ROM as being equal in bilateral ankles in order to improve mobility and reduce pain L ankle  Baseline 10/16- great progress    Time 3   Period Weeks   Status Partially Met     PT SHORT TERM GOAL #4   Title Patient to be independent in correctly and consistently performing approprpiate HEP, to be updated PRN    Baseline 10/16- reports compliance   Time 3   Period Weeks   Status Achieved           PT Long Term Goals - 08/16/16 1715      PT LONG TERM GOAL #1   Title Patient to demonstrate strength 5/5 in all tested muscle groups in order to reduce pain and facilitate return to PLOF    Baseline 10/16- L ankle remains weak    Time 6   Period Weeks   Status On-going     PT LONG TERM GOAL #2   Title Patient to be able to stand/walk on L ankle with no brace for an unlimited duration with pain no more than 1/10 in order to facilitate return to PLOF    Baseline 10/16- ongoing    Time 6   Period Weeks   Status On-going     PT LONG TERM GOAL #3   Title Patient to be able to maintain SLS on foam for at least 30 seconds bilaterally to demonstrate improved ankle stability and reduced risk of re-injury    Baseline 10/16- 30  seconds R, 12 seconds L with brace and solid surface    Time 6   Period Weeks   Status On-going     PT LONG TERM GOAL #4   Title Patient to return to regular exercise program, at least 20 minutes in duration, at least 4 days per week, in order to maintain functional gains and improve general health status    Time 6   Period Weeks   Status On-going               Plan - 09/06/16 1730    Clinical Impression Statement Continued with ankle stability therex.  Pt able to complete entire session wtihout ASO brace and without pain.  Able to SLS 1 minute on Rt and 48" on Lt today without UE assist.  Added towel pull eversion with 5# weight and able to complete 4 reps when began to become uncomfortable.  Ice massage completed with painfree reports at end of session . Pt is going to try to go without brace until she comes back in 2 days for re-evaluation.    Rehab Potential Good   Clinical Impairments Affecting Rehab Potential fear of activity/re-injury    PT Frequency 2x / week   PT Duration 3 weeks   PT Treatment/Interventions ADLs/Self Care Home Management;Biofeedback;Cryotherapy;Moist Heat;Gait training;Stair training;Functional mobility training;Therapeutic activities;Therapeutic exercise;Balance training;Neuromuscular re-education;Patient/family education;Orthotic Fit/Training;Manual techniques;Passive range of motion;Energy conservation;Taping   PT Next Visit Plan continue session without use of brace.   Re-evaluate next session.      Patient will benefit from skilled therapeutic intervention in order to improve the following deficits and impairments:  Abnormal gait, Improper body mechanics, Pain, Decreased coordination, Decreased mobility, Hypermobility, Postural dysfunction, Decreased activity tolerance, Decreased strength, Decreased balance, Difficulty walking, Increased edema, Impaired flexibility  Visit Diagnosis: Pain in left ankle and joints of left foot  Localized  edema  Muscle weakness (generalized)  Difficulty in walking, not elsewhere classified     Problem List Patient Active Problem List   Diagnosis Date Noted  . Urinary frequency 05/27/2015  . Pelvic pain in female 05/27/2015  .  UTI (lower urinary tract infection) 04/28/2015    Teena Irani, PTA/CLT [phone]  09/06/2016, 5:34 PM  [facility] [address] [place], [place], [address] Phone: [phone]   Fax:  [phone]  Name: Cathy Wells MRN: [id number] Date of Birth: June 25, 1991

## 2016-09-08 ENCOUNTER — Ambulatory Visit (HOSPITAL_COMMUNITY): Payer: BLUE CROSS/BLUE SHIELD | Admitting: Physical Therapy

## 2016-09-08 ENCOUNTER — Telehealth (HOSPITAL_COMMUNITY): Payer: Self-pay | Admitting: Physical Therapy

## 2016-09-08 NOTE — Telephone Encounter (Signed)
L/m for pt to call and reschedule offered 11:15am this morning, waiting on return phone call from patient . NF 09/08/16 $25 payment due

## 2016-09-10 ENCOUNTER — Ambulatory Visit (HOSPITAL_COMMUNITY): Payer: BLUE CROSS/BLUE SHIELD | Admitting: Physical Therapy

## 2016-09-10 ENCOUNTER — Encounter: Payer: Self-pay | Admitting: Orthopedic Surgery

## 2016-09-10 ENCOUNTER — Ambulatory Visit (INDEPENDENT_AMBULATORY_CARE_PROVIDER_SITE_OTHER): Payer: BLUE CROSS/BLUE SHIELD | Admitting: Orthopedic Surgery

## 2016-09-10 DIAGNOSIS — M6281 Muscle weakness (generalized): Secondary | ICD-10-CM | POA: Diagnosis not present

## 2016-09-10 DIAGNOSIS — R262 Difficulty in walking, not elsewhere classified: Secondary | ICD-10-CM | POA: Diagnosis not present

## 2016-09-10 DIAGNOSIS — M25572 Pain in left ankle and joints of left foot: Secondary | ICD-10-CM

## 2016-09-10 DIAGNOSIS — M25372 Other instability, left ankle: Secondary | ICD-10-CM | POA: Diagnosis not present

## 2016-09-10 DIAGNOSIS — R600 Localized edema: Secondary | ICD-10-CM

## 2016-09-10 MED ORDER — HYDROCODONE-ACETAMINOPHEN 5-325 MG PO TABS: 1.0000 | ORAL_TABLET | Freq: Four times a day (QID) | ORAL | 0 refills | Status: DC | PRN

## 2016-09-10 NOTE — Progress Notes (Signed)
Patient ID: Cathy Wells, female   DOB: [birth year], 25 y.o.   MRN: [id number]  Chief Complaint  Patient presents with  . Follow-up    3 week recheck after PT on left ankle for instability.    HPI Cathy Wells is a 25 y.o. female.   HPI  25 year old female with history of multiple ankle sprains reinjured the ankle in September. She has ankle laxity. She's been in physical therapy making slow and steady improvement.  She was able to perform her last therapy without any brace assistants  She still has some pain  Review of Systems Review of Systems  No instability episodes Physical Exam There were no vitals taken for this visit.   Physical Exam   She walks without a limp she has no tenderness or swelling in the ankle she still has 1+ drawer she has normal range of motion is good strength in the muscles around the ankle joint skin is intact pulses are good sensation is normal  She can return to work on Monday, November 13 with her brace on  Follow-up as needed  Meds ordered this encounter  Medications  . HYDROcodone-acetaminophen (NORCO/VICODIN) 5-325 MG tablet    Sig: Take 1 tablet by mouth every 6 (six) hours as needed for moderate pain.    Dispense:  42 tablet    Refill:  0

## 2016-09-10 NOTE — Patient Instructions (Signed)
RTW MON 13 NOV  WEAR BRACE AT WORK

## 2016-09-12 NOTE — Therapy (Signed)
[facility] [address] [place], [place], [address] Phone: [phone]   Fax:  [phone]  Physical Therapy Treatment/Discharge  Patient Details  Name: Cathy Wells MRN: [id number] Date of Birth: [birth year] Referring Provider: Arther Abbott, MD  Encounter Date: 09/10/2016    Past Medical History:  Diagnosis Date  . Allergy   . Asthma   . Pelvic pain in female 05/27/2015  . Reflux   . Urinary frequency 05/27/2015  . UTI (lower urinary tract infection) 04/28/2015    Past Surgical History:  Procedure Laterality Date  . APPENDECTOMY      There were no vitals filed for this visit.      Subjective Assessment - 09/12/16 1557    Subjective Pt states she has been walking around without her brace and feels that she has improved. She feels that she has made an 80% improvement overall.     Pertinent History no signficant PMH/PSH    How long can you sit comfortably? unlimited    How long can you walk comfortably? able to complete her chores and daily activities, only fatigue reported with larger shopping trips like [employer], etc.    Patient Stated Goals get back to PLOF, strengthen ankle    Currently in Pain? No/denies            [facility] PT Assessment - 09/12/16 0001      Assessment   Medical Diagnosis L ankle sprain    Referring Provider Arther Abbott, MD   Onset Date/Surgical Date 07/06/16   Next MD Visit Released 09/10/16 to go back to work    Prior Therapy none      Precautions   Precautions Other (comment)   Precaution Comments wears ankle brace as needed      Balance Screen   Has the patient fallen in the past 6 months No   Has the patient had a decrease in activity level because of a fear of falling?  No   Is the patient reluctant to leave their home because of a fear of falling?  No     Prior Function   Level of Independence Independent;Independent with basic ADLs   Vocation Full time employment    Vocation Requirements lots of walking, lots of heavy pushing and lifting; shifts are 8 hours     Observation/Other Assessments   Observations none noted      AROM   Right Ankle Dorsiflexion 14   Right Ankle Plantar Flexion --  wfl   Right Ankle Inversion 42   Right Ankle Eversion 23   Left Ankle Dorsiflexion 12  PROM   Left Ankle Plantar Flexion --  wfl    Left Ankle Inversion 42   Left Ankle Eversion 23     Strength   Right Knee Flexion 5/5   Right Knee Extension 5/5   Left Knee Flexion 5/5   Left Knee Extension 5/5   Right Ankle Dorsiflexion 5/5   Right Ankle Inversion 5/5   Right Ankle Eversion 5/5   Left Ankle Dorsiflexion 5/5   Left Ankle Inversion 5/5   Left Ankle Eversion 5/5     High Level Balance   High Level Balance Comments SLS: 30+ sec on each LE                              PT Education - 09/12/16 1558    Education provided Yes   Education Details goals/progress  made; importance of spending time out of the ankle brace to decrease any development of fear avoidant behaviors and improve ankle strength/proprioception; continued HEP adherence    Person(s) Educated Patient   Methods Explanation   Comprehension Verbalized understanding          PT Short Term Goals - 09/10/16 1719      PT SHORT TERM GOAL #1   Title Patient to be able to stand/ambulate with no boot, pain in L ankle no more than 2/10, in order to improve general level of function and mobility    Baseline 10/16- out of boot, pain can still spike up to 6/10 sometimes    Time 3   Period Weeks   Status Achieved     PT SHORT TERM GOAL #2   Title Patient to verbally state the importance of being out of her boot and the importance of good, supportive foot wear in order to support her injured ankle and promote progress in general    Baseline 10/16- finally able to transition to tennis shoes and brace    Time 3   Period Weeks   Status Achieved     PT SHORT TERM GOAL #3    Title Patient to demonstrate ROM as being equal in bilateral ankles in order to improve mobility and reduce pain L ankle    Baseline 10/16- great progress    Time 3   Period Weeks   Status Achieved     PT SHORT TERM GOAL #4   Title Patient to be independent in correctly and consistently performing approprpiate HEP, to be updated PRN    Baseline 10/16- reports compliance   Time 3   Period Weeks   Status Achieved           PT Long Term Goals - 09/10/16 1720      PT LONG TERM GOAL #1   Title Patient to demonstrate strength 5/5 in all tested muscle groups in order to reduce pain and facilitate return to PLOF    Baseline 10/16- L ankle remains weak    Time 6   Period Weeks   Status Achieved     PT LONG TERM GOAL #2   Title Patient to be able to stand/walk on L ankle with no brace for an unlimited duration with pain no more than 1/10 in order to facilitate return to PLOF    Baseline 10/16- ongoing    Time 6   Period Weeks   Status Achieved     PT LONG TERM GOAL #3   Title Patient to be able to maintain SLS on foam for at least 30 seconds bilaterally to demonstrate improved ankle stability and reduced risk of re-injury    Baseline 10/16- 30 seconds R, 12 seconds L with brace and solid surface; 09/10/16 30 sec on each LE, no pain    Time 6   Period Weeks   Status Achieved     PT LONG TERM GOAL #4   Title Patient to return to regular exercise program, at least 20 minutes in duration, at least 4 days per week, in order to maintain functional gains and improve general health status    Baseline 4x/week HEP adherence   Time 6   Period Weeks   Status Achieved               Plan - 09/12/16 Cathy Wells has made good progress since beginning PT, meeting all of her goals. She  demonstrates improved ankle ROM and strength with 5/5 strength and WNL that is [facility] and equal to the contralateral LE. She reports that she has been virtually pain free  for the past week and reporting she has been able to return to her PLOF with only ankle fatigue during prolonged walking. She also demonstrates improved ankle proprioception evident by her SLS times and with the higher level of balance activities she has been performing in her sessions without complain of pain or unsteadiness. At this time, she is pleased with her progress, has met all goals and has been cleared to return to work by her referring provider. We will discharge her from PT with encouragement to continue with her most updated HEP to continue to maintain progress. She is in agreement with d/c at this time.    Rehab Potential Good   Clinical Impairments Affecting Rehab Potential fear of activity/re-injury    PT Frequency 2x / week   PT Duration 3 weeks   PT Treatment/Interventions ADLs/Self Care Home Management;Biofeedback;Cryotherapy;Moist Heat;Gait training;Stair training;Functional mobility training;Therapeutic activities;Therapeutic exercise;Balance training;Neuromuscular re-education;Patient/family education;Orthotic Fit/Training;Manual techniques;Passive range of motion;Energy conservation;Taping   PT Next Visit Plan d/c    PT Home Exercise Plan 07/26/16: ankle pumps, ankle circles, ankle alphabet, gastroc stretchl; 10/05 added towel crunch; 10/16 added standing heel and toe raises and red TB inversion/eversion    Consulted and Agree with Plan of Care Patient      Patient will benefit from skilled therapeutic intervention in order to improve the following deficits and impairments:  Abnormal gait, Improper body mechanics, Pain, Decreased coordination, Decreased mobility, Hypermobility, Postural dysfunction, Decreased activity tolerance, Decreased strength, Decreased balance, Difficulty walking, Increased edema, Impaired flexibility  Visit Diagnosis: Pain in left ankle and joints of left foot - Plan: PT plan of care cert/re-cert  Localized edema - Plan: PT plan of care  cert/re-cert  Muscle weakness (generalized) - Plan: PT plan of care cert/re-cert  Difficulty in walking, not elsewhere classified - Plan: PT plan of care cert/re-cert     Problem List Patient Active Problem List   Diagnosis Date Noted  . Urinary frequency 05/27/2015  . Pelvic pain in female 05/27/2015  . UTI (lower urinary tract infection) 04/28/2015    PHYSICAL THERAPY DISCHARGE SUMMARY  Visits from Start of Care: 12  Current functional level related to goals / functional outcomes: Cleared to return to work, participating in all activities she was able to participate prior to her injury/diagnosis. Full ankle strength, ROM WNL and symmetrical. Proprioception much improved from initial evaluation.   Remaining deficits: Ankle fatigue with prolonged walking/activity   Education / Equipment: Encouraged to continue with previously provided HEP  Plan: Patient agrees to discharge.  Patient goals were met. Patient is being discharged due to meeting the stated rehab goals.  ?????      3:59 PM,09/12/16 Elly Modena PT, DPT [facility] [address] [place], [place], [address] Phone: [phone]   Fax:  [phone]  Name: Cathy Wells MRN: [id number] Date of Birth: [birth year]

## 2016-11-22 ENCOUNTER — Emergency Department (HOSPITAL_COMMUNITY)
Admission: EM | Admit: 2016-11-22 | Discharge: 2016-11-23 | Disposition: A | Discharge status: Home / Self Care | Payer: BLUE CROSS/BLUE SHIELD | Attending: Emergency Medicine | Admitting: Emergency Medicine

## 2016-11-22 ENCOUNTER — Encounter (HOSPITAL_COMMUNITY): Payer: Self-pay

## 2016-11-22 DIAGNOSIS — N760 Acute vaginitis: Secondary | ICD-10-CM | POA: Diagnosis not present

## 2016-11-22 DIAGNOSIS — R102 Pelvic and perineal pain: Secondary | ICD-10-CM | POA: Diagnosis not present

## 2016-11-22 DIAGNOSIS — B9689 Other specified bacterial agents as the cause of diseases classified elsewhere: Secondary | ICD-10-CM

## 2016-11-22 DIAGNOSIS — J45909 Unspecified asthma, uncomplicated: Secondary | ICD-10-CM | POA: Insufficient documentation

## 2016-11-22 LAB — CBC WITH DIFFERENTIAL/PLATELET
BASOS ABS: 0.1 10*3/uL (ref 0.0–0.1)
Basophils Relative: 1 %
EOS ABS: 0.2 10*3/uL (ref 0.0–0.7)
EOS PCT: 2 %
HCT: 43.1 % (ref 36.0–46.0)
Hemoglobin: 14.8 g/dL (ref 12.0–15.0)
Lymphocytes Relative: 29 %
Lymphs Abs: 3.5 10*3/uL (ref 0.7–4.0)
MCH: 28.8 pg (ref 26.0–34.0)
MCHC: 34.3 g/dL (ref 30.0–36.0)
MCV: 83.9 fL (ref 78.0–100.0)
MONO ABS: 0.8 10*3/uL (ref 0.1–1.0)
Monocytes Relative: 7 %
Neutro Abs: 7.6 10*3/uL (ref 1.7–7.7)
Neutrophils Relative %: 63 %
PLATELETS: 380 10*3/uL (ref 150–400)
RBC: 5.14 MIL/uL — AB (ref 3.87–5.11)
RDW: 12.3 % (ref 11.5–15.5)
WBC: 12.1 10*3/uL — AB (ref 4.0–10.5)

## 2016-11-22 LAB — BASIC METABOLIC PANEL
Anion gap: 8 (ref 5–15)
BUN: 10 mg/dL (ref 6–20)
CALCIUM: 9 mg/dL (ref 8.9–10.3)
CO2: 25 mmol/L (ref 22–32)
CREATININE: 0.7 mg/dL (ref 0.44–1.00)
Chloride: 103 mmol/L (ref 101–111)
GFR calc Af Amer: >60 mL/min (ref >60)
GLUCOSE: 91 mg/dL (ref 65–99)
Potassium: 3.7 mmol/L (ref 3.5–5.1)
SODIUM: 136 mmol/L (ref 135–145)

## 2016-11-22 LAB — I-STAT BETA HCG BLOOD, ED (MC, WL, AP ONLY)

## 2016-11-22 LAB — WET PREP, GENITAL
SPERM: NONE SEEN
TRICH WET PREP: NONE SEEN
YEAST WET PREP: NONE SEEN

## 2016-11-22 LAB — URINALYSIS, ROUTINE W REFLEX MICROSCOPIC
BILIRUBIN URINE: NEGATIVE
Glucose, UA: NEGATIVE mg/dL
Ketones, ur: NEGATIVE mg/dL
NITRITE: NEGATIVE
PH: 6 (ref 5.0–8.0)
Protein, ur: NEGATIVE mg/dL
SPECIFIC GRAVITY, URINE: 1.014 (ref 1.005–1.030)

## 2016-11-22 LAB — PREGNANCY, URINE: Preg Test, Ur: NEGATIVE

## 2016-11-22 MED ORDER — HYDROCODONE-ACETAMINOPHEN 5-325 MG PO TABS: ORAL_TABLET | ORAL | 0 refills | Status: DC

## 2016-11-22 MED ORDER — METRONIDAZOLE 500 MG PO TABS: 500.0000 mg | ORAL_TABLET | Freq: Two times a day (BID) | ORAL | 0 refills | Status: DC

## 2016-11-22 NOTE — ED Triage Notes (Signed)
Patient states that she is having right pelvic pain that started this morning.  Pain does not radiate.  States that the pain is constant but also becomes sharp at times.  Patient also has not had a period since November, but has taken several pregnancy tests that were negative.  Patient also has a history of UTI.

## 2016-11-22 NOTE — ED Triage Notes (Signed)
Patient states that she also has a history of BV and it kind of feels the same from what she remembers.

## 2016-11-22 NOTE — ED Triage Notes (Signed)
Patient states that her pain is worse when she is up moving around.

## 2016-11-22 NOTE — Discharge Instructions (Signed)
As discussed, you have an appt here tomorrow morning at 9:30 am for a pelvic ultrasound.  Arrive 15 minutes early to register and drink 32 oz of water approximately one hour before your appt time and hold your urine.  Call [facility] to arrange a follow-up appt.  Continue to take ibuprofen 600 mg every 6-8 hrs as needed for pain

## 2016-11-23 ENCOUNTER — Ambulatory Visit (HOSPITAL_COMMUNITY)
Admission: RE | Admit: 2016-11-23 | Discharge: 2016-11-23 | Disposition: A | Discharge status: Home / Self Care | Payer: BLUE CROSS/BLUE SHIELD | Source: Ambulatory Visit | Attending: Emergency Medicine | Admitting: Emergency Medicine

## 2016-11-23 ENCOUNTER — Other Ambulatory Visit (HOSPITAL_COMMUNITY): Payer: Self-pay | Admitting: Emergency Medicine

## 2016-11-23 DIAGNOSIS — N8301 Follicular cyst of right ovary: Secondary | ICD-10-CM | POA: Insufficient documentation

## 2016-11-23 DIAGNOSIS — R1031 Right lower quadrant pain: Secondary | ICD-10-CM

## 2016-11-23 DIAGNOSIS — N83201 Unspecified ovarian cyst, right side: Secondary | ICD-10-CM | POA: Diagnosis not present

## 2016-11-23 NOTE — ED Provider Notes (Signed)
[facility]-EM[facility] DEPT Provider Note   CSN: [id number] Arrival date & time: 11/22/16  2009     History   Chief Complaint Chief Complaint  Patient presents with  . Pelvic Pain    HPI Cathy Wells is a 26 y.o. female.  HPI  Cathy Wells is a 26 y.o. female who presents to the Emergency Department complaining of right lower pelvic pain.  Symptoms present for several days.  She describes a waxing and waning pain that does not radiate.  Pain is worse with movement and improves at rest.  She denies fever, vomiting, vaginal bleeding or discharge.  Pain not controlled with OTC medications  She has taken several home pregnancy tests that were all negative. No period for two months.  She also denies dysuria and flank pain.     Past Medical History:  Diagnosis Date  . Allergy   . Asthma   . Pelvic pain in female 05/27/2015  . Reflux   . Urinary frequency 05/27/2015  . UTI (lower urinary tract infection) 04/28/2015    Patient Active Problem List   Diagnosis Date Noted  . Urinary frequency 05/27/2015  . Pelvic pain in female 05/27/2015  . UTI (lower urinary tract infection) 04/28/2015    Past Surgical History:  Procedure Laterality Date  . APPENDECTOMY      OB History    Gravida Para Term Preterm AB Living   0 0 0 0 0 0   SAB TAB Ectopic Multiple Live Births   0 0 0 0         Home Medications    Prior to Admission medications   Medication Sig Start Date End Date Taking? Authorizing Provider  HYDROcodone-acetaminophen (NORCO/VICODIN) 5-325 MG tablet Take one tab po q 4-6 hrs prn pain 11/22/16   Zaccai Chavarin, PA-C  ibuprofen (ADVIL,MOTRIN) 200 MG tablet Take 600 mg by mouth every 6 (six) hours as needed for mild pain or moderate pain.    Historical Provider, MD  lansoprazole (PREVACID) 30 MG capsule Take 30 mg by mouth daily at 12 noon.    Historical Provider, MD  metroNIDAZOLE (FLAGYL) 500 MG tablet Take 1 tablet (500 mg total) by mouth 2 (two) times daily. 11/22/16    Iyonnah Ferrante, PA-C  naproxen (NAPROSYN) 500 MG tablet Take 1 po BID with food prn pain 07/06/16   Devoria Albe, MD    Family History Family History  Problem Relation Age of Onset  . Hypertension Mother   . Asthma Father   . Hypertension Father   . Hypertension Maternal Grandfather   . Diabetes Paternal Grandmother   . Hypertension Paternal Grandmother   . COPD Paternal Grandfather     Social History Social History  Substance Use Topics  . Smoking status: Never Smoker  . Smokeless tobacco: Never Used  . Alcohol use No     Allergies   Doxycycline; Amoxapine and related; Azithromycin; Clarithromycin; Penicillins; and Sulfa antibiotics   Review of Systems Review of Systems  Constitutional: Negative for appetite change, chills and fever.  Respiratory: Negative for shortness of breath.   Cardiovascular: Negative for chest pain.  Gastrointestinal: Positive for abdominal pain. Negative for blood in stool, nausea and vomiting.  Genitourinary: Negative for decreased urine volume, difficulty urinating, dysuria, flank pain, vaginal bleeding and vaginal discharge.  Musculoskeletal: Negative for back pain.  Skin: Negative for color change and rash.  Neurological: Negative for dizziness, weakness and numbness.  Hematological: Negative for adenopathy.  All other systems reviewed and are  negative.    Physical Exam Updated Vital Signs BP 140/100 (BP Location: Left Arm)   Pulse 103   Temp 98.2 F (36.8 C) (Oral)   Resp 18   Ht 5\' 2"  (1.575 m)   Wt 95.3 kg   LMP 09/13/2016 (Exact Date)   SpO2 97%   BMI 38.41 kg/m   Physical Exam  Constitutional: She is oriented to person, place, and time. She appears well-developed and well-nourished. No distress.  HENT:  Head: Atraumatic.  Mouth/Throat: Oropharynx is clear and moist.  Neck: Normal range of motion.  Cardiovascular: Normal rate and regular rhythm.   No murmur heard. Pulmonary/Chest: Effort normal and breath sounds normal. No  respiratory distress.  Abdominal: Soft. Normal appearance. There is no hepatosplenomegaly. There is tenderness. There is tenderness at McBurney's point.    RLQ tenderness.  No CVA tenderness  Genitourinary: Uterus normal. Cervix exhibits motion tenderness. Cervix exhibits no discharge. Right adnexum displays tenderness. Right adnexum displays no mass. Left adnexum displays no mass and no tenderness. No bleeding in the vagina. No foreign body in the vagina.  Genitourinary Comments: Exam chaperoned by nursing. Mild CMT, right adnexal tenderness, no masses.  No left adnexal tenderness. Or masses  Musculoskeletal: Normal range of motion.  Lymphadenopathy:    She has no cervical adenopathy.  Neurological: She is alert and oriented to person, place, and time.  Skin: Skin is warm. No rash noted.  Nursing note and vitals reviewed.    ED Treatments / Results  Labs (all labs ordered are listed, but only abnormal results are displayed) Labs Reviewed  WET PREP, GENITAL - Abnormal; Notable for the following:       Result Value   Clue Cells Wet Prep HPF POC PRESENT (*)    WBC, Wet Prep HPF POC MODERATE (*)    All other components within normal limits  URINALYSIS, ROUTINE W REFLEX MICROSCOPIC - Abnormal; Notable for the following:    Hgb urine dipstick SMALL (*)    Leukocytes, UA SMALL (*)    Bacteria, UA RARE (*)    All other components within normal limits  CBC WITH DIFFERENTIAL/PLATELET - Abnormal; Notable for the following:    WBC 12.1 (*)    RBC 5.14 (*)    All other components within normal limits  URINE CULTURE  PREGNANCY, URINE  BASIC METABOLIC PANEL  I-STAT BETA HCG BLOOD, ED ([facility], [facility], [facility] ONLY)  GC/CHLAMYDIA PROBE AMP ([place]) NOT AT [facility]    EKG  EKG Interpretation None       Radiology No results found.  Procedures Procedures (including critical care time)  Medications Ordered in ED Medications - No data to display   Initial Impression / Assessment and Plan /  ED Course  I have reviewed the triage vital signs and the nursing notes.  Pertinent labs & imaging results that were available during my care of the patient were reviewed by me and considered in my medical decision making (see chart for details).     Pt non-toxic appearing.  RLQ pain.  Pt scheduled for outpatient abd [place].  Pt agrees to close GYN f/u as pain likely related to ovarian cyst.  Return precautions given  Final Clinical Impressions(s) / ED Diagnoses   Final diagnoses:  Bacterial vaginosis  Pelvic pain in female    New Prescriptions New Prescriptions   HYDROCODONE-ACETAMINOPHEN (NORCO/VICODIN) 5-325 MG TABLET    Take one tab po q 4-6 hrs prn pain   METRONIDAZOLE (FLAGYL) 500 MG TABLET  Take 1 tablet (500 mg total) by mouth 2 (two) times daily.     Pauline Ausammy Ann[place] Nellums, PA-C 11/25/16 1407    Bethann BerkshireJoseph Zammit, MD 11/25/16 [id number]

## 2016-11-23 NOTE — ED Provider Notes (Signed)
I discussed U/S results with patient showing simple right ovarian cyst. She has already made an OBGYN appt   Pricilla LovelessScott Traci Gafford, MD 11/23/16 1200

## 2016-11-24 LAB — URINE CULTURE

## 2016-11-24 LAB — GC/CHLAMYDIA PROBE AMP (~~LOC~~) NOT AT ARMC
Chlamydia: NEGATIVE
NEISSERIA GONORRHEA: NEGATIVE

## 2016-11-25 ENCOUNTER — Telehealth (HOSPITAL_BASED_OUTPATIENT_CLINIC_OR_DEPARTMENT_OTHER): Payer: Self-pay

## 2016-11-25 NOTE — Telephone Encounter (Signed)
Post ED Visit - Positive Culture Follow-up  Culture report reviewed by antimicrobial stewardship pharmacist:  []  Enzo BiNathan Batchelder, Pharm.D. []  Celedonio MiyamotoJeremy Frens, Pharm.D., BCPS []  Garvin FilaMike Maccia, Pharm.D. []  Georgina PillionElizabeth Martin, Pharm.D., BCPS []  YrekaMinh Pham, [address]Ph[address]D., BCPS, AAHIVP []  Estella HuskMichelle Turner, Pharm.D., BCPS, AAHIVP []  Tennis Mustassie Stewart, [address]Pharm.D. []  Rob PinalVincent, [address]Ph[address]Ophelia Shoulder. X  Margaret Shuda, Pharm.D.  Positive urine culture, >/= 100,000 colonies -> Streptococcus Agalactiae Chart reviewed by Fayrene HelperBowie Tran PA "No Change"  Cathy RightClark, Cathy Wells 11/25/2016, 1:54 PM

## 2016-11-29 MED FILL — Hydrocodone-Acetaminophen Tab 5-325 MG: ORAL | Qty: 6 | Status: AC

## 2016-11-30 ENCOUNTER — Encounter: Payer: Self-pay | Admitting: Adult Health

## 2016-11-30 ENCOUNTER — Ambulatory Visit (INDEPENDENT_AMBULATORY_CARE_PROVIDER_SITE_OTHER): Payer: BLUE CROSS/BLUE SHIELD | Admitting: Adult Health

## 2016-11-30 VITALS — BP 116/76 | HR 84 | Ht 62.0 in | Wt 217.5 lb

## 2016-11-30 DIAGNOSIS — N760 Acute vaginitis: Secondary | ICD-10-CM

## 2016-11-30 DIAGNOSIS — R1031 Right lower quadrant pain: Secondary | ICD-10-CM | POA: Diagnosis not present

## 2016-11-30 DIAGNOSIS — N926 Irregular menstruation, unspecified: Secondary | ICD-10-CM | POA: Diagnosis not present

## 2016-11-30 DIAGNOSIS — Z3202 Encounter for pregnancy test, result negative: Secondary | ICD-10-CM

## 2016-11-30 DIAGNOSIS — N898 Other specified noninflammatory disorders of vagina: Secondary | ICD-10-CM

## 2016-11-30 DIAGNOSIS — B9689 Other specified bacterial agents as the cause of diseases classified elsewhere: Secondary | ICD-10-CM | POA: Diagnosis not present

## 2016-11-30 DIAGNOSIS — N83201 Unspecified ovarian cyst, right side: Secondary | ICD-10-CM

## 2016-11-30 LAB — POCT WET PREP (WET MOUNT)
Clue Cells Wet Prep Whiff POC: NEGATIVE
WBC, Wet Prep HPF POC: POSITIVE

## 2016-11-30 LAB — POCT URINE PREGNANCY: Preg Test, Ur: NEGATIVE

## 2016-11-30 MED ORDER — MEDROXYPROGESTERONE ACETATE 10 MG PO TABS: 10.0000 mg | ORAL_TABLET | Freq: Every day | ORAL | 0 refills | Status: DC

## 2016-11-30 NOTE — Progress Notes (Signed)
Subjective:     Patient ID: Cathy Wells, female   DOB: 02/13/1991, 26 y.o.   MRN: [id number]  HPI Cathy SparVictoria is a 26 year old white female in for follow up of going to ER 1/23 for abdominal pap and had right ovarian cyst on US, and has not had period since November and would like to be pregnant. Still has some pain.   Review of Systems +missed periods +pain RLQ Reviewed past medical,surgical, social and family history. Reviewed medications and allergies.     Objective:   Physical Exam BP 116/76 (BP Location: Left Arm, Patient Position: Sitting, Cuff Size: Large)   Pulse 84   Ht 5\' 2"  (1.575 m)   Wt 217 lb 8 oz (98.7 kg)   LMP 09/13/2016   BMI 39.78 kg/m UPT negative, PHQ 2 score 0. Skin warm and dry.Pelvic: external genitalia is normal in appearance no lesions, vagina: white discharge without odor,urethra has no lesions or masses noted, cervix:smooth and no CMT, uterus: normal size, shape and contour, non tender, no masses felt, adnexa: no masses +RLQ tenderness noted. Bladder is non tender and no masses felt. Wet prep: + for clue cells and +WBCs.   Will try to elicit a withdrawal bleed and then bring back to discuss getting pregnant.   Assessment:     1. RLQ abdominal pain   2. Missed periods   3. Right ovarian cyst   4. Pregnancy examination or test, negative result   5. BV (bacterial vaginosis)       Plan:     Meds ordered this encounter  Medications  . medroxyPROGESTERone (PROVERA) 10 MG tablet    Sig: Take 1 tablet (10 mg total) by mouth daily.    Dispense:  10 tablet    Refill:  0    Order Specific Question:   Supervising Provider    Answer:   Lazaro ArmsEURE, LUTHER H [[id number]]  Finish taking flagyl Return in 2 weeks for follow up, will talk about getting pregnant

## 2016-12-14 ENCOUNTER — Encounter: Payer: Self-pay | Admitting: Adult Health

## 2016-12-14 ENCOUNTER — Ambulatory Visit (INDEPENDENT_AMBULATORY_CARE_PROVIDER_SITE_OTHER): Payer: BLUE CROSS/BLUE SHIELD | Admitting: Adult Health

## 2016-12-14 VITALS — BP 120/88 | HR 77 | Ht 62.0 in | Wt 220.0 lb

## 2016-12-14 DIAGNOSIS — N926 Irregular menstruation, unspecified: Secondary | ICD-10-CM | POA: Diagnosis not present

## 2016-12-14 DIAGNOSIS — Z3189 Encounter for other procreative management: Secondary | ICD-10-CM

## 2016-12-14 DIAGNOSIS — Z319 Encounter for procreative management, unspecified: Secondary | ICD-10-CM

## 2016-12-14 MED ORDER — PRENATAL PLUS IRON 29-1 MG PO TABS: ORAL_TABLET | ORAL | 12 refills | Status: DC

## 2016-12-14 NOTE — Progress Notes (Signed)
Subjective:     Patient ID: Cathy Wells, female   DOB: 06/02/1991, 26 y.o.   MRN: [id number]  HPI Cathy Wells is a 26 year old white female, married in to discuss getting pregnant. She did start her period after provera.  Review of Systems Had period after provera Reviewed past medical,surgical, social and family history. Reviewed medications and allergies.     Objective:   Physical Exam BP 120/88 (BP Location: Left Arm, Patient Position: Sitting, Cuff Size: Normal)   Pulse 77   Ht 5\' 2"  (1.575 m)   Wt 220 lb (99.8 kg)   LMP 12/09/2016 (Exact Date)   BMI 40.24 kg/m PHQ 2 score 0. She started her period 2/8 after taking provera and wants to get pregnant.Discussed timing of sex and ovulation, will check TSH and progesterone level, 2/28.And will start on prenatal vitamin. Face time 10 minutes counseling.    Assessment:       1. Irregular periods   2. Patient desires pregnancy       Plan:     Meds ordered this encounter  Medications  . Prenatal Vit-Iron Carbonyl-FA (PRENATAL PLUS IRON) 29-1 MG TABS    Sig: Take 1 daily    Dispense:  30 tablet    Refill:  12    Order Specific Question:   Supervising Provider    Answer:   Lazaro ArmsEURE, LUTHER H [[id number]]  Check TSH and progesterone 2/28 Have sex every other day, days 7-24, pee before and lay there after Follow up prn

## 2016-12-29 DIAGNOSIS — N926 Irregular menstruation, unspecified: Secondary | ICD-10-CM | POA: Diagnosis not present

## 2016-12-30 ENCOUNTER — Telehealth: Payer: Self-pay | Admitting: *Deleted

## 2016-12-30 LAB — TSH: TSH: 2.05 u[IU]/mL (ref 0.450–4.500)

## 2016-12-30 LAB — PROGESTERONE: PROGESTERONE: 11.2 ng/mL

## 2016-12-30 NOTE — Telephone Encounter (Signed)
Get husband to see [facility] for semen alliances

## 2017-02-14 DIAGNOSIS — N390 Urinary tract infection, site not specified: Secondary | ICD-10-CM | POA: Diagnosis not present

## 2017-07-09 ENCOUNTER — Emergency Department (HOSPITAL_COMMUNITY)
Admission: EM | Admit: 2017-07-09 | Discharge: 2017-07-09 | Disposition: A | Discharge status: Home / Self Care | Payer: BLUE CROSS/BLUE SHIELD | Attending: Emergency Medicine | Admitting: Emergency Medicine

## 2017-07-09 ENCOUNTER — Encounter (HOSPITAL_COMMUNITY): Payer: Self-pay | Admitting: *Deleted

## 2017-07-09 ENCOUNTER — Emergency Department (HOSPITAL_COMMUNITY): Payer: BLUE CROSS/BLUE SHIELD

## 2017-07-09 DIAGNOSIS — N200 Calculus of kidney: Secondary | ICD-10-CM | POA: Insufficient documentation

## 2017-07-09 DIAGNOSIS — R1032 Left lower quadrant pain: Secondary | ICD-10-CM | POA: Diagnosis not present

## 2017-07-09 DIAGNOSIS — R1111 Vomiting without nausea: Secondary | ICD-10-CM | POA: Diagnosis not present

## 2017-07-09 DIAGNOSIS — N201 Calculus of ureter: Secondary | ICD-10-CM | POA: Diagnosis not present

## 2017-07-09 DIAGNOSIS — Z791 Long term (current) use of non-steroidal anti-inflammatories (NSAID): Secondary | ICD-10-CM | POA: Diagnosis not present

## 2017-07-09 DIAGNOSIS — Z79899 Other long term (current) drug therapy: Secondary | ICD-10-CM | POA: Insufficient documentation

## 2017-07-09 DIAGNOSIS — J45909 Unspecified asthma, uncomplicated: Secondary | ICD-10-CM | POA: Insufficient documentation

## 2017-07-09 DIAGNOSIS — N132 Hydronephrosis with renal and ureteral calculous obstruction: Secondary | ICD-10-CM | POA: Diagnosis not present

## 2017-07-09 LAB — CBC WITH DIFFERENTIAL/PLATELET
BASOS PCT: 1 %
Basophils Absolute: 0.1 10*3/uL (ref 0.0–0.1)
EOS ABS: 0.1 10*3/uL (ref 0.0–0.7)
EOS PCT: 1 %
HCT: 44.4 % (ref 36.0–46.0)
Hemoglobin: 15.2 g/dL — ABNORMAL HIGH (ref 12.0–15.0)
LYMPHS ABS: 2.3 10*3/uL (ref 0.7–4.0)
Lymphocytes Relative: 19 %
MCH: 28.2 pg (ref 26.0–34.0)
MCHC: 34.2 g/dL (ref 30.0–36.0)
MCV: 82.4 fL (ref 78.0–100.0)
Monocytes Absolute: 0.6 10*3/uL (ref 0.1–1.0)
Monocytes Relative: 5 %
NEUTROS PCT: 74 %
Neutro Abs: 9.1 10*3/uL — ABNORMAL HIGH (ref 1.7–7.7)
PLATELETS: 397 10*3/uL (ref 150–400)
RBC: 5.39 MIL/uL — ABNORMAL HIGH (ref 3.87–5.11)
RDW: 12.6 % (ref 11.5–15.5)
WBC: 12.2 10*3/uL — AB (ref 4.0–10.5)

## 2017-07-09 LAB — URINALYSIS, ROUTINE W REFLEX MICROSCOPIC
BILIRUBIN URINE: NEGATIVE
Glucose, UA: NEGATIVE mg/dL
KETONES UR: 20 mg/dL — AB
LEUKOCYTES UA: NEGATIVE
NITRITE: NEGATIVE
PROTEIN: 30 mg/dL — AB
Specific Gravity, Urine: 1.013 (ref 1.005–1.030)
pH: 8 (ref 5.0–8.0)

## 2017-07-09 LAB — POC URINE PREG, ED: PREG TEST UR: NEGATIVE

## 2017-07-09 LAB — COMPREHENSIVE METABOLIC PANEL
ALBUMIN: 4.7 g/dL (ref 3.5–5.0)
ALT: 26 U/L (ref 14–54)
ANION GAP: 11 (ref 5–15)
AST: 24 U/L (ref 15–41)
Alkaline Phosphatase: 59 U/L (ref 38–126)
BUN: 15 mg/dL (ref 6–20)
CHLORIDE: 105 mmol/L (ref 101–111)
CO2: 23 mmol/L (ref 22–32)
Calcium: 9.6 mg/dL (ref 8.9–10.3)
Creatinine, Ser: 0.93 mg/dL (ref 0.44–1.00)
GFR calc non Af Amer: >60 mL/min (ref >60)
GLUCOSE: 111 mg/dL — AB (ref 65–99)
Potassium: 3.7 mmol/L (ref 3.5–5.1)
SODIUM: 139 mmol/L (ref 135–145)
Total Bilirubin: 1.6 mg/dL — ABNORMAL HIGH (ref 0.3–1.2)
Total Protein: 8.3 g/dL — ABNORMAL HIGH (ref 6.5–8.1)

## 2017-07-09 IMAGING — CT CT RENAL STONE PROTOCOL
2 of 4 series · 16 of 46 positions shown, 18 images · non-contrast
Comparison: [DATE]

CLINICAL DATA: Left-sided flank pain with vomiting

EXAM:
CT ABDOMEN AND PELVIS WITHOUT CONTRAST
TECHNIQUE: Multidetector CT imaging of the abdomen and pelvis was performed
following the standard protocol without IV contrast.

[Series 2: axial st · axial · 0.83mm/px · z∈[+965,+1425]mm · 13 of 101 slices shown, 15 images]
[im 5/101  soft-tissue]
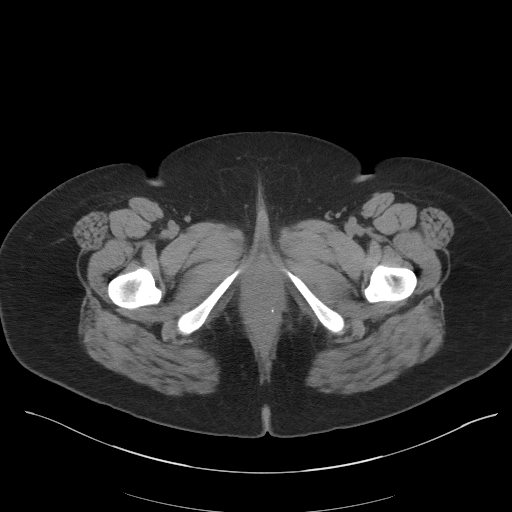
[im 5/101  bone]
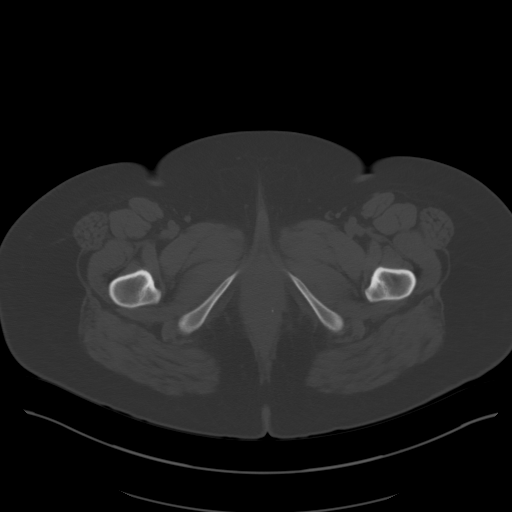
[im 13/101  soft-tissue]
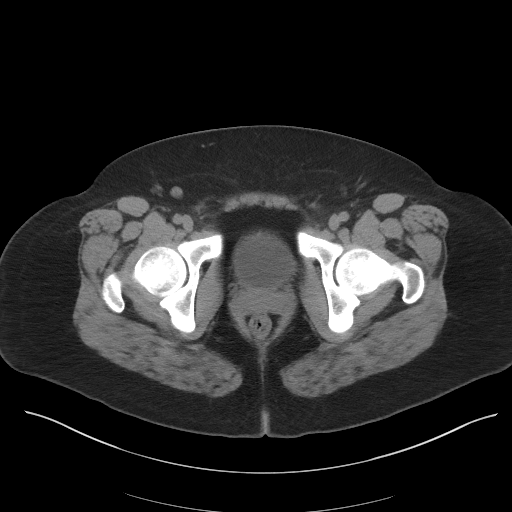
[im 21/101  soft-tissue]
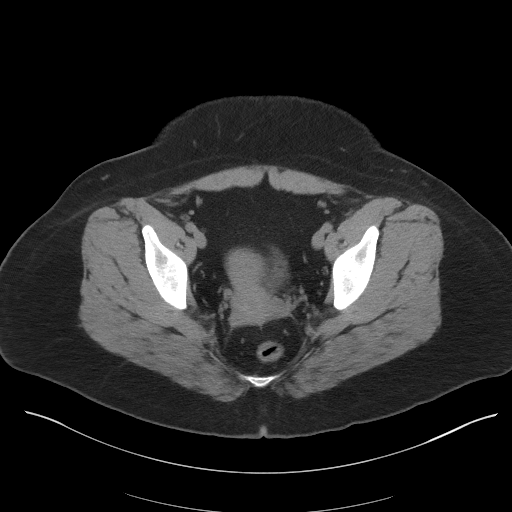
[im 29/101  soft-tissue]
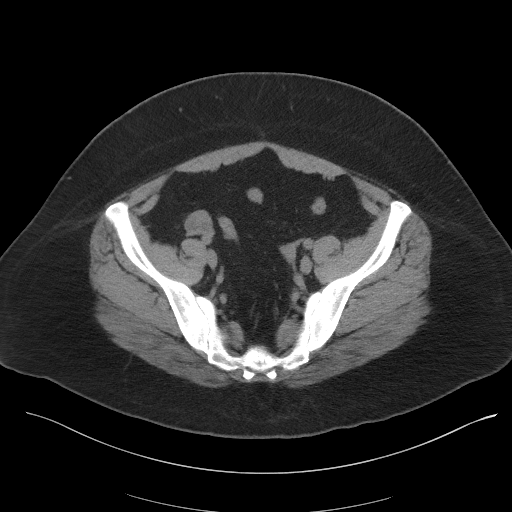
[im 37/101  soft-tissue]
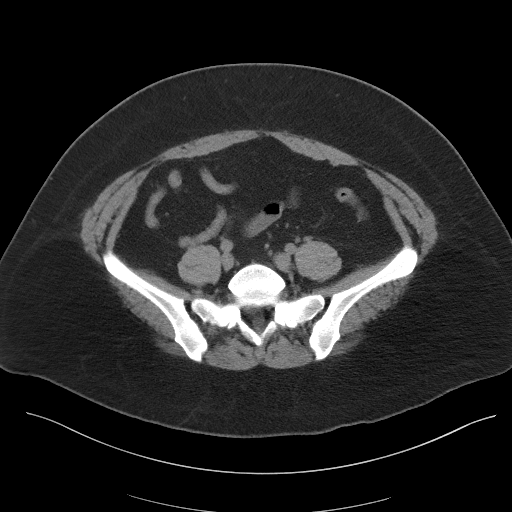
[im 45/101  soft-tissue]
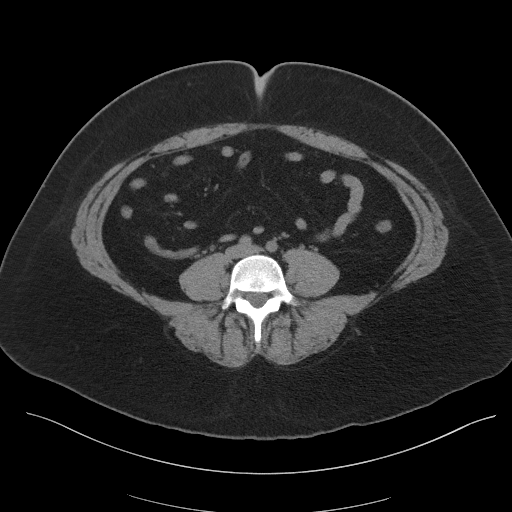
[im 53/101  soft-tissue]
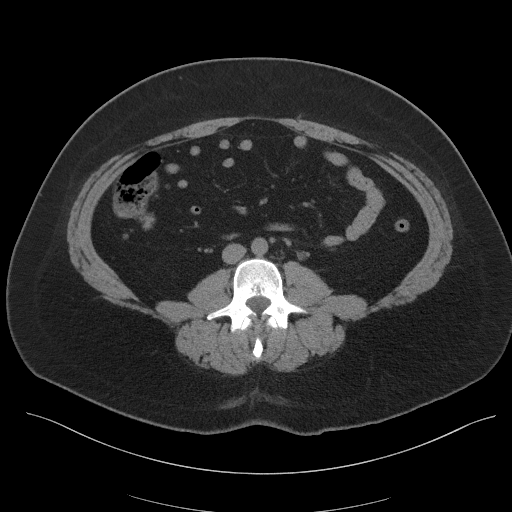
[im 57/101  soft-tissue]
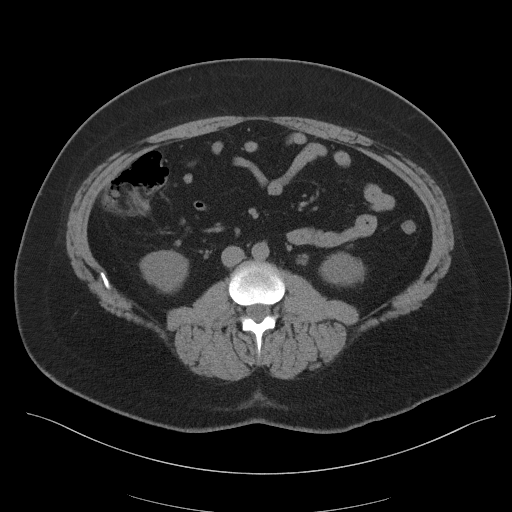
[im 65/101  soft-tissue]
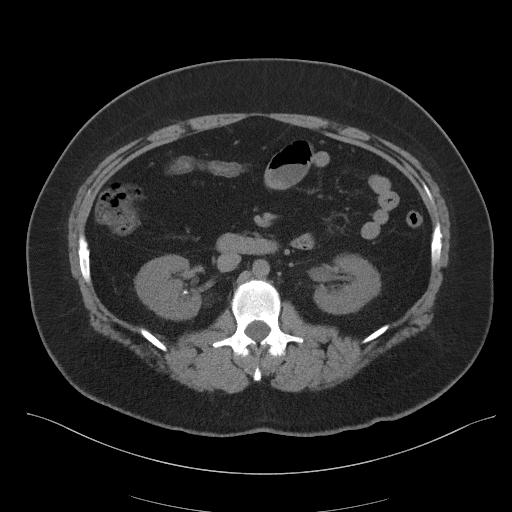
[im 65/101  bone]
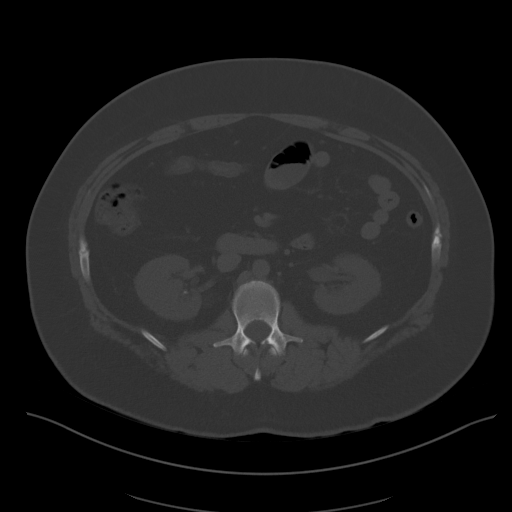
[im 73/101  soft-tissue]
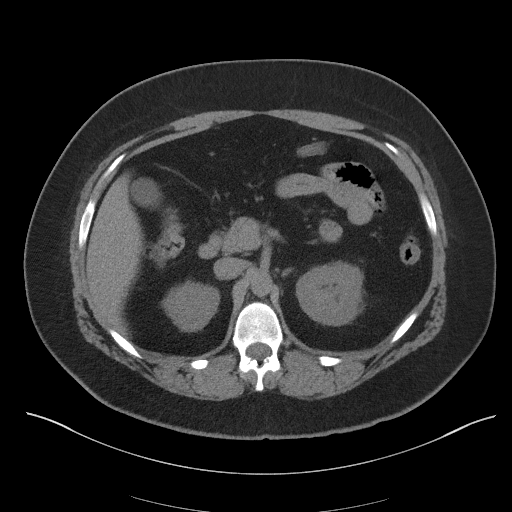
[im 81/101  soft-tissue]
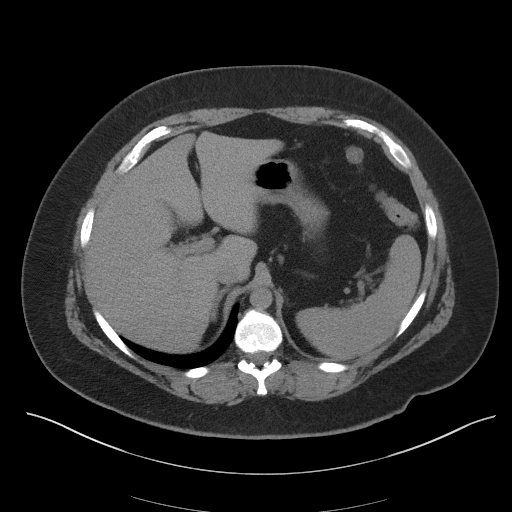
[im 89/101  soft-tissue]
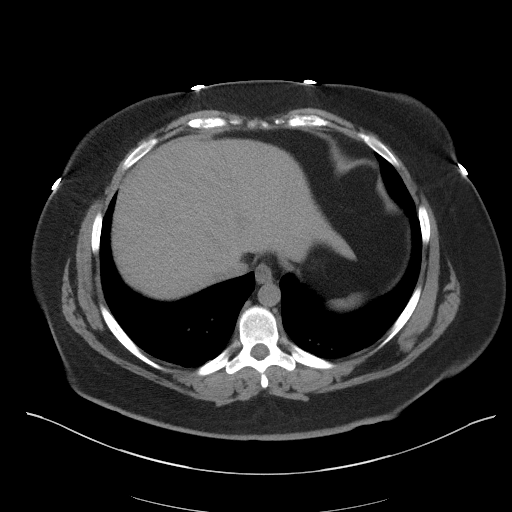
[im 97/101  soft-tissue]
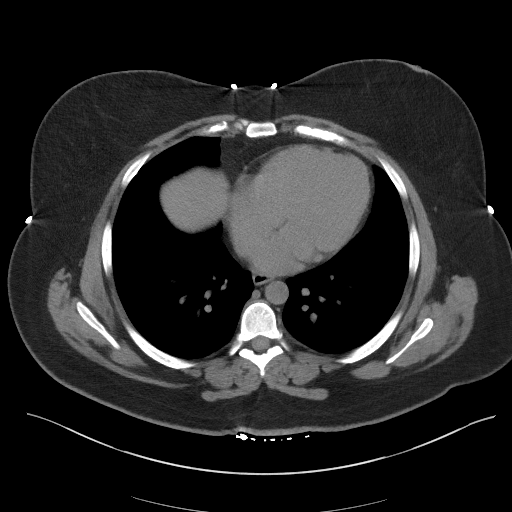

[Series 5: coronal st · coronal · 0.84mm/px · 3 of 101 slices shown]
[im 34/101  soft-tissue]
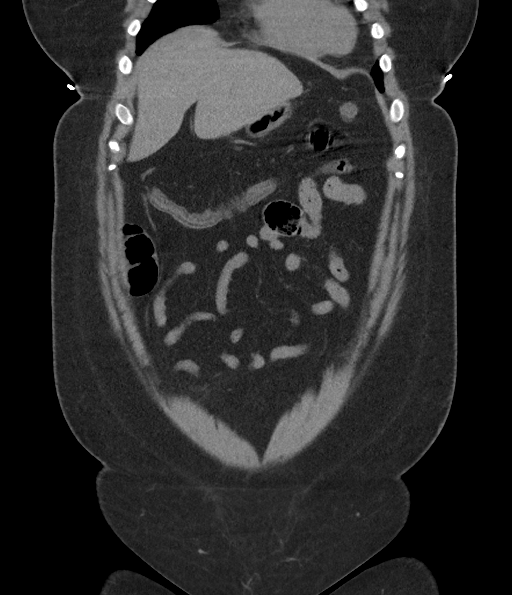
[im 45/101  soft-tissue]
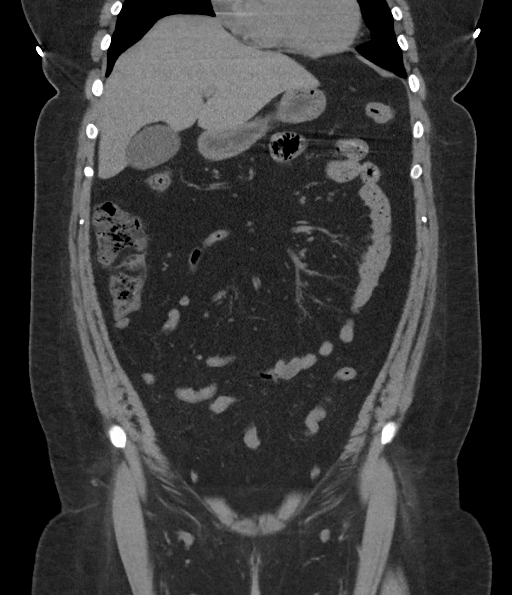
[im 56/101  soft-tissue]
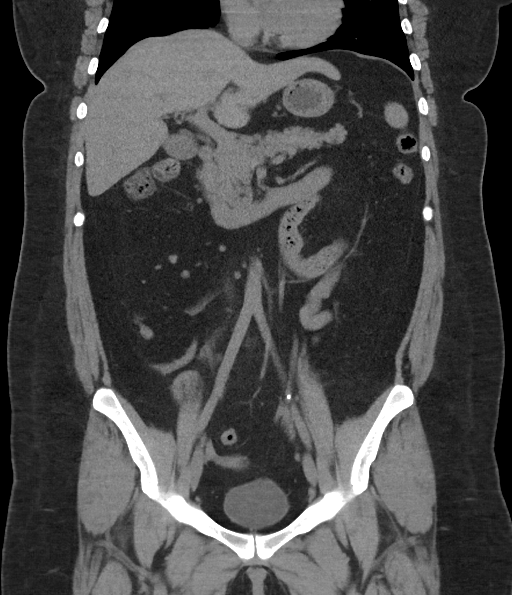

[16 of 46 positions shown; findings below may reference images not displayed]

FINDINGS: Lower chest: No acute abnormality.

Hepatobiliary: No focal liver abnormality is seen. No gallstones,
gallbladder wall thickening, or biliary dilatation.

Pancreas: Unremarkable. No pancreatic ductal dilatation or
surrounding inflammatory changes.

Spleen: Normal in size without focal abnormality.

Adrenals/Urinary Tract: Adrenal glands are within normal limits.
Several punctate nonobstructing stones in the right kidney. Minimal
left hydronephrosis and hydroureter, secondary to a 3 mm stone
within the mid to distal left ureter, just inferior to the pelvic
brim. Bladder normal

Stomach/Bowel: Stomach is within normal limits. Appendix not seen
consistent with history of appendectomy. No evidence of bowel wall
thickening, distention, or inflammatory changes.

Vascular/Lymphatic: No significant vascular findings are present. No
enlarged abdominal or pelvic lymph nodes.

Reproductive: Uterus and bilateral adnexa are unremarkable.

Other: No abdominal wall hernia or abnormality. No abdominopelvic
ascites. Minimal fat in the umbilicus

Musculoskeletal: No acute or suspicious lesion. Mild wedging of T11.
IMPRESSION: 1. Minimal left hydronephrosis and hydroureter, secondary to a 3 mm
stone within the mid to distal left ureter, slightly below the level
of the pelvic brim.
2. Nonobstructing stones in the right kidney

## 2017-07-09 MED ORDER — MORPHINE SULFATE (PF) 4 MG/ML IV SOLN
4.0000 mg | Freq: Once | INTRAVENOUS | Status: AC
  Administered 2017-07-09: 4 mg via INTRAVENOUS
  Filled 2017-07-09: qty 1

## 2017-07-09 MED ORDER — KETOROLAC TROMETHAMINE 30 MG/ML IJ SOLN
30.0000 mg | Freq: Once | INTRAMUSCULAR | Status: AC
  Administered 2017-07-09: 30 mg via INTRAVENOUS
  Filled 2017-07-09: qty 1

## 2017-07-09 MED ORDER — ONDANSETRON HCL 4 MG/2ML IJ SOLN
4.0000 mg | Freq: Once | INTRAMUSCULAR | Status: AC
  Administered 2017-07-09: 4 mg via INTRAVENOUS
  Filled 2017-07-09: qty 2

## 2017-07-09 MED ORDER — ONDANSETRON HCL 4 MG PO TABS: 4.0000 mg | ORAL_TABLET | Freq: Four times a day (QID) | ORAL | 0 refills | Status: DC

## 2017-07-09 MED ORDER — SODIUM CHLORIDE 0.9 % IV BOLUS (SEPSIS)
1000.0000 mL | Freq: Once | INTRAVENOUS | Status: AC
  Administered 2017-07-09: 1000 mL via INTRAVENOUS

## 2017-07-09 MED ORDER — OXYCODONE-ACETAMINOPHEN 5-325 MG PO TABS: 1.0000 | ORAL_TABLET | ORAL | 0 refills | Status: DC | PRN

## 2017-07-09 NOTE — Discharge Instructions (Signed)
You have a 3 mm kidney [place]. Prescription for pain and nausea medicine. Return here if worse or follow-up with urologist. Phone number given.

## 2017-07-09 NOTE — ED Notes (Signed)
POC upreg negative  

## 2017-07-09 NOTE — ED Provider Notes (Signed)
[facility] Provider Note   CSN: [id number] Arrival date & time: 07/09/17  1453     History   Chief Complaint Chief Complaint  Patient presents with  . Flank Pain    HPI Cathy Wells is a 26 y.o. female.  Abrupt onset left flank pain just prior to ED visit without hematuria, dysuria, fever, sweats, chills. No prior history of kidney stones. Severity of pain is severe. Nothing makes symptoms better or worse.      Past Medical History:  Diagnosis Date  . Allergy   . Asthma   . Ovarian cyst   . Pelvic pain in female 05/27/2015  . Reflux   . Urinary frequency 05/27/2015  . UTI (lower urinary tract infection) 04/28/2015    Patient Active Problem List   Diagnosis Date Noted  . Irregular periods 12/14/2016  . RLQ abdominal pain 11/30/2016  . Missed periods 11/30/2016  . Right ovarian cyst 11/30/2016  . Urinary frequency 05/27/2015  . Pelvic pain in female 05/27/2015  . UTI (lower urinary tract infection) 04/28/2015    Past Surgical History:  Procedure Laterality Date  . APPENDECTOMY      OB History    Gravida Para Term Preterm AB Living   0 0 0 0 0 0   SAB TAB Ectopic Multiple Live Births   0 0 0 0         Home Medications    Prior to Admission medications   Medication Sig Start Date End Date Taking? Authorizing Provider  albuterol (PROVENTIL HFA;VENTOLIN HFA) 108 (90 Base) MCG/ACT inhaler Inhale 1-2 puffs into the lungs every 6 (six) hours as needed for wheezing or shortness of breath.   Yes [provider]  naproxen sodium (ANAPROX) 220 MG tablet Take 440 mg by mouth daily as needed.   Yes [provider]  pantoprazole (PROTONIX) 40 MG tablet Take 40 mg by mouth daily as needed.   Yes [provider]  ondansetron (ZOFRAN) 4 MG tablet Take 1 tablet (4 mg total) by mouth every 6 (six) hours. 07/09/17   Donnetta Hutching, MD  oxyCODONE-acetaminophen (PERCOCET) 5-325 MG tablet Take 1-2 tablets by mouth every 4 (four) hours as  needed. 07/09/17   Donnetta Hutching, MD    Family History Family History  Problem Relation Age of Onset  . Hypertension Mother   . Asthma Father   . Hypertension Father   . Hypertension Maternal Grandfather   . Diabetes Paternal Grandmother   . Hypertension Paternal Grandmother   . COPD Paternal Grandfather     Social History Social History  Substance Use Topics  . Smoking status: Never Smoker  . Smokeless tobacco: Never Used  . Alcohol use No     Allergies   Doxycycline; Amoxapine and related; Azithromycin; Clarithromycin; Erythromycin base; Penicillins; and Sulfa antibiotics   Review of Systems Review of Systems  All other systems reviewed and are negative.    Physical Exam Updated Vital Signs BP 119/81   Pulse 75   Temp 98.2 F (36.8 C) (Oral)   Resp 18   Ht  (1.575 m)   Wt 100.2 kg (221 lb)   LMP 07/04/2017 (Exact Date) Comment: neg u preg  SpO2 99%   BMI 40.42 kg/m   Physical Exam  Constitutional: She is oriented to person, place, and time. She appears well-developed and well-nourished.  HENT:  Head: Normocephalic and atraumatic.  Eyes: Conjunctivae are normal.  Neck: Neck supple.  Cardiovascular: Normal rate and regular rhythm.  Pulmonary/Chest: Effort normal and breath sounds normal.  Abdominal:  Tender left lower quadrant  Musculoskeletal: Normal range of motion.  Neurological: She is alert and oriented to person, place, and time.  Skin: Skin is warm and dry.  Psychiatric: She has a normal mood and affect. Her behavior is normal.  Nursing note and vitals reviewed.    ED Treatments / Results  Labs (all labs ordered are listed, but only abnormal results are displayed) Labs Reviewed  CBC WITH DIFFERENTIAL/PLATELET - Abnormal; Notable for the following:       Result Value   WBC 12.2 (*)    RBC 5.39 (*)    Hemoglobin 15.2 (*)    Neutro Abs 9.1 (*)    All other components within normal limits  COMPREHENSIVE METABOLIC PANEL - Abnormal;  Notable for the following:    Glucose, Bld 111 (*)    Total Protein 8.3 (*)    Total Bilirubin 1.6 (*)    All other components within normal limits  URINALYSIS, ROUTINE W REFLEX MICROSCOPIC - Abnormal; Notable for the following:    APPearance HAZY (*)    Hgb urine dipstick LARGE (*)    Ketones, ur 20 (*)    Protein, ur 30 (*)    Bacteria, UA RARE (*)    Squamous Epithelial / LPF 0-5 (*)    All other components within normal limits  POC URINE PREG, ED    EKG  EKG Interpretation None       Radiology Ct Renal [place] Study  Result Date: 07/09/2017 CLINICAL DATA:  Left-sided flank pain with vomiting EXAM: CT ABDOMEN AND PELVIS WITHOUT CONTRAST TECHNIQUE: Multidetector CT imaging of the abdomen and pelvis was performed following the standard protocol without IV contrast. COMPARISON:  03/18/2008 FINDINGS: Lower chest: No acute abnormality. Hepatobiliary: No focal liver abnormality is seen. No gallstones, gallbladder wall thickening, or biliary dilatation. Pancreas: Unremarkable. No pancreatic ductal dilatation or surrounding inflammatory changes. Spleen: Normal in size without focal abnormality. Adrenals/Urinary Tract: Adrenal glands are within normal limits. Several punctate nonobstructing stones in the right kidney. Minimal left hydronephrosis and hydroureter, secondary to a 3 mm [place] within the mid to distal left ureter, just inferior to the pelvic brim. Bladder normal Stomach/Bowel: Stomach is within normal limits. Appendix not seen consistent with history of appendectomy. No evidence of bowel wall thickening, distention, or inflammatory changes. Vascular/Lymphatic: No significant vascular findings are present. No enlarged abdominal or pelvic lymph nodes. Reproductive: Uterus and bilateral adnexa are unremarkable. Other: No abdominal wall hernia or abnormality. No abdominopelvic ascites. Minimal fat in the umbilicus Musculoskeletal: No acute or suspicious lesion. Mild wedging of T11.  IMPRESSION: 1. Minimal left hydronephrosis and hydroureter, secondary to a 3 mm [place] within the mid to distal left ureter, slightly below the level of the pelvic brim. 2. Nonobstructing stones in the right kidney Electronically Signed   By: Jasmine Pang M.D.   On: 07/09/2017 17:56    Procedures Procedures (including critical care time)  Medications Ordered in ED Medications  sodium chloride 0.9 % bolus 1,000 mL (0 mLs Intravenous Stopped 07/09/17 1804)  ondansetron (ZOFRAN) injection 4 mg (4 mg Intravenous Given 07/09/17 1527)  morphine 4 MG/ML injection 4 mg (4 mg Intravenous Given 07/09/17 1530)  ketorolac (TORADOL) 30 MG/ML injection 30 mg (30 mg Intravenous Given 07/09/17 1527)  morphine 4 MG/ML injection 4 mg (4 mg Intravenous Given 07/09/17 1645)  morphine 4 MG/ML injection 4 mg (4 mg Intravenous Given 07/09/17 1839)     Initial  Impression / Assessment and Plan / ED Course  I have reviewed the triage vital signs and the nursing notes.  Pertinent labs & imaging results that were available during my care of the patient were reviewed by me and considered in my medical decision making (see chart for details).     CT scan reveals a 3 mm mid to distal ureteral [place]. Will treat pain. Discharge medications Percocet, Zofran 4 mg.  Referral to urology.  Final Clinical Impressions(s) / ED Diagnoses   Final diagnoses:  Kidney Roblero on left side    New Prescriptions New Prescriptions   ONDANSETRON (ZOFRAN) 4 MG TABLET    Take 1 tablet (4 mg total) by mouth every 6 (six) hours.   OXYCODONE-ACETAMINOPHEN (PERCOCET) 5-325 MG TABLET    Take 1-2 tablets by mouth every 4 (four) hours as needed.     Donnetta Hutchingook, Hayde Kilgour, MD 07/09/17 2003

## 2017-07-09 NOTE — ED Triage Notes (Signed)
Left flank pain with vomiting

## 2017-07-11 DIAGNOSIS — Z6841 Body Mass Index (BMI) 40.0 and over, adult: Secondary | ICD-10-CM | POA: Diagnosis not present

## 2017-07-11 DIAGNOSIS — N200 Calculus of kidney: Secondary | ICD-10-CM | POA: Diagnosis not present

## 2017-07-11 DIAGNOSIS — Z1389 Encounter for screening for other disorder: Secondary | ICD-10-CM | POA: Diagnosis not present

## 2017-07-11 MED FILL — Oxycodone w/ Acetaminophen Tab 5-325 MG: ORAL | Qty: 6 | Status: AC

## 2017-07-12 DIAGNOSIS — R1084 Generalized abdominal pain: Secondary | ICD-10-CM | POA: Diagnosis not present

## 2017-07-19 ENCOUNTER — Other Ambulatory Visit: Payer: Self-pay | Admitting: Urology

## 2017-07-19 DIAGNOSIS — N201 Calculus of ureter: Secondary | ICD-10-CM | POA: Diagnosis not present

## 2017-07-20 ENCOUNTER — Encounter (HOSPITAL_BASED_OUTPATIENT_CLINIC_OR_DEPARTMENT_OTHER): Payer: Self-pay | Admitting: *Deleted

## 2017-07-20 NOTE — Progress Notes (Signed)
NPO AFTER MN.  ARRIVE AT 1000.  CURRENT LAB RESULTS IN CHART AND EPIC.  WILL TAKE PROTONIX AM DOS W/ SIPS OF WATER AND IF NEEDED TAKE OXYCODONE/ ZOFRAN.

## 2017-07-21 ENCOUNTER — Ambulatory Visit (HOSPITAL_BASED_OUTPATIENT_CLINIC_OR_DEPARTMENT_OTHER): Payer: BLUE CROSS/BLUE SHIELD | Admitting: Anesthesiology

## 2017-07-21 ENCOUNTER — Encounter (HOSPITAL_BASED_OUTPATIENT_CLINIC_OR_DEPARTMENT_OTHER): Payer: Self-pay

## 2017-07-21 ENCOUNTER — Ambulatory Visit (HOSPITAL_BASED_OUTPATIENT_CLINIC_OR_DEPARTMENT_OTHER)
Admission: RE | Admit: 2017-07-21 | Discharge: 2017-07-21 | Disposition: A | Discharge status: Home / Self Care | Payer: BLUE CROSS/BLUE SHIELD | Source: Ambulatory Visit | Attending: Urology | Admitting: Urology

## 2017-07-21 ENCOUNTER — Encounter (HOSPITAL_BASED_OUTPATIENT_CLINIC_OR_DEPARTMENT_OTHER)
Admission: RE | Disposition: A | Discharge status: Home / Self Care | Payer: Self-pay | Source: Ambulatory Visit | Attending: Urology

## 2017-07-21 DIAGNOSIS — K219 Gastro-esophageal reflux disease without esophagitis: Secondary | ICD-10-CM | POA: Insufficient documentation

## 2017-07-21 DIAGNOSIS — Z79899 Other long term (current) drug therapy: Secondary | ICD-10-CM | POA: Insufficient documentation

## 2017-07-21 DIAGNOSIS — Z791 Long term (current) use of non-steroidal anti-inflammatories (NSAID): Secondary | ICD-10-CM | POA: Insufficient documentation

## 2017-07-21 DIAGNOSIS — N201 Calculus of ureter: Secondary | ICD-10-CM | POA: Diagnosis not present

## 2017-07-21 DIAGNOSIS — J45909 Unspecified asthma, uncomplicated: Secondary | ICD-10-CM | POA: Diagnosis not present

## 2017-07-21 DIAGNOSIS — N390 Urinary tract infection, site not specified: Secondary | ICD-10-CM | POA: Diagnosis not present

## 2017-07-21 HISTORY — DX: Calculus of ureter: N20.1

## 2017-07-21 HISTORY — DX: Adverse effect of unspecified anesthetic, initial encounter: T41.45XA

## 2017-07-21 HISTORY — DX: Urgency of urination: R39.15

## 2017-07-21 HISTORY — DX: Frequency of micturition: R35.0

## 2017-07-21 HISTORY — DX: Hematuria, unspecified: R31.9

## 2017-07-21 HISTORY — DX: Other complications of anesthesia, initial encounter: T88.59XA

## 2017-07-21 HISTORY — DX: Presence of spectacles and contact lenses: Z97.3

## 2017-07-21 HISTORY — DX: Gastro-esophageal reflux disease without esophagitis: K21.9

## 2017-07-21 HISTORY — DX: Unspecified asthma, uncomplicated: J45.909

## 2017-07-21 HISTORY — PX: URETEROSCOPY WITH HOLMIUM LASER LITHOTRIPSY: SHX6645

## 2017-07-21 SURGERY — URETEROSCOPY, WITH LITHOTRIPSY USING HOLMIUM LASER
Anesthesia: General | Site: Renal | Laterality: Left

## 2017-07-21 MED ORDER — ONDANSETRON HCL 4 MG/2ML IJ SOLN
4.0000 mg | Freq: Four times a day (QID) | INTRAMUSCULAR | Status: DC | PRN
  Filled 2017-07-21: qty 2

## 2017-07-21 MED ORDER — SCOPOLAMINE 1 MG/3DAYS TD PT72
MEDICATED_PATCH | TRANSDERMAL | Status: AC
  Filled 2017-07-21: qty 1

## 2017-07-21 MED ORDER — IOHEXOL 300 MG/ML  SOLN
INTRAMUSCULAR | Status: DC | PRN
  Administered 2017-07-21: 5 mL

## 2017-07-21 MED ORDER — ONDANSETRON HCL 4 MG/2ML IJ SOLN
INTRAMUSCULAR | Status: AC
  Filled 2017-07-21: qty 2

## 2017-07-21 MED ORDER — KETOROLAC TROMETHAMINE 30 MG/ML IJ SOLN
INTRAMUSCULAR | Status: DC | PRN
  Administered 2017-07-21: 30 mg via INTRAVENOUS

## 2017-07-21 MED ORDER — KETOROLAC TROMETHAMINE 30 MG/ML IJ SOLN
INTRAMUSCULAR | Status: AC
  Filled 2017-07-21: qty 1

## 2017-07-21 MED ORDER — ONDANSETRON HCL 4 MG/2ML IJ SOLN
INTRAMUSCULAR | Status: DC | PRN
  Administered 2017-07-21: 4 mg via INTRAVENOUS

## 2017-07-21 MED ORDER — PROPOFOL 10 MG/ML IV BOLUS
INTRAVENOUS | Status: DC | PRN
  Administered 2017-07-21: 200 mg via INTRAVENOUS

## 2017-07-21 MED ORDER — CIPROFLOXACIN HCL 500 MG PO TABS: 500.0000 mg | ORAL_TABLET | Freq: Once | ORAL | 0 refills | Status: AC

## 2017-07-21 MED ORDER — LACTATED RINGERS IV SOLN
INTRAVENOUS | Status: DC
  Administered 2017-07-21: 12:00:00 via INTRAVENOUS
  Filled 2017-07-21: qty 1000

## 2017-07-21 MED ORDER — OXYCODONE HCL 5 MG PO TABS
5.0000 mg | ORAL_TABLET | Freq: Once | ORAL | Status: DC | PRN
  Filled 2017-07-21: qty 1

## 2017-07-21 MED ORDER — MIDAZOLAM HCL 5 MG/5ML IJ SOLN
INTRAMUSCULAR | Status: DC | PRN
  Administered 2017-07-21: 2 mg via INTRAVENOUS

## 2017-07-21 MED ORDER — FENTANYL CITRATE (PF) 100 MCG/2ML IJ SOLN
INTRAMUSCULAR | Status: AC
  Filled 2017-07-21: qty 2

## 2017-07-21 MED ORDER — LIDOCAINE 2% (20 MG/ML) 5 ML SYRINGE
INTRAMUSCULAR | Status: DC | PRN
  Administered 2017-07-21: 100 mg via INTRAVENOUS

## 2017-07-21 MED ORDER — FENTANYL CITRATE (PF) 100 MCG/2ML IJ SOLN
25.0000 ug | INTRAMUSCULAR | Status: DC | PRN
  Administered 2017-07-21 (×2): 50 ug via INTRAVENOUS
  Filled 2017-07-21: qty 1

## 2017-07-21 MED ORDER — CEFAZOLIN SODIUM-DEXTROSE 2-4 GM/100ML-% IV SOLN
2.0000 g | INTRAVENOUS | Status: AC
  Administered 2017-07-21: 2 g via INTRAVENOUS
  Filled 2017-07-21: qty 100

## 2017-07-21 MED ORDER — PROPOFOL 10 MG/ML IV BOLUS
INTRAVENOUS | Status: AC
  Filled 2017-07-21: qty 20

## 2017-07-21 MED ORDER — FENTANYL CITRATE (PF) 100 MCG/2ML IJ SOLN
INTRAMUSCULAR | Status: DC | PRN
  Administered 2017-07-21: 50 ug via INTRAVENOUS
  Administered 2017-07-21: 25 ug via INTRAVENOUS

## 2017-07-21 MED ORDER — DEXAMETHASONE SODIUM PHOSPHATE 10 MG/ML IJ SOLN
INTRAMUSCULAR | Status: AC
  Filled 2017-07-21: qty 1

## 2017-07-21 MED ORDER — DEXAMETHASONE SODIUM PHOSPHATE 4 MG/ML IJ SOLN
INTRAMUSCULAR | Status: DC | PRN
  Administered 2017-07-21: 10 mg via INTRAVENOUS

## 2017-07-21 MED ORDER — LIDOCAINE 2% (20 MG/ML) 5 ML SYRINGE
INTRAMUSCULAR | Status: AC
  Filled 2017-07-21: qty 5

## 2017-07-21 MED ORDER — SODIUM CHLORIDE 0.9 % IR SOLN
Status: DC | PRN
Start: 2017-07-21 — End: 2017-07-21
  Administered 2017-07-21: 4000 mL

## 2017-07-21 MED ORDER — OXYCODONE HCL 5 MG/5ML PO SOLN
5.0000 mg | Freq: Once | ORAL | Status: DC | PRN
  Filled 2017-07-21: qty 5

## 2017-07-21 MED ORDER — MIDAZOLAM HCL 2 MG/2ML IJ SOLN
INTRAMUSCULAR | Status: AC
  Filled 2017-07-21: qty 2

## 2017-07-21 MED ORDER — CEFAZOLIN SODIUM-DEXTROSE 2-4 GM/100ML-% IV SOLN
INTRAVENOUS | Status: AC
  Filled 2017-07-21: qty 100

## 2017-07-21 SURGICAL SUPPLY — 34 items
BAG DRAIN URO-CYSTO SKYTR STRL (DRAIN) ×3 IMPLANT
BAG DRN UROCATH (DRAIN) ×1
BASKET DAKOTA 1.9FR 11X120 (BASKET) IMPLANT
BASKET LASER NITINOL 1.9FR (BASKET) IMPLANT
BASKET STNLS GEMINI 4WIRE 3FR (BASKET) IMPLANT
BASKET ZERO TIP NITINOL 2.4FR (BASKET) ×2 IMPLANT
BSKT STON RTRVL 120 1.9FR (BASKET)
BSKT STON RTRVL GEM 120X11 3FR (BASKET)
BSKT STON RTRVL ZERO TP 2.4FR (BASKET) ×1
CATH URET 5FR 28IN OPEN ENDED (CATHETERS) ×3 IMPLANT
CATH URET DUAL LUMEN 6-10FR 50 (CATHETERS) IMPLANT
CLOTH BEACON ORANGE TIMEOUT ST (SAFETY) ×3 IMPLANT
FIBER LASER TRAC TIP (UROLOGICAL SUPPLIES) IMPLANT
GLOVE BIO SURGEON STRL SZ7.5 (GLOVE) ×3 IMPLANT
GOWN STRL REUS W/TWL LRG LVL3 (GOWN DISPOSABLE) ×2 IMPLANT
GOWN STRL REUS W/TWL XL LVL3 (GOWN DISPOSABLE) ×3 IMPLANT
GUIDEWIRE 0.038 PTFE COATED (WIRE) ×2 IMPLANT
GUIDEWIRE ANG ZIPWIRE 038X150 (WIRE) IMPLANT
GUIDEWIRE STR DUAL SENSOR (WIRE) ×3 IMPLANT
INFUSOR MANOMETER BAG 3000ML (MISCELLANEOUS) ×3 IMPLANT
IV NS IRRIG 3000ML ARTHROMATIC (IV SOLUTION) ×4 IMPLANT
KIT BALLIN UROMAX 15FX10 (LABEL) IMPLANT
KIT BALLN UROMAX 15FX4 (MISCELLANEOUS) IMPLANT
KIT BALLN UROMAX 26 75X4 (MISCELLANEOUS)
KIT RM TURNOVER CYSTO AR (KITS) ×3 IMPLANT
MANIFOLD NEPTUNE II (INSTRUMENTS) ×2 IMPLANT
NS IRRIG 500ML POUR BTL (IV SOLUTION) IMPLANT
PACK CYSTO (CUSTOM PROCEDURE TRAY) ×3 IMPLANT
SET HIGH PRES BAL DIL (LABEL)
SHEATH ACCESS URETERAL 38CM (SHEATH) IMPLANT
STENT URET 6FRX24 CONTOUR (STENTS) ×3 IMPLANT
TUBE CONNECTING 12'X1/4 (SUCTIONS) ×1
TUBE CONNECTING 12X1/4 (SUCTIONS) ×1 IMPLANT
WATER STERILE IRR 500ML POUR (IV SOLUTION) ×3 IMPLANT

## 2017-07-21 NOTE — Op Note (Signed)
Preoperative diagnosis: left ureteral calculus  Postoperative diagnosis: left ureteral calculus  Procedure:  1. Cystoscopy 2. left ureteroscopy and [place] removal 3. Ureteroscopic laser lithotripsy 4. left 15F x 24 ureteral stent placement  5. left retrograde pyelography with interpretation  Surgeon: Crist Fat, MD  Anesthesia: General  Complications: None  Intraoperative findings: left retrograde pyelography demonstrated a filling defect within the left ureter consistent with the patient's known calculus without other abnormalities.  EBL: Minimal  Specimens: 1. left ureteral calculus  Disposition of specimens: [facility] for Brott analysis  Indication: Cathy Wells is a 27 y.o.   patient with a left ureteral Mapel and associated left symptoms. After reviewing the management options for treatment, the patient elected to proceed with the above surgical procedure(s). We have discussed the potential benefits and risks of the procedure, side effects of the proposed treatment, the likelihood of the patient achieving the goals of the procedure, and any potential problems that might occur during the procedure or recuperation. Informed consent has been obtained.   Description of procedure:  The patient was taken to the operating room and general anesthesia was induced.  The patient was placed in the dorsal lithotomy position, prepped and draped in the usual sterile fashion, and preoperative antibiotics were administered. A preoperative time-out was performed.   Cystourethroscopy was performed.  The patient's urethra was examined and was normal.  The bladder was then systematically examined in its entirety. There was no evidence for any bladder tumors, stones, or other mucosal pathology.    Attention then turned to the left ureteral orifice and a ureteral catheter was used to intubate the ureteral orifice.  Omnipaque contrast was injected through the ureteral  catheter and a retrograde pyelogram was performed with findings as dictated above.  A 0.38 sensor guidewire was then advanced up the left ureter into the renal pelvis under fluoroscopic guidance. The 6 Fr semirigid ureteroscope was then advanced into the ureter next to the guidewire and the calculus was identified.  All stones were then removed from the ureter with an N-gage nitinol basket.  Reinspection of the ureter revealed no remaining visible stones or fragments.   The wire was then backloaded through the cystoscope and a ureteral stent was advance over the wire using Seldinger technique.  The stent was positioned appropriately under fluoroscopic and cystoscopic guidance.  The wire was then removed with an adequate stent curl noted in the renal pelvis as well as in the bladder.  The bladder was then emptied and the procedure ended.  The patient appeared to tolerate the procedure well and without complications.  The patient was able to be awakened and transferred to the recovery unit in satisfactory condition.   Disposition: The tether of the stent was left on and tucked inside the patient's vagina.  Instructions for removing the stent have been provided to the patient. The patient has been scheduled for followup in 6 weeks with a renal ultrasound.

## 2017-07-21 NOTE — Anesthesia Preprocedure Evaluation (Signed)
Anesthesia Evaluation  Patient identified by MRN, date of birth, ID band Patient awake    Reviewed: Allergy & Precautions, H&P , NPO status , Patient's Chart, lab work & pertinent test results  Airway Mallampati: II   Neck ROM: full    Dental   Pulmonary asthma ,    breath sounds clear to auscultation       Cardiovascular negative cardio ROS   Rhythm:regular Rate:Normal     Neuro/Psych    GI/Hepatic GERD  ,  Endo/Other  Morbid obesity  Renal/GU      Musculoskeletal   Abdominal   Peds  Hematology   Anesthesia Other Findings   Reproductive/Obstetrics                            Anesthesia Physical Anesthesia Plan  ASA: II  Anesthesia Plan: General   Post-op Pain Management:    Induction: Intravenous  PONV Risk Score and Plan: 3 and Ondansetron, Dexamethasone, Midazolam and Treatment may vary due to age or medical condition  Airway Management Planned: LMA  Additional Equipment:   Intra-op Plan:   Post-operative Plan:   Informed Consent: I have reviewed the patients History and Physical, chart, labs and discussed the procedure including the risks, benefits and alternatives for the proposed anesthesia with the patient or authorized representative who has indicated his/her understanding and acceptance.     Plan Discussed with: CRNA, Anesthesiologist and Surgeon  Anesthesia Plan Comments:         Anesthesia Quick Evaluation

## 2017-07-21 NOTE — Anesthesia Procedure Notes (Signed)
Procedure Name: LMA Insertion Date/Time: 07/21/2017 12:09 PM Performed by: Tyrone Nine Pre-anesthesia Checklist: Patient identified, Emergency Drugs available, Suction available, Patient being monitored and Timeout performed Patient Re-evaluated:Patient Re-evaluated prior to induction LMA: LMA inserted LMA Size: 4.0 Placement Confirmation: positive ETCO2,  CO2 detector and breath sounds checked- equal and bilateral Tube secured with: Tape

## 2017-07-21 NOTE — Transfer of Care (Signed)
Immediate Anesthesia Transfer of Care Note  Patient: Cathy Wells  Procedure(s) Performed: Procedure(s): retrogrades, URETEROSCOPY WITH [place] basketry, stent placement (Left)  Patient Location: PACU  Anesthesia Type:General  Level of Consciousness: awake, alert , oriented and patient cooperative  Airway & Oxygen Therapy: Patient Spontanous Breathing and Patient connected to nasal cannula oxygen  Post-op Assessment: Report given to RN and Post -op Vital signs reviewed and stable  Post vital signs: Reviewed and stable  Last Vitals:  Vitals:   07/21/17 1003  BP: (!) 140/98  Pulse: (!) 101  Resp: 16  Temp: 36.5 C  SpO2: 99%    Last Pain:  Vitals:   07/21/17 1044  TempSrc:   PainSc: 6       Patients Stated Pain Goal: 7 (07/21/17 1044)  Complications: No apparent anesthesia complications

## 2017-07-21 NOTE — H&P (Signed)
f/u for obstructing [name]  HPI: Cathy Wells is a 26 year-old female established patient who is here for further eval and management of an obstructing [name].  The patient was last seen 1 week prior.   The patient's [name] is on her left side. The [name] was 3 mm left distal [name]. There are no additional stones within the urinary tract.   The patient has not passed their [name] since her visit. The patient is complaining of nausea, vomiting, flank pain, and hematuria. The patient underwent KUB prior to today's appointment. The patient has been taking Oxycodone, Zofran for their obstructing [name].   Patient presents today for follow-up and has had persistent renal colic symptoms. She had a few breaks that were transient from the pain. She continues to require oxycodone for her symptoms.     ALLERGIES: biaxin Chloromycetin SOLR Doxycycline Erythromycin penicill    MEDICATIONS: Ketorolac Tromethamine 10 mg tablet 1 tablet PO Q 8 H PRN  Tamsulosin Hcl 0.4 mg capsule, ext release 24 hr 1 capsule PO Daily  Azo Bladder Control 300 mg capsule  Oxycodone-Acetaminophen 5 mg-325 mg tablet  Phenergan 12.5 mg suppository, rectal  Zofran Odt 4 mg tablet,disintegrating 1 tablet PO Q 8 H PRN     GU PSH: None   NON-GU PSH: Appendectomy    GU PMH: Flank Pain - 07/12/2017 Acute Cystitis/UTI, Acute cystitis without hematuria - 03/01/2016 Urinary Tract Inf, Unspec site, Urinary tract infection - 02/25/2016    NON-GU PMH: Asthma Encounter for general adult medical examination without abnormal findings, Encounter for preventive health examination GERD    FAMILY HISTORY: kidney [place] - Father   SOCIAL HISTORY: Marital Status: Married Preferred Language: English Current Smoking Status: Patient has never smoked.   Tobacco Use Assessment Completed: Used Tobacco in last 30 days? Has never drank.  Does not drink caffeine.    REVIEW OF SYSTEMS:    GU Review Female:   Patient reports frequent  urination, hard to postpone urination, get up at night to urinate, and stream starts and stops. Patient denies burning /pain with urination, leakage of urine, trouble starting your stream, have to strain to urinate, and being pregnant.  Gastrointestinal (Upper):   Patient reports nausea and vomiting. Patient denies indigestion/ heartburn.  Gastrointestinal (Lower):   Patient denies diarrhea and constipation.  Constitutional:   Patient denies fever, night sweats, weight loss, and fatigue.  Skin:   Patient denies skin rash/ lesion and itching.  Eyes:   Patient denies blurred vision and double vision.  Ears/ Nose/ Throat:   Patient denies sore throat and sinus problems.  Hematologic/Lymphatic:   Patient denies swollen glands and easy bruising.  Cardiovascular:   Patient denies leg swelling and chest pains.  Respiratory:   Patient denies cough and shortness of breath.  Endocrine:   Patient denies excessive thirst.  Musculoskeletal:   Patient reports back pain. Patient denies joint pain.  Neurological:   Patient reports dizziness. Patient denies headaches.  Psychologic:   Patient denies depression and anxiety.   VITAL SIGNS:      07/19/2017 02:08 PM  BP 130/89 mmHg  Pulse 99 /min   MULTI-SYSTEM PHYSICAL EXAMINATION:    Constitutional: Well-nourished. No physical deformities. Normally developed. Good grooming.  Neck: Neck symmetrical, not swollen. Normal tracheal position.  Respiratory: No labored breathing, no use of accessory muscles. Clear to auscultation  Cardiovascular: Normal temperature, normal extremity pulses, no swelling, no varicosities. Regular rate and rhythm  Lymphatic: No enlargement of neck, axillae, groin.  Skin:  No paleness, no jaundice, no cyanosis. No lesion, no ulcer, no rash.  Neurologic / Psychiatric: Oriented to time, oriented to place, oriented to person. No depression, no anxiety, no agitation.  Gastrointestinal: No mass, no tenderness, no rigidity, non obese abdomen.   Eyes: Normal conjunctivae. Normal eyelids.  Ears, Nose, Mouth, and Throat: Left ear no scars, no lesions, no masses. Right ear no scars, no lesions, no masses. Nose no scars, no lesions, no masses. Normal hearing. Normal lips.  Musculoskeletal: Normal gait and station of head and neck.     PAST DATA REVIEWED:  Source Of History:  Patient  X-Ray Review: C.T. Abdomen/Pelvis: Reviewed Films. 3 Millimeter left midureteral Omar    PROCEDURES:         KUB - 16109  A single view of the abdomen is obtained. Renal shadows are easily visualized bilaterally. There are no stones appreciated within the expected location in either renal pelvis. There are no additional calcifications along the expected location of either ureter bilaterally.  Gas pattern is grossly normal. No significant bony abnormalities.               Urinalysis w/Scope Dipstick Dipstick Cont'd Micro  Color: Amber Bilirubin: Neg WBC/hpf: 20 - 40/hpf  Appearance: Cloudy Ketones: Neg RBC/hpf: 3 - 10/hpf  Specific Gravity: 1.025 Blood: 2+ Bacteria: Mod (26-50/hpf)  pH: 6.0 Protein: Trace Cystals: NS (Not Seen)  Glucose: Neg Urobilinogen: 0.2 Casts: Hyaline    Nitrites: Neg Trichomonas: Not Present    Leukocyte Esterase: 1+ Mucous: Present      Epithelial Cells: 0 - 5/hpf      Yeast: NS (Not Seen)      Sperm: Not Present         Ketoralac  - 60454, [id number] Qty: 60 Adm. By: Lyndal Rainbow  Unit: mg Lot No [id number]  Route: IM Exp. Date 09/01/2018  Freq: None Mfgr.:   Site: Left Buttock   ASSESSMENT:      ICD-10 Details  1 GU:   Ureteral calculus - N20.1    PLAN:            Medications New Meds: Oxycodone Hcl 5 mg tablet 1-2 tablet PO Q 6 H   #20  0 Refill(s)            Document Letter(s):  Created for Patient: Clinical Summary    I went over the treatment options for their Lords. We discussed ongoing medical expulsion therapy, ESWL and ureteroscopy. Ultimately the patient and I agreed that ureterscopy is  the best option. I went over this surgery with the patient in detail. The patient understands after being put to sleep, we would proceed with a telescope to access the Mi and potentially use a laser to fragment the [place] before removing it with a basket. After removing the Tyer the patient will require temporary stent placement in the ureter. This is an outpatient procedure. I also discussed the potential of not being able to gain access safely into the ureter/kidney. This would require that a stent be placed and then the patient rescheduled several weeks later for a second attempt. They also understand the small risks of ureteral trauma causing a stricture or permanent damage. I also explained the risk of urinary tract infection. Having gone over the procedure itself, the expected outcome, and the risks/benefits the patient has agreed to proceed.

## 2017-07-21 NOTE — Discharge Instructions (Signed)
DISCHARGE INSTRUCTIONS FOR KIDNEY [place]/URETERAL STENT   MEDICATIONS:  1.  Resume all your other meds from home. 3. Take Cipro one hour prior to removal of your stent.   ACTIVITY:  1. No strenuous activity x 1week  2. No driving while on narcotic pain medications  3. Drink plenty of water  4. Continue to walk at home - you can still get blood clots when you are at home, so keep active, but don't over do it.  5. May return to work/school tomorrow or when you feel ready   BATHING:  1. You can shower and we recommend daily showers  2. You have a string coming from your urethra: The stent string is attached to your ureteral stent. Do not pull on this.   SIGNS/SYMPTOMS TO CALL:  Please call us if you have a fever greater than 101.5, uncontrolled nausea/vomiting, uncontrolled pain, dizziness, unable to urinate, bloody urine, chest pain, shortness of breath, leg swelling, leg pain, redness around wound, drainage from wound, or any other concerns or questions.   You can reach [place] at [phone].   FOLLOW-UP:  1. You have an appointment in 6 weeks with a ultrasound of your kidneys prior.  2. You have a string attached to your stent, you may remove it on Monday, September 24th. To do this, pull the strings until the stents are completely removed. You may feel an odd sensation in your back.   Post Anesthesia Home Care Instructions  Activity: Get plenty of rest for the remainder of the day. A responsible individual must stay with you for 24 hours following the procedure.  For the next 24 hours, DO NOT: -Drive a car -Advertising copywriter -Drink alcoholic beverages -Take any medication unless instructed by your physician -Make any legal decisions or sign important papers.  Meals: Start with liquid foods such as gelatin or soup. Progress to regular foods as tolerated. Avoid greasy, spicy, heavy foods. If nausea and/or vomiting occur, drink only clear liquids until the nausea and/or vomiting  subsides. Call your physician if vomiting continues.  Special Instructions/Symptoms: Your throat may feel dry or sore from the anesthesia or the breathing tube placed in your throat during surgery. If this causes discomfort, gargle with warm salt water. The discomfort should disappear within 24 hours.  If you had a scopolamine patch placed behind your ear for the management of post- operative nausea and/or vomiting:  1. The medication in the patch is effective for 72 hours, after which it should be removed.  Wrap patch in a tissue and discard in the trash. Wash hands thoroughly with soap and water. 2. You may remove the patch earlier than 72 hours if you experience unpleasant side effects which may include dry mouth, dizziness or visual disturbances. 3. Avoid touching the patch. Wash your hands with soap and water after contact with the patch.

## 2017-07-22 ENCOUNTER — Encounter (HOSPITAL_BASED_OUTPATIENT_CLINIC_OR_DEPARTMENT_OTHER): Payer: Self-pay | Admitting: Urology

## 2017-07-22 NOTE — Anesthesia Postprocedure Evaluation (Signed)
Anesthesia Post Note  Patient: Cathy Wells  Procedure(s) Performed: Procedure(s) (LRB): retrogrades, URETEROSCOPY WITH [place] basketry, stent placement (Left)     Patient location during evaluation: PACU Anesthesia Type: General Level of consciousness: awake and alert Pain management: pain level controlled Vital Signs Assessment: post-procedure vital signs reviewed and stable Respiratory status: spontaneous breathing, nonlabored ventilation, respiratory function stable and patient connected to nasal cannula oxygen Cardiovascular status: blood pressure returned to baseline and stable Postop Assessment: no apparent nausea or vomiting Anesthetic complications: no    Last Vitals:  Vitals:   07/21/17 1330 07/21/17 1400  BP: (!) 134/92 137/90  Pulse: 89 95  Resp: 13 18  Temp:  36.9 C  SpO2: 92% 95%    Last Pain:  Vitals:   07/21/17 1400  TempSrc: Oral  PainSc: 3                  Kentrell Hallahan S

## 2017-07-27 DIAGNOSIS — N201 Calculus of ureter: Secondary | ICD-10-CM | POA: Diagnosis not present

## 2017-10-18 DIAGNOSIS — H6692 Otitis media, unspecified, left ear: Secondary | ICD-10-CM | POA: Diagnosis not present

## 2017-10-18 DIAGNOSIS — R05 Cough: Secondary | ICD-10-CM | POA: Diagnosis not present

## 2017-10-18 DIAGNOSIS — Z1389 Encounter for screening for other disorder: Secondary | ICD-10-CM | POA: Diagnosis not present

## 2017-10-18 DIAGNOSIS — R0981 Nasal congestion: Secondary | ICD-10-CM | POA: Diagnosis not present

## 2017-10-18 DIAGNOSIS — Z6841 Body Mass Index (BMI) 40.0 and over, adult: Secondary | ICD-10-CM | POA: Diagnosis not present

## 2017-10-18 DIAGNOSIS — J029 Acute pharyngitis, unspecified: Secondary | ICD-10-CM | POA: Diagnosis not present

## 2017-11-10 DIAGNOSIS — H6692 Otitis media, unspecified, left ear: Secondary | ICD-10-CM | POA: Diagnosis not present

## 2017-11-10 DIAGNOSIS — F419 Anxiety disorder, unspecified: Secondary | ICD-10-CM | POA: Diagnosis not present

## 2017-11-10 DIAGNOSIS — Z1389 Encounter for screening for other disorder: Secondary | ICD-10-CM | POA: Diagnosis not present

## 2017-11-10 DIAGNOSIS — Z6841 Body Mass Index (BMI) 40.0 and over, adult: Secondary | ICD-10-CM | POA: Diagnosis not present

## 2018-05-06 DIAGNOSIS — N23 Unspecified renal colic: Secondary | ICD-10-CM | POA: Diagnosis not present

## 2018-05-06 DIAGNOSIS — R109 Unspecified abdominal pain: Secondary | ICD-10-CM | POA: Diagnosis not present

## 2018-05-06 DIAGNOSIS — N200 Calculus of kidney: Secondary | ICD-10-CM | POA: Diagnosis not present

## 2018-05-06 DIAGNOSIS — Z79899 Other long term (current) drug therapy: Secondary | ICD-10-CM | POA: Diagnosis not present

## 2018-05-06 DIAGNOSIS — M545 Low back pain: Secondary | ICD-10-CM | POA: Diagnosis not present

## 2018-06-12 ENCOUNTER — Telehealth: Payer: Self-pay | Admitting: Orthopedic Surgery

## 2018-06-12 NOTE — Telephone Encounter (Signed)
Called back to patient; appointment scheduled. °

## 2018-06-12 NOTE — Telephone Encounter (Signed)
Call from patient - relays she has had a new injury, "rolled her right ankle" last night. Discussed appointment availability of Dr Mort SawyersHarrison's, per patient request. She said that she had also called her primary care doctor, and earliest available is next week. Appointment pending.  Patient aware we will call her back today to discuss options, due to Dr Romeo AppleHarrison returning from being out of clinic for several days. Ph# [phone]

## 2018-06-15 ENCOUNTER — Ambulatory Visit (INDEPENDENT_AMBULATORY_CARE_PROVIDER_SITE_OTHER): Payer: BLUE CROSS/BLUE SHIELD

## 2018-06-15 ENCOUNTER — Encounter: Payer: Self-pay | Admitting: Orthopaedic Surgery

## 2018-06-15 ENCOUNTER — Ambulatory Visit (INDEPENDENT_AMBULATORY_CARE_PROVIDER_SITE_OTHER): Payer: BLUE CROSS/BLUE SHIELD | Admitting: Orthopaedic Surgery

## 2018-06-15 VITALS — BP 137/96 | HR 89 | Ht 62.0 in | Wt 215.0 lb

## 2018-06-15 DIAGNOSIS — S99921A Unspecified injury of right foot, initial encounter: Secondary | ICD-10-CM

## 2018-06-15 DIAGNOSIS — S96911A Strain of unspecified muscle and tendon at ankle and foot level, right foot, initial encounter: Secondary | ICD-10-CM

## 2018-06-15 MED ORDER — HYDROCODONE-ACETAMINOPHEN 5-325 MG PO TABS: ORAL_TABLET | ORAL | 0 refills | Status: DC

## 2018-06-15 NOTE — Patient Instructions (Signed)
Note for out of work since two days ago.  Return in two weeks.  Out until then.  Use CAM walker.

## 2018-06-15 NOTE — Progress Notes (Signed)
Subjective:    Patient ID: Cathy Wells, female    DOB: [birth year], 27 y.o.   MRN: [id number]  HPI She had a misstep two days ago and hurt her right foot and ankle.  She has used ice, elevation and Advil.  She has been out of work.  She has no other injury.  She has pain when trying to walk.  Most of the pain is laterally.   Review of Systems  Constitutional: Positive for activity change.  Respiratory: Positive for shortness of breath. Negative for cough.   Cardiovascular: Negative for chest pain and leg swelling.  Musculoskeletal: Positive for arthralgias, gait problem and joint swelling.  All other systems reviewed and are negative.  For Review of Systems, all other systems reviewed and are negative.  The following is a summary of the past history medically, past history surgically, known current medicines, social history and family history.  This information is gathered electronically by the computer from prior information and documentation.  I review this each visit and have found including this information at this point in the chart is beneficial and informative.   Past Medical History:  Diagnosis Date  . Complication of anesthesia    PONV  . Frequency of urination   . GERD (gastroesophageal reflux disease)   . Hematuria   . Left ureteral [place]   . Mild asthma   . Ovarian cyst   . Urgency of urination   . Wears contact lenses     Past Surgical History:  Procedure Laterality Date  . APPENDECTOMY  07/25/2006   OPEN  . URETEROSCOPY WITH HOLMIUM LASER LITHOTRIPSY Left 07/21/2017   Procedure: retrogrades, URETEROSCOPY WITH [place] basketry, stent placement;  Surgeon: Crist FatHerrick, Benjamin W, MD;  [facility];  Service: Urology;  Laterality: Left;    Current Outpatient Medications on File Prior to Visit  Medication Sig Dispense Refill  . escitalopram (LEXAPRO) 20 MG tablet     . acetaminophen (TYLENOL) 500 MG tablet Take 500 mg by mouth as needed.      Marland Kitchen. albuterol (PROVENTIL HFA;VENTOLIN HFA) 108 (90 Base) MCG/ACT inhaler Inhale 1-2 puffs into the lungs every 6 (six) hours as needed for wheezing or shortness of breath.    . pantoprazole (PROTONIX) 40 MG tablet Take 40 mg by mouth daily as needed.      No current facility-administered medications on file prior to visit.     Social History   Socioeconomic History  . Marital status: Married    Spouse name: Not on file  . Number of children: Not on file  . Years of education: Not on file  . Highest education level: Not on file  Occupational History  . Not on file  Social Needs  . Financial resource strain: Not on file  . Food insecurity:    Worry: Not on file    Inability: Not on file  . Transportation needs:    Medical: Not on file    Non-medical: Not on file  Tobacco Use  . Smoking status: Never Smoker  . Smokeless tobacco: Never Used  Substance and Sexual Activity  . Alcohol use: No  . Drug use: No  . Sexual activity: Yes    Birth control/protection: None  Lifestyle  . Physical activity:    Days per week: Not on file    Minutes per session: Not on file  . Stress: Not on file  Relationships  . Social connections:    Talks on phone: Not  on file    Gets together: Not on file    Attends religious service: Not on file    Active member of club or organization: Not on file    Attends meetings of clubs or organizations: Not on file    Relationship status: Not on file  . Intimate partner violence:    Fear of current or ex partner: Not on file    Emotionally abused: Not on file    Physically abused: Not on file    Forced sexual activity: Not on file  Other Topics Concern  . Not on file  Social History Narrative  . Not on file    Family History  Problem Relation Age of Onset  . Hypertension Mother   . Asthma Father   . Hypertension Father   . Hypertension Maternal Grandfather   . Diabetes Paternal Grandmother   . Hypertension Paternal Grandmother   . COPD  Paternal Grandfather     BP (!) 137/96   Pulse 89   Ht 5\' 2"  (1.575 m)   Wt 215 lb (97.5 kg)   LMP 06/07/2018 (Exact Date)   BMI 39.32 kg/m   Body mass index is 39.32 kg/m.      Objective:   Physical Exam  Constitutional: She is oriented to person, place, and time. She appears well-developed and well-nourished.  HENT:  Head: Normocephalic and atraumatic.  Eyes: Pupils are equal, round, and reactive to light. Conjunctivae and EOM are normal.  Neck: Normal range of motion. Neck supple.  Cardiovascular: Normal rate, regular rhythm and intact distal pulses.  Pulmonary/Chest: Effort normal.  Abdominal: Soft.  Musculoskeletal:       Feet:  Neurological: She is alert and oriented to person, place, and time. She has normal reflexes. She displays normal reflexes. No cranial nerve deficit. She exhibits normal muscle tone. Coordination normal.  Skin: Skin is warm and dry.  Psychiatric: She has a normal mood and affect. Her behavior is normal. Judgment and thought content normal.    X-rays were done of the right foot and ankle, reported separately.      Assessment & Plan:   Encounter Diagnoses  Name Primary?  . Right foot injury, initial encounter Yes  . Strain of right ankle, initial encounter    I have told her I do not see a fracture on the X-rays.  I have given CAM walker.  I have given instructions for Contrast Baths.  I will see her in two weeks.  She is to remain out of work.  I have reviewed the [place] Controlled Substance Reporting System web site prior to prescribing narcotic medicine for this patient.  Call if any problem.  Precautions discussed.   Electronically Signed Darreld McleanWayne Alaiah Lundy, MD 8/15/20193:27 PM

## 2018-06-29 ENCOUNTER — Ambulatory Visit (INDEPENDENT_AMBULATORY_CARE_PROVIDER_SITE_OTHER): Payer: BLUE CROSS/BLUE SHIELD | Admitting: Orthopaedic Surgery

## 2018-06-29 ENCOUNTER — Encounter: Payer: Self-pay | Admitting: Orthopaedic Surgery

## 2018-06-29 VITALS — BP 123/75 | HR 87 | Ht 62.0 in | Wt 215.0 lb

## 2018-06-29 DIAGNOSIS — M25371 Other instability, right ankle: Secondary | ICD-10-CM

## 2018-06-29 DIAGNOSIS — Z6841 Body Mass Index (BMI) 40.0 and over, adult: Secondary | ICD-10-CM | POA: Diagnosis not present

## 2018-06-29 DIAGNOSIS — N342 Other urethritis: Secondary | ICD-10-CM | POA: Diagnosis not present

## 2018-06-29 DIAGNOSIS — S99921A Unspecified injury of right foot, initial encounter: Secondary | ICD-10-CM

## 2018-06-29 DIAGNOSIS — F419 Anxiety disorder, unspecified: Secondary | ICD-10-CM | POA: Diagnosis not present

## 2018-06-29 NOTE — Patient Instructions (Addendum)
[facility]: [phone]  OUT OF WORK

## 2018-06-29 NOTE — Progress Notes (Signed)
Patient XB:[id number] L Tomkiewicz, female DOB:24-Oct-1991, 27 y.o. WNU:[id number]  Chief Complaint  Patient presents with  . Ankle Injury    Right ankle injury.    HPI  Cathy Wells is a 27 y.o. female who has continued pain of the right foot and ankle. She has been using the CAM walker and it helps. She has no new trauma.  I will set up PT for her and have her gradually wean herself out of the CAM walker.   Body mass index is 39.32 kg/m.  ROS  Review of Systems  Constitutional: Positive for activity change.  Respiratory: Positive for shortness of breath. Negative for cough.   Cardiovascular: Negative for chest pain and leg swelling.  Musculoskeletal: Positive for arthralgias, gait problem and joint swelling.  All other systems reviewed and are negative.   All other systems reviewed and are negative.  The following is a summary of the past history medically, past history surgically, known current medicines, social history and family history.  This information is gathered electronically by the computer from prior information and documentation.  I review this each visit and have found including this information at this point in the chart is beneficial and informative.    Past Medical History:  Diagnosis Date  . Complication of anesthesia    PONV  . Frequency of urination   . GERD (gastroesophageal reflux disease)   . Hematuria   . Left ureteral [place]   . Mild asthma   . Ovarian cyst   . Urgency of urination   . Wears contact lenses     Past Surgical History:  Procedure Laterality Date  . APPENDECTOMY  07/25/2006   OPEN  . URETEROSCOPY WITH HOLMIUM LASER LITHOTRIPSY Left 07/21/2017   Procedure: retrogrades, URETEROSCOPY WITH [place] basketry, stent placement;  Surgeon: Crist Fat, MD;  Location: [facility];  Service: Urology;  Laterality: Left;    Family History  Problem Relation Age of Onset  . Hypertension Mother   . Asthma Father   .  Hypertension Father   . Hypertension Maternal Grandfather   . Diabetes Paternal Grandmother   . Hypertension Paternal Grandmother   . COPD Paternal Grandfather     Social History Social History   Tobacco Use  . Smoking status: Never Smoker  . Smokeless tobacco: Never Used  Substance Use Topics  . Alcohol use: No  . Drug use: No    Allergies  Allergen Reactions  . Doxycycline Nausea And Vomiting  . Amoxapine And Related Hives and Nausea And Vomiting  . Azithromycin Other (See Comments)    "MAKES ME SICK"  . Clarithromycin Nausea And Vomiting  . Erythromycin Other (See Comments)    "SICK ON STOMACH"  . Morphine And Related Nausea And Vomiting  . Penicillins Hives    .Has patient had a PCN reaction causing immediate rash, facial/tongue/throat swelling, SOB or lightheadedness with hypotension: Unknown Has patient had a PCN reaction causing severe rash involving mucus membranes or skin necrosis: Unknown Has patient had a PCN reaction that required hospitalization: Unknown Has patient had a PCN reaction occurring within the last 10 years: No If all of the above answers are "NO", then may proceed with Cephalosporin use.   . Sulfa Antibiotics Hives    Current Outpatient Medications  Medication Sig Dispense Refill  . acetaminophen (TYLENOL) 500 MG tablet Take 500 mg by mouth as needed.    Marland Kitchen albuterol (PROVENTIL HFA;VENTOLIN HFA) 108 (90 Base) MCG/ACT inhaler Inhale 1-2 puffs into  the lungs every 6 (six) hours as needed for wheezing or shortness of breath.    . escitalopram (LEXAPRO) 20 MG tablet     . HYDROcodone-acetaminophen (NORCO/VICODIN) 5-325 MG tablet One tablet every four hours as needed for acute pain.  Limit of five days per [place] statue. 30 tablet 0  . pantoprazole (PROTONIX) 40 MG tablet Take 40 mg by mouth daily as needed.      No current facility-administered medications for this visit.      Physical Exam  Blood pressure 123/75, pulse 87, height 5\' 2"   (1.575 m), weight 215 lb (97.5 kg), last menstrual period 06/07/2018.  Constitutional: overall normal hygiene, normal nutrition, well developed, normal grooming, normal body habitus. Assistive device:CAM waler  Musculoskeletal: gait and station Limp right, muscle tone and strength are normal, no tremors or atrophy is present.  .  Neurological: coordination overall normal.  Deep tendon reflex/nerve stretch intact.  Sensation normal.  Cranial nerves II-XII intact.   Skin:   Normal overall no scars, lesions, ulcers or rashes. No psoriasis.  Psychiatric: Alert and oriented x 3.  Recent memory intact, remote memory unclear.  Normal mood and affect. Well groomed.  Good eye contact.  Cardiovascular: overall no swelling, no varicosities, no edema bilaterally, normal temperatures of the legs and arms, no clubbing, cyanosis and good capillary refill.  Lymphatic: palpation is normal.  Right ankle with tenderness laterally and medial foot tender over navicular area.  She has slight lateral swelling.  NV intact. ROM full but tender. Limp to the right.  All other systems reviewed and are negative   The patient has been educated about the nature of the problem(s) and counseled on treatment options.  The patient appeared to understand what I have discussed and is in agreement with it.  Encounter Diagnoses  Name Primary?  . Right foot injury, initial encounter Yes  . Ankle instability, right     PLAN Call if any problems.  Precautions discussed.  Continue current medications.   Return to clinic 2 weeks  Stay out of work.   Begin PT.  Electronically Signed Darreld McleanWayne Izaac Reisig, MD 8/29/20199:54 AM

## 2018-07-06 ENCOUNTER — Encounter: Payer: Self-pay | Admitting: Orthopaedic Surgery

## 2018-07-11 ENCOUNTER — Telehealth (HOSPITAL_COMMUNITY): Payer: Self-pay | Admitting: Internal Medicine

## 2018-07-11 ENCOUNTER — Ambulatory Visit (HOSPITAL_COMMUNITY): Payer: BLUE CROSS/BLUE SHIELD | Attending: Orthopaedic Surgery

## 2018-07-11 ENCOUNTER — Encounter (HOSPITAL_COMMUNITY): Payer: Self-pay

## 2018-07-11 ENCOUNTER — Other Ambulatory Visit: Payer: Self-pay

## 2018-07-11 DIAGNOSIS — M25572 Pain in left ankle and joints of left foot: Secondary | ICD-10-CM | POA: Diagnosis not present

## 2018-07-11 DIAGNOSIS — R600 Localized edema: Secondary | ICD-10-CM | POA: Insufficient documentation

## 2018-07-11 DIAGNOSIS — R262 Difficulty in walking, not elsewhere classified: Secondary | ICD-10-CM | POA: Insufficient documentation

## 2018-07-11 DIAGNOSIS — R2689 Other abnormalities of gait and mobility: Secondary | ICD-10-CM | POA: Insufficient documentation

## 2018-07-11 DIAGNOSIS — M6281 Muscle weakness (generalized): Secondary | ICD-10-CM | POA: Insufficient documentation

## 2018-07-11 NOTE — Telephone Encounter (Signed)
9/10  11:37 left a message asking patient to come in at 1:45 instead of 1:00 since we have a staff meeting.

## 2018-07-12 ENCOUNTER — Ambulatory Visit: Payer: BLUE CROSS/BLUE SHIELD | Admitting: Orthopaedic Surgery

## 2018-07-12 ENCOUNTER — Encounter: Payer: Self-pay | Admitting: Orthopaedic Surgery

## 2018-07-12 VITALS — BP 135/97 | HR 87 | Ht 62.0 in | Wt 221.0 lb

## 2018-07-12 DIAGNOSIS — M25571 Pain in right ankle and joints of right foot: Secondary | ICD-10-CM

## 2018-07-12 DIAGNOSIS — Z6841 Body Mass Index (BMI) 40.0 and over, adult: Secondary | ICD-10-CM

## 2018-07-12 DIAGNOSIS — M25371 Other instability, right ankle: Secondary | ICD-10-CM | POA: Diagnosis not present

## 2018-07-12 NOTE — Therapy (Addendum)
[facility] [address] [place], [place], [address] Phone: [phone]   Fax:  [phone]  Physical Therapy Evaluation  Patient Details  Name: Cathy Wells MRN: [id number] Date of Birth: Aug 03, 1991 Referring Provider: Darreld Mclean, MD   Encounter Date: 07/11/2018     07/12/18 0854  PT Visits / Re-Eval  Visit Number 1  Number of Visits 10  Date for PT Re-Evaluation 08/09/18 (mini-re-assess 07/26/2018)  Authorization  Authorization Type [employer] - no co-pay, co-insurance 20%; 60 visits comined PT OT SLP; 1 visit per day  Authorization - Visit Number 1  Authorization - Number of Visits 10  PT Time Calculation  PT Start Time 1345  PT Stop Time 1430  PT Time Calculation (min) 45 min  PT - End of Session  Activity Tolerance Patient tolerated treatment well  Behavior During Therapy [facility] for tasks assessed/performed    Past Medical History:  Diagnosis Date  . Complication of anesthesia    PONV  . Frequency of urination   . GERD (gastroesophageal reflux disease)   . Hematuria   . Left ureteral [place]   . Mild asthma   . Ovarian cyst   . Urgency of urination   . Wears contact lenses     Past Surgical History:  Procedure Laterality Date  . APPENDECTOMY  07/25/2006   OPEN  . URETEROSCOPY WITH HOLMIUM LASER LITHOTRIPSY Left 07/21/2017   Procedure: retrogrades, URETEROSCOPY WITH [place] basketry, stent placement;  Surgeon: Crist Fat, MD;  Location: [facility]  Service: Urology;  Laterality: Left;    There were no vitals filed for this visit.      07/11/18 0001  Assessment  Medical Diagnosis R ankle sprain  Referring Provider Darreld Mclean, MD  Onset Date/Surgical Date 06/11/18  Next MD Visit 07/13/2018  Prior Therapy none  Balance Screen  Has the patient fallen in the past 6 months Yes  How many times? 2  Has the patient had a decrease in activity level because of a fear of falling?  Yes  Is  the patient reluctant to leave their home because of a fear of falling?  Yes  Prior Function  Level of Independence Independent;Independent with basic ADLs  Vocation Full time employment  Vocation Requirements lots of walking, lots of heavy pushing and lifting; shifts are 8 hours  Observation/Other Assessments  Observations none noted  Focus on Therapeutic Outcomes (FOTO)  60% limited  AROM  Right Ankle Dorsiflexion 0  Right Ankle Plantar Flexion 34  Right Ankle Inversion 40  Right Ankle Eversion 14  Left Ankle Dorsiflexion 5  Left Ankle Plantar Flexion 25  Left Ankle Inversion 41  Left Ankle Eversion 26  Strength  Left Knee Flexion 5/5  Left Knee Extension 5/5  Right Knee Flexion 4+/5  Right Knee Extension 4+/5  Right Ankle Dorsiflexion 4/5  Right Ankle Inversion 4-/5 (pain limited medial ankle)  Left Ankle Dorsiflexion 5/5  Left Ankle Inversion 4+/5  Left Ankle Eversion 4+/5  Right Ankle Eversion 4/5 (pain limited medial ankle)  Ambulation/Gait  Ambulation Distance (Feet) 346 Feet  Assistive device None  Gait Pattern Step-through pattern;Decreased stance time - right;Decreased step length - left;Decreased stride length;Decreased weight shift to right;Decreased dorsiflexion - right;Antalgic  Ambulation Surface Level  Gait velocity 0.96 m.s  Gait Comments patient commented right ankle fatigue and tightening at end of 2 minutes  Balance  Balance Assessed Yes  Static Standing Balance  Static Standing - Balance Support No upper  extremity supported  Static Standing - Level of Assistance 5: Stand by assistance  Static Standing - Comment/# of Minutes left 30 sec; right 6 sec hold limited by pain  Standardized Balance Assessment  Standardized Balance Assessment Dynamic Gait Index  Dynamic Gait Index  Level Surface 3  Change in Gait Speed 3  Gait with Horizontal Head Turns 3  Gait with Vertical Head Turns 2  Gait and Pivot Turn 3  Step Over Obstacle 2  Step Around  Obstacles 2  Steps 2  Total Score 20  DGI comment: gait quality vs balance                 Objective measurements completed on examination: See above findings.                PT Short Term Goals - 07/12/18 1316      PT SHORT TERM GOAL #1   Title  Patient will be independent in initial HEP and perform on a regular basis.     Time  2    Period  Weeks    Status  New      PT SHORT TERM GOAL #2   Title  Patient will be able to stand for 15 minutes without increased complaints of pain in right foot/ankle    Baseline  inital - 5 minutes    Time  2    Period  Weeks    Status  New      PT SHORT TERM GOAL #3   Title  Patient will be able to walk for 15 minutes to improve her functional ability and return to PLOF at home and at work.     Baseline  inital - 5 minutes    Time  4    Period  Weeks    Status  New      PT SHORT TERM GOAL #4   Title  Patient will grade 4+/5 of greater in muscle groups of the Rt LE to indicate improved functional strength and balance for activities.   (Pended)     Time  2  (Pended)     Period  Weeks  (Pended)     Status  New  (Pended)         PT Long Term Goals - 07/12/18 1316      PT LONG TERM GOAL #1   Title  Patient will be independent in initial HEP and perform on a regular basis.     Time  4    Period  Weeks    Status  New    Target Date  08/09/18      PT LONG TERM GOAL #2   Title  Patient will be able to stand for 15 minutes without increased complaints of pain in right foot/ankle    Baseline  inital - 5 minutes    Time  4    Period  Weeks    Status  New      PT LONG TERM GOAL #3   Title  Patient to be able to maintain right SLS on foam for at least 30 seconds bilaterally to demonstrate improved ankle stability and reduced risk of re-injury     Baseline  initial - 6 seconds right    Time  4    Period  Weeks    Status  New      PT LONG TERM GOAL #4   Title  Patient will be able to walk for 30 minutes to  improve her functional ability and return to PLOF at home and at work.     Baseline  inital - 5 minutes    Time  4    Period  Weeks    Status  New             Plan - 07/19/18 4098    Clinical Impression Statement  Patient presents to clinic after a "bad" right ankle sprain referred by Dr Hilda Lias. Patient just recently began walking out of the CAM boot. Today was unable to find her athletics shoes and presents to clinic in flip flops. Patient is unable to stand or walk for long distances due to pain. She is currently out of work secondary to the right ankle sprain and decreased function. Patient exhibits decreased strength, decreased functional ability, decreased AROM, impaired balance and increased pain. Patient would benefit from skilled OPPT to address the aforementioned deficits.     History and Personal Factors relevant to plan of care:  anxiety, depression, s/p appendectomy    Clinical Presentation  Stable    Clinical Presentation due to:  DGI, MMT, AROM, SLS, FOTO, clinical judgement    Clinical Decision Making  Low    Rehab Potential  Good    PT Frequency  2x / week    PT Duration  4 weeks    PT Treatment/Interventions  ADLs/Self Care Home Management;Therapeutic activities;Therapeutic exercise;Balance training;Neuromuscular re-education;Cognitive remediation;Gait training;Stair training;Patient/family education;Orthotic Fit/Training;Manual techniques;Passive range of motion;Joint Manipulations;Dry needling;Energy conservation;Taping    PT Next Visit Plan  review goals/eval, initiate HEP, initiate seated theraband exercises, towel sweeps/slides, possibly BAPS and balance training.    Consulted and Agree with Plan of Care  Patient       Patient will benefit from skilled therapeutic intervention in order to improve the following deficits and impairments:  Abnormal gait, Decreased activity tolerance, Decreased strength, Pain, Decreased balance, Decreased mobility, Difficulty walking,  Decreased range of motion  Visit Diagnosis: Pain in left ankle and joints of left foot - Plan: PT plan of care cert/re-cert  Muscle weakness (generalized) - Plan: PT plan of care cert/re-cert  Other abnormalities of gait and mobility - Plan: PT plan of care cert/re-cert     Problem List Patient Active Problem List   Diagnosis Date Noted  . Irregular periods 12/14/2016  . RLQ abdominal pain 11/30/2016  . Missed periods 11/30/2016  . Right ovarian cyst 11/30/2016  . Urinary frequency 05/27/2015  . Pelvic pain in female 05/27/2015  . UTI (lower urinary tract infection) 04/28/2015   Katina Dung. Hartnett-Rands, MS, PT Per [facility] #[id number] 07/19/2018, 8:30 AM  [facility] [address] [place], [place], [address] Phone: [phone]   Fax:  [phone]  Name: Cathy Wells MRN: [id number] Date of Birth: [birth year]

## 2018-07-12 NOTE — Progress Notes (Signed)
Patient Cathy Wells, female DOB:09-May-1991, 27 y.o. MGQ:[id number]  Chief Complaint  Patient presents with  . Ankle Pain    right    HPI  Cathy Wells is a 27 y.o. female who has right ankle pain.  She has had one PT evaluation and is to start PT tomorrow.  She still has medial ankle pain and foot pain with some swelling.  She has no redness.  She has been out of work.  They have no light duty work.  She has no new trauma.  Her pain is a little better but only a little.   Body mass index is 40.42 kg/m.  ROS  Review of Systems  Constitutional: Positive for activity change.  Respiratory: Positive for shortness of breath. Negative for cough.   Cardiovascular: Negative for chest pain and leg swelling.  Musculoskeletal: Positive for arthralgias, gait problem and joint swelling.  All other systems reviewed and are negative.   All other systems reviewed and are negative.  The patient meets the AMA guidelines for Morbid (severe) obesity with a BMI > 40.0 and I have recommended weight loss.  The following is a summary of the past history medically, past history surgically, known current medicines, social history and family history.  This information is gathered electronically by the computer from prior information and documentation.  I review this each visit and have found including this information at this point in the chart is beneficial and informative.    Past Medical History:  Diagnosis Date  . Complication of anesthesia    PONV  . Frequency of urination   . GERD (gastroesophageal reflux disease)   . Hematuria   . Left ureteral [place]   . Mild asthma   . Ovarian cyst   . Urgency of urination   . Wears contact lenses     Past Surgical History:  Procedure Laterality Date  . APPENDECTOMY  07/25/2006   OPEN  . URETEROSCOPY WITH HOLMIUM LASER LITHOTRIPSY Left 07/21/2017   Procedure: retrogrades, URETEROSCOPY WITH [place] basketry, stent placement;  Surgeon: Crist Fat, MD;  Location: [facility];  Service: Urology;  Laterality: Left;    Family History  Problem Relation Age of Onset  . Hypertension Mother   . Asthma Father   . Hypertension Father   . Hypertension Maternal Grandfather   . Diabetes Paternal Grandmother   . Hypertension Paternal Grandmother   . COPD Paternal Grandfather     Social History Social History   Tobacco Use  . Smoking status: Never Smoker  . Smokeless tobacco: Never Used  Substance Use Topics  . Alcohol use: No  . Drug use: No    Allergies  Allergen Reactions  . Doxycycline Nausea And Vomiting  . Amoxapine And Related Hives and Nausea And Vomiting  . Azithromycin Other (See Comments)    "MAKES ME SICK"  . Clarithromycin Nausea And Vomiting  . Erythromycin Other (See Comments)    "SICK ON STOMACH"  . Morphine And Related Nausea And Vomiting  . Penicillins Hives    .Has patient had a PCN reaction causing immediate rash, facial/tongue/throat swelling, SOB or lightheadedness with hypotension: Unknown Has patient had a PCN reaction causing severe rash involving mucus membranes or skin necrosis: Unknown Has patient had a PCN reaction that required hospitalization: Unknown Has patient had a PCN reaction occurring within the last 10 years: No If all of the above answers are "NO", then may proceed with Cephalosporin use.   . Sulfa Antibiotics  Hives    Current Outpatient Medications  Medication Sig Dispense Refill  . acetaminophen (TYLENOL) 500 MG tablet Take 500 mg by mouth as needed.    Marland Kitchen albuterol (PROVENTIL HFA;VENTOLIN HFA) 108 (90 Base) MCG/ACT inhaler Inhale 1-2 puffs into the lungs every 6 (six) hours as needed for wheezing or shortness of breath.    . escitalopram (LEXAPRO) 20 MG tablet     . HYDROcodone-acetaminophen (NORCO/VICODIN) 5-325 MG tablet One tablet every four hours as needed for acute pain.  Limit of five days per [place] statue. 30 tablet 0  . pantoprazole (PROTONIX)  40 MG tablet Take 40 mg by mouth daily as needed.      No current facility-administered medications for this visit.      Physical Exam  Blood pressure (!) 135/97, pulse 87, height 5\' 2"  (1.575 m), weight 221 lb (100.2 kg).  Constitutional: overall normal hygiene, normal nutrition, well developed, normal grooming, normal body habitus. Assistive device:none  Musculoskeletal: gait and station Limp right, muscle tone and strength are normal, no tremors or atrophy is present.  .  Neurological: coordination overall normal.  Deep tendon reflex/nerve stretch intact.  Sensation normal.  Cranial nerves II-XII intact.   Skin:   Normal overall no scars, lesions, ulcers or rashes. No psoriasis.  Psychiatric: Alert and oriented x 3.  Recent memory intact, remote memory unclear.  Normal mood and affect. Well groomed.  Good eye contact.  Cardiovascular: overall no swelling, no varicosities, no edema bilaterally, normal temperatures of the legs and arms, no clubbing, cyanosis and good capillary refill.  Lymphatic: palpation is normal.  All other systems reviewed and are negative   She has right medial ankle pain with no swelling or redness  ROM is full.  She has pain over the medial navicular.  NV intact. She has a limp to the right.  The patient has been educated about the nature of the problem(s) and counseled on treatment options.  The patient appeared to understand what I have discussed and is in agreement with it.  Encounter Diagnoses  Name Primary?  Marland Kitchen Ankle instability, right Yes  . Acute right ankle pain     PLAN Call if any problems.  Precautions discussed.  Continue current medications.  Stay out of work. Return to clinic 2 weeks   PT  Electronically Signed Darreld Mclean, MD 9/11/20192:34 PM

## 2018-07-12 NOTE — Patient Instructions (Signed)
Out of work 

## 2018-07-13 ENCOUNTER — Ambulatory Visit: Payer: BLUE CROSS/BLUE SHIELD | Admitting: Orthopaedic Surgery

## 2018-07-18 ENCOUNTER — Ambulatory Visit (HOSPITAL_COMMUNITY): Payer: BLUE CROSS/BLUE SHIELD | Admitting: Physical Therapy

## 2018-07-18 ENCOUNTER — Telehealth (HOSPITAL_COMMUNITY): Payer: Self-pay | Admitting: Physical Therapy

## 2018-07-18 NOTE — Telephone Encounter (Signed)
Called pt re missed appointment.  Her phone had calling restrictions which would not allow me to place the call.  [name], PT CLT [phone]

## 2018-07-19 ENCOUNTER — Ambulatory Visit (HOSPITAL_COMMUNITY): Payer: BLUE CROSS/BLUE SHIELD

## 2018-07-19 ENCOUNTER — Telehealth (HOSPITAL_COMMUNITY): Payer: Self-pay

## 2018-07-19 DIAGNOSIS — M25572 Pain in left ankle and joints of left foot: Secondary | ICD-10-CM

## 2018-07-19 DIAGNOSIS — R2689 Other abnormalities of gait and mobility: Secondary | ICD-10-CM

## 2018-07-19 DIAGNOSIS — M6281 Muscle weakness (generalized): Secondary | ICD-10-CM

## 2018-07-19 NOTE — Telephone Encounter (Signed)
No show #2, attempted to call though phone has call restrictions making it unable to get through.  Attempted to call mother as well, no answer and did not leave message as was not the primary pt.  [address]Casey Suleman Wells, LPTA; CBIS [phone]

## 2018-07-19 NOTE — Therapy (Signed)
Pt arrived late, discussed limited treatment time available and reviewed schedule for following apts.  Also discussed phone number, pt stated she changed number and planned to change at front desk.  No treatment complete this session.    [address] StreetCasey Cockerham, LPTA; CBIS [phone]

## 2018-07-20 ENCOUNTER — Ambulatory Visit (HOSPITAL_COMMUNITY): Payer: BLUE CROSS/BLUE SHIELD

## 2018-07-20 ENCOUNTER — Encounter (HOSPITAL_COMMUNITY): Payer: Self-pay

## 2018-07-20 DIAGNOSIS — R2689 Other abnormalities of gait and mobility: Secondary | ICD-10-CM

## 2018-07-20 DIAGNOSIS — M25572 Pain in left ankle and joints of left foot: Secondary | ICD-10-CM

## 2018-07-20 DIAGNOSIS — R262 Difficulty in walking, not elsewhere classified: Secondary | ICD-10-CM | POA: Diagnosis not present

## 2018-07-20 DIAGNOSIS — M6281 Muscle weakness (generalized): Secondary | ICD-10-CM

## 2018-07-20 DIAGNOSIS — R600 Localized edema: Secondary | ICD-10-CM | POA: Diagnosis not present

## 2018-07-20 NOTE — Therapy (Signed)
[facility] [address] [place], [place], [address] Phone: [phone]   Fax:  [phone]  Physical Therapy Treatment  Patient Details  Name: Cathy Wells MRN: [id number] Date of Birth: 1991/05/31 Referring Provider: Darreld McleanWayne Keeling, MD   Encounter Date: 07/20/2018  PT End of Session - 07/20/18 1318    Visit Number  2    Number of Visits  10    Date for PT Re-Evaluation  08/09/18   Mini-reassess 07/26/18   Authorization Type  BCBS - no co-pay, co-insurance 20%; 60 visits comined PT OT SLP; 1 visit per day    Authorization Time Period  Cert: 6/96-->9/10--> 08/09/18    Authorization - Visit Number  2    Authorization - Number of Visits  10    PT Start Time  1306    PT Stop Time  1344    PT Time Calculation (min)  38 min    Activity Tolerance  Patient tolerated treatment well    Behavior During Therapy  [facility] for tasks assessed/performed       Past Medical History:  Diagnosis Date  . Complication of anesthesia    PONV  . Frequency of urination   . GERD (gastroesophageal reflux disease)   . Hematuria   . Left ureteral [place]   . Mild asthma   . Ovarian cyst   . Urgency of urination   . Wears contact lenses     Past Surgical History:  Procedure Laterality Date  . APPENDECTOMY  07/25/2006   OPEN  . URETEROSCOPY WITH HOLMIUM LASER LITHOTRIPSY Left 07/21/2017   Procedure: retrogrades, URETEROSCOPY WITH Walkup basketry, stent placement;  Surgeon: Crist FatHerrick, Benjamin W, MD;  [facility] [place];  Service: Urology;  Laterality: Left;    There were no vitals filed for this visit.  Subjective Assessment - 07/20/18 1310    Subjective  Pt stated she has pain with different pair of tennis shoes today, current pain scale intermittent 3/10 Rt ankle.  Increased difficulty with steps and standing/walking for long periods of time on hard surfaces.    Pertinent History  allergies, asthma, anxiety, depression, kidney stones`     Patient Stated Goals  get back to PLOF, strengthen ankle     Currently in Pain?  Yes    Pain Score  3     Pain Location  Ankle    Pain Orientation  Left    Pain Descriptors / Indicators  Sore;Aching    Pain Type  Chronic pain    Pain Onset  More than a month ago    Pain Frequency  Intermittent    Aggravating Factors   weight bearing increases pain on medial aspect of Rt ankle bone    Pain Relieving Factors  non-weight bearing, ice/elevation, alternating hot/ice, CAM boot helped     Effect of Pain on Daily Activities  not working right now becuase of Rt ankle pain                       [facility] Adult PT Treatment/Exercise - 07/20/18 0001      Exercises   Exercises  Ankle      Ankle Exercises: Seated   Towel Crunch  2 reps    Towel Inversion/Eversion  3 reps    Marble Pickup  10x    Heel Raises  15 reps    Toe Raise  15 reps    BAPS  Sitting;Level 2;10 reps  BAPS Limitations  DF/PF; Inv/Ev; CW/CCW      Ankle Exercises: Supine   T-Band  GTB 10x each direction             PT Education - 07/20/18 1328    Education provided  Yes    Education Details  Reviewed goals, established HEP and copy of eval given to pt.  Instructed ice massage techniques to assist with inflammation and pain.  Reviewed proper shoes for support.    Person(s) Educated  Patient    Methods  Explanation;Demonstration;Handout    Comprehension  Verbalized understanding;Returned demonstration       PT Short Term Goals - 07/12/18 1316      PT SHORT TERM GOAL #1   Title  Patient will be independent in initial HEP and perform on a regular basis.     Time  2    Period  Weeks    Status  New      PT SHORT TERM GOAL #2   Title  Patient will be able to stand for 15 minutes without increased complaints of pain in right foot/ankle    Baseline  inital - 5 minutes    Time  2    Period  Weeks    Status  New      PT SHORT TERM GOAL #3   Title  Patient will be able to walk for 15 minutes to  improve her functional ability and return to PLOF at home and at work.     Baseline  inital - 5 minutes    Time  4    Period  Weeks    Status  New      PT SHORT TERM GOAL #4   Title  Patient will grade 4+/5 of greater in muscle groups of the Rt LE to indicate improved functional strength and balance for activities.   (Pended)     Time  2  (Pended)     Period  Weeks  (Pended)     Status  New  (Pended)         PT Long Term Goals - 07/12/18 1316      PT LONG TERM GOAL #1   Title  Patient will be independent in initial HEP and perform on a regular basis.     Time  4    Period  Weeks    Status  New    Target Date  08/09/18      PT LONG TERM GOAL #2   Title  Patient will be able to stand for 15 minutes without increased complaints of pain in right foot/ankle    Baseline  inital - 5 minutes    Time  4    Period  Weeks    Status  New      PT LONG TERM GOAL #3   Title  Patient to be able to maintain right SLS on foam for at least 30 seconds bilaterally to demonstrate improved ankle stability and reduced risk of re-injury     Baseline  initial - 6 seconds right    Time  4    Period  Weeks    Status  New      PT LONG TERM GOAL #4   Title  Patient will be able to walk for 30 minutes to improve her functional ability and return to PLOF at home and at work.     Baseline  inital - 5 minutes    Time  4  Period  Weeks    Status  New            Plan - 07/20/18 1330    Clinical Impression Statement  Reviewed goals and copy of eval given to pt.  Session focus wiht ankle and intristic strengthening with exercises in seated position.  Pt able to complete all exericses with good form following cueing to address any compensation with knee movements with BAPS activity.  Established HEP, pt able to demonstrate and verbalize proper form with all exercises.  No reports of increased pain through session, was limited by muscle fatigue with activities.      Rehab Potential  Good    PT  Frequency  2x / week    PT Duration  4 weeks    PT Treatment/Interventions  ADLs/Self Care Home Management;Therapeutic activities;Therapeutic exercise;Balance training;Neuromuscular re-education;Cognitive remediation;Gait training;Stair training;Patient/family education;Orthotic Fit/Training;Manual techniques;Passive range of motion;Joint Manipulations;Dry needling;Energy conservation;Taping    PT Next Visit Plan  Review compliance with HEP.  Continue with seated theraband exercises and give as HEP if good from.  Continue with towel sweeps/slides, increase to L3 with BAPS and balance training.  Progress to standing heel/toe raises as able.  Add tandem stance next session.      PT Home Exercise Plan  07/20/18:  heel/toe raises and ankle circles       Patient will benefit from skilled therapeutic intervention in order to improve the following deficits and impairments:  Abnormal gait, Decreased activity tolerance, Decreased strength, Pain, Decreased balance, Decreased mobility, Difficulty walking, Decreased range of motion  Visit Diagnosis: Other abnormalities of gait and mobility  Muscle weakness (generalized)  Pain in left ankle and joints of left foot     Problem List Patient Active Problem List   Diagnosis Date Noted  . Irregular periods 12/14/2016  . RLQ abdominal pain 11/30/2016  . Missed periods 11/30/2016  . Right ovarian cyst 11/30/2016  . Urinary frequency 05/27/2015  . Pelvic pain in female 05/27/2015  . UTI (lower urinary tract infection) 04/28/2015   Becky Sax, LPTA; CBIS [phone]  Juel Burrow 07/20/2018, 2:55 PM  [facility] [address] [place], [place], [address] Phone: [phone]   Fax:  [phone]  Name: Cathy Wells MRN: [id number] Date of Birth: Aug 01, 1991

## 2018-07-20 NOTE — Patient Instructions (Signed)
Toe / Heel Raise (Sitting)    Sitting, raise heels, then rock back on heels and raise toes. Repeat 15 times.  Copyright  VHI. All rights reserved.   Toe / Heel Raise (Standing)    Standing with support, raise heels, then rock back on heels and raise toes. Repeat 10-15 times.  Copyright  VHI. All rights reserved.   Ankle Circles    Slowly rotate right foot and ankle clockwise then counterclockwise. Gradually increase range of motion. Avoid pain. Circle 10 times each direction per set. Do 2 sets per session.   http://orth.exer.us/31   Copyright  VHI. All rights reserved.   Single Leg Balance: Eyes Open    Stand on right leg with eyes open. Hold 30 seconds. 3 reps 2 times per day.  http://ggbe.exer.us/5   Copyright  VHI. All rights reserved.

## 2018-07-25 ENCOUNTER — Encounter (HOSPITAL_COMMUNITY): Payer: Self-pay

## 2018-07-25 ENCOUNTER — Ambulatory Visit (HOSPITAL_COMMUNITY): Payer: BLUE CROSS/BLUE SHIELD

## 2018-07-25 DIAGNOSIS — M6281 Muscle weakness (generalized): Secondary | ICD-10-CM | POA: Diagnosis not present

## 2018-07-25 DIAGNOSIS — R2689 Other abnormalities of gait and mobility: Secondary | ICD-10-CM | POA: Diagnosis not present

## 2018-07-25 DIAGNOSIS — R262 Difficulty in walking, not elsewhere classified: Secondary | ICD-10-CM | POA: Diagnosis not present

## 2018-07-25 DIAGNOSIS — R600 Localized edema: Secondary | ICD-10-CM

## 2018-07-25 DIAGNOSIS — M25572 Pain in left ankle and joints of left foot: Secondary | ICD-10-CM

## 2018-07-25 NOTE — Patient Instructions (Signed)
Heel Raise: Bilateral (Standing)    Rise on balls of feet. Repeat ____ times per set. Do ____ sets per session. Do ____ sessions per day.  http://orth.exer.us/39   Copyright  VHI. All rights reserved.   Balance: Eyes Open - Unilateral (Varied Surfaces)    Stand on left foot, eyes open. Maintain balance _30-60___ seconds. Repeat 2__ times per set. Do _1___ sets per session. Repeat on compliant surface: foam/cushion. Can make it more difficult by closing your eyes. Limit if painful.  Copyright  VHI. All rights reserved.

## 2018-07-25 NOTE — Therapy (Signed)
[place] [facility] [address] [place], [place], [address] Phone: [phone]   Fax:  [phone]  Physical Therapy Treatment  Patient Details  Name: BEONCA GIBB MRN: [id number] Date of Birth: 03-21-1991 Referring Provider: Darreld Mclean, MD   Encounter Date: 07/25/2018  PT End of Session - 07/25/18 1344    Visit Number  3    Number of Visits  10    Date for PT Re-Evaluation  08/09/18   Mini-reassess 07/26/18   Authorization Type  BCBS - no co-pay, co-insurance 20%; 60 visits comined PT OT SLP; 1 visit per day    Authorization Time Period  Cert: 9/52--> 08/09/18    Authorization - Visit Number  3    Authorization - Number of Visits  10    PT Start Time  1346    PT Stop Time  1428    PT Time Calculation (min)  42 min    Activity Tolerance  Patient tolerated treatment well    Behavior During Therapy  [facility] for tasks assessed/performed       Past Medical History:  Diagnosis Date  . Complication of anesthesia    PONV  . Frequency of urination   . GERD (gastroesophageal reflux disease)   . Hematuria   . Left ureteral [place]   . Mild asthma   . Ovarian cyst   . Urgency of urination   . Wears contact lenses     Past Surgical History:  Procedure Laterality Date  . APPENDECTOMY  07/25/2006   OPEN  . URETEROSCOPY WITH HOLMIUM LASER LITHOTRIPSY Left 07/21/2017   Procedure: retrogrades, URETEROSCOPY WITH [place]ist basketry, stent placement;  Surgeon: Crist Fat, MD;  Location: [facility];  Service: Urology;  Laterality: Left;    There were no vitals filed for this visit.  Subjective Assessment - 07/25/18 1344    Subjective  No pain today. Pain increases when she walk more like to go shopping at [employer]; aches up to a 5/10.    Pertinent History  allergies, asthma, anxiety, depression, kidney stones`    Patient Stated Goals  get back to PLOF, strengthen ankle     Currently in Pain?  No/denies    Pain Onset  More  than a month ago                       [facility] Adult PT Treatment/Exercise - 07/25/18 0001      Manual Therapy   Manual Therapy  Soft tissue mobilization    Manual therapy comments  Completed separate from all other skilled interventions    Soft tissue mobilization  posterior tibialis tendon/muscle, plantar fascia      Ankle Exercises: Standing   BAPS  Standing;Level 2;10 reps   bilateral   Heel Raises  Right;Left;Both;10 reps          Balance Exercises - 07/25/18 1352      Balance Exercises: Standing   SLS  Eyes open;Solid surface;Foam/compliant surface;2 reps;30 secs   bilateral    Rockerboard  Anterior/posterior;UE support   2 minutes         PT Short Term Goals - 07/25/18 1345      PT SHORT TERM GOAL #1   Title  Patient will be independent in initial HEP and perform on a regular basis.     Time  2    Period  Weeks    Status  Achieved      PT SHORT  TERM GOAL #2   Title  Patient will be able to stand for 15 minutes without increased complaints of pain in right foot/ankle    Baseline  inital - 5 minutes    Time  2    Period  Weeks    Status  On-going      PT SHORT TERM GOAL #3   Title  Patient will be able to walk for 15 minutes to improve her functional ability and return to PLOF at home and at work.     Baseline  inital - 5 minutes    Time  4    Period  Weeks    Status  On-going      PT SHORT TERM GOAL #4   Title  Patient will grade 4+/5 of greater in muscle groups of the Rt LE to indicate improved functional strength and balance for activities.     Time  2    Period  Weeks    Status  On-going        PT Long Term Goals - 07/25/18 1345      PT LONG TERM GOAL #1   Title  Patient will be independent in initial HEP and perform on a regular basis.     Time  4    Period  Weeks    Status  On-going      PT LONG TERM GOAL #2   Title  Patient will be able to stand for 30 minutes without increased complaints of pain in right foot/ankle     Baseline  inital - 5 minutes    Time  4    Period  Weeks    Status  New      PT LONG TERM GOAL #3   Title  Patient to be able to maintain right SLS on foam for at least 30 seconds bilaterally to demonstrate improved ankle stability and reduced risk of re-injury     Baseline  initial - 6 seconds right    Time  4    Period  Weeks    Status  On-going      PT LONG TERM GOAL #4   Title  Patient will be able to walk for 30 minutes to improve her functional ability and return to PLOF at home and at work.     Baseline  inital - 5 minutes    Time  4    Period  Weeks    Status  On-going            Plan - 07/25/18 1345    Clinical Impression Statement  Patient presented today without complaints of pain at beginning of session. She reported her HEP was quite easy now and feels her right ankle and walking pattern are doing better. Progressed patient's therapeutic exercises to standing position and balance training to single limb stance. Patient did have complaints of increased pain when attempting SLS on foam on Rt LE after 25 seconds and BAPS board Level 2 clockwise and counterclockwise. May try Level 1 BAPS next session.  Advanced HEP to include SLS and heel raises.  Continue with current plan, progress as able.     Rehab Potential  Good    PT Frequency  2x / week    PT Duration  4 weeks    PT Treatment/Interventions  ADLs/Self Care Home Management;Therapeutic activities;Therapeutic exercise;Balance training;Neuromuscular re-education;Cognitive remediation;Gait training;Stair training;Patient/family education;Orthotic Fit/Training;Manual techniques;Passive range of motion;Joint Manipulations;Dry needling;Energy conservation;Taping    PT Next Visit Plan  Review  compliance with HEP.  Continue with seated theraband exercises and give as HEP if good form; GTB for each direct, except inversion with RTB.  Try level 1 BAPS board standing - monitor pain response.      PT Home Exercise Plan  07/20/18:   heel/toe raises and ankle circles    Consulted and Agree with Plan of Care  Patient       Patient will benefit from skilled therapeutic intervention in order to improve the following deficits and impairments:  Abnormal gait, Decreased activity tolerance, Decreased strength, Pain, Decreased balance, Decreased mobility, Difficulty walking, Decreased range of motion  Visit Diagnosis: Other abnormalities of gait and mobility  Muscle weakness (generalized)  Pain in left ankle and joints of left foot  Localized edema  Difficulty in walking, not elsewhere classified     Problem List Patient Active Problem List   Diagnosis Date Noted  . Irregular periods 12/14/2016  . RLQ abdominal pain 11/30/2016  . Missed periods 11/30/2016  . Right ovarian cyst 11/30/2016  . Urinary frequency 05/27/2015  . Pelvic pain in female 05/27/2015  . UTI (lower urinary tract infection) 04/28/2015    Katina Dung. Hartnett-Rands, MS, PT Per Ladoris Gene [id number] 07/25/2018, 2:45 PM  [facility] [address] [place], [place], [address] Phone: [phone]   Fax:  [phone]  Name: ADELISA SATTERWHITE MRN: [id number] Date of Birth: Sep 19, 1991

## 2018-07-26 ENCOUNTER — Ambulatory Visit: Payer: BLUE CROSS/BLUE SHIELD | Admitting: Orthopaedic Surgery

## 2018-07-26 ENCOUNTER — Encounter: Payer: Self-pay | Admitting: Orthopaedic Surgery

## 2018-07-26 VITALS — BP 140/99 | HR 81 | Ht 63.0 in | Wt 223.0 lb

## 2018-07-26 DIAGNOSIS — M25371 Other instability, right ankle: Secondary | ICD-10-CM

## 2018-07-26 NOTE — Progress Notes (Signed)
Patient GN:FAOZHYQM L Bowdish, female DOB:01/21/1991, 27 y.o. VHQ:[id number]  Chief Complaint  Patient presents with  . Ankle Pain    Right ankle     HPI  Cathy Wells is a 27 y.o. female who has continued right ankle pain and some instability.  She has been to PT and is making good steady progress.  She has more medial pain now.  I have reviewed the PT notes.  I will keep her out of work another two weeks.  She has to stand most of her shift.   Body mass index is 39.5 kg/m.  ROS  Review of Systems  Constitutional: Positive for activity change.  Respiratory: Positive for shortness of breath. Negative for cough.   Cardiovascular: Negative for chest pain and leg swelling.  Musculoskeletal: Positive for arthralgias, gait problem and joint swelling.  All other systems reviewed and are negative.   All other systems reviewed and are negative.  The following is a summary of the past history medically, past history surgically, known current medicines, social history and family history.  This information is gathered electronically by the computer from prior information and documentation.  I review this each visit and have found including this information at this point in the chart is beneficial and informative.    Past Medical History:  Diagnosis Date  . Complication of anesthesia    PONV  . Frequency of urination   . GERD (gastroesophageal reflux disease)   . Hematuria   . Left ureteral [place]   . Mild asthma   . Ovarian cyst   . Urgency of urination   . Wears contact lenses     Past Surgical History:  Procedure Laterality Date  . APPENDECTOMY  07/25/2006   OPEN  . URETEROSCOPY WITH HOLMIUM LASER LITHOTRIPSY Left 07/21/2017   Procedure: retrogrades, URETEROSCOPY WITH [place]imo basketry, stent placement;  Surgeon: Crist Fat, MD;  Location: [facility];  Service: Urology;  Laterality: Left;    Family History  Problem Relation Age of Onset  .  Hypertension Mother   . Asthma Father   . Hypertension Father   . Hypertension Maternal Grandfather   . Diabetes Paternal Grandmother   . Hypertension Paternal Grandmother   . COPD Paternal Grandfather     Social History Social History   Tobacco Use  . Smoking status: Never Smoker  . Smokeless tobacco: Never Used  Substance Use Topics  . Alcohol use: No  . Drug use: No    Allergies  Allergen Reactions  . Doxycycline Nausea And Vomiting  . Amoxapine And Related Hives and Nausea And Vomiting  . Azithromycin Other (See Comments)    "MAKES ME SICK"  . Clarithromycin Nausea And Vomiting  . Erythromycin Other (See Comments)    "SICK ON STOMACH"  . Morphine And Related Nausea And Vomiting  . Penicillins Hives    .Has patient had a PCN reaction causing immediate rash, facial/tongue/throat swelling, SOB or lightheadedness with hypotension: Unknown Has patient had a PCN reaction causing severe rash involving mucus membranes or skin necrosis: Unknown Has patient had a PCN reaction that required hospitalization: Unknown Has patient had a PCN reaction occurring within the last 10 years: No If all of the above answers are "NO", then may proceed with Cephalosporin use.   . Sulfa Antibiotics Hives    Current Outpatient Medications  Medication Sig Dispense Refill  . acetaminophen (TYLENOL) 500 MG tablet Take 500 mg by mouth as needed.    Marland Kitchen albuterol (PROVENTIL  HFA;VENTOLIN HFA) 108 (90 Base) MCG/ACT inhaler Inhale 1-2 puffs into the lungs every 6 (six) hours as needed for wheezing or shortness of breath.    . escitalopram (LEXAPRO) 20 MG tablet     . HYDROcodone-acetaminophen (NORCO/VICODIN) 5-325 MG tablet One tablet every four hours as needed for acute pain.  Limit of five days per [place] statue. 30 tablet 0  . pantoprazole (PROTONIX) 40 MG tablet Take 40 mg by mouth daily as needed.      No current facility-administered medications for this visit.      Physical Exam  Blood  pressure (!) 140/99, pulse 81, height 5\' 3"  (1.6 m), weight 223 lb (101.2 kg).  Constitutional: overall normal hygiene, normal nutrition, well developed, normal grooming, normal body habitus. Assistive device:none  Musculoskeletal: gait and station Limp right, muscle tone and strength are normal, no tremors or atrophy is present.  .  Neurological: coordination overall normal.  Deep tendon reflex/nerve stretch intact.  Sensation normal.  Cranial nerves II-XII intact.   Skin:   Normal overall no scars, lesions, ulcers or rashes. No psoriasis.  Psychiatric: Alert and oriented x 3.  Recent memory intact, remote memory unclear.  Normal mood and affect. Well groomed.  Good eye contact.  Cardiovascular: overall no swelling, no varicosities, no edema bilaterally, normal temperatures of the legs and arms, no clubbing, cyanosis and good capillary refill.  Lymphatic: palpation is normal.  Her right ankle still some lateral swelling and tenderness but more tenderness of the medial side.  ROM is full.  She has a slight limp to the right.  NV is intact. All other systems reviewed and are negative   The patient has been educated about the nature of the problem(s) and counseled on treatment options.  The patient appeared to understand what I have discussed and is in agreement with it.  Encounter Diagnosis  Name Primary?  Marland Kitchen Ankle instability, right Yes    PLAN Call if any problems.  Precautions discussed.  Continue current medications.   Return to clinic 2 weeks   Electronically Signed Darreld Mclean, MD 9/25/20192:17 PM

## 2018-07-26 NOTE — Patient Instructions (Signed)
Off work for another two weeks

## 2018-07-27 ENCOUNTER — Ambulatory Visit (HOSPITAL_COMMUNITY): Payer: BLUE CROSS/BLUE SHIELD

## 2018-07-27 ENCOUNTER — Encounter (HOSPITAL_COMMUNITY): Payer: Self-pay

## 2018-07-27 DIAGNOSIS — R262 Difficulty in walking, not elsewhere classified: Secondary | ICD-10-CM | POA: Diagnosis not present

## 2018-07-27 DIAGNOSIS — M25572 Pain in left ankle and joints of left foot: Secondary | ICD-10-CM

## 2018-07-27 DIAGNOSIS — R600 Localized edema: Secondary | ICD-10-CM | POA: Diagnosis not present

## 2018-07-27 DIAGNOSIS — M6281 Muscle weakness (generalized): Secondary | ICD-10-CM | POA: Diagnosis not present

## 2018-07-27 DIAGNOSIS — R2689 Other abnormalities of gait and mobility: Secondary | ICD-10-CM | POA: Diagnosis not present

## 2018-07-27 NOTE — Therapy (Signed)
[facility] [address] [place], [place], [address] Phone: [phone]   Fax:  [phone]  Physical Therapy Treatment  Patient Details  Name: Cathy Wells MRN: [id number] Date of Birth: 1991-02-17 Referring Provider (PT): Darreld Mclean, MD   Encounter Date: 07/27/2018  PT End of Session - 07/27/18 1259    Visit Number  4    Number of Visits  10    Date for PT Re-Evaluation  08/09/18   Mini-reassess 07/26/18   Authorization Type  BCBS - no co-pay, co-insurance 20%; 60 visits comined PT OT SLP; 1 visit per day    Authorization Time Period  Cert: 8/41--> 08/09/18    Authorization - Visit Number  4    Authorization - Number of Visits  10    PT Start Time  1300    PT Stop Time  1344    PT Time Calculation (min)  44 min    Activity Tolerance  Patient tolerated treatment well    Behavior During Therapy  [facility] for tasks assessed/performed       Past Medical History:  Diagnosis Date  . Complication of anesthesia    PONV  . Frequency of urination   . GERD (gastroesophageal reflux disease)   . Hematuria   . Left ureteral [place]   . Mild asthma   . Ovarian cyst   . Urgency of urination   . Wears contact lenses     Past Surgical History:  Procedure Laterality Date  . APPENDECTOMY  07/25/2006   OPEN  . URETEROSCOPY WITH HOLMIUM LASER LITHOTRIPSY Left 07/21/2017   Procedure: retrogrades, URETEROSCOPY WITH Egge basketry, stent placement;  Surgeon: Crist Fat, MD;  Location: [facility];  Service: Urology;  Laterality: Left;    There were no vitals filed for this visit.  Subjective Assessment - 07/27/18 1258    Subjective  No pain today. Hurts some after last visit; thinks it was from the standing BAPS baord.    Pertinent History  allergies, asthma, anxiety, depression, kidney stones`    Patient Stated Goals  get back to PLOF, strengthen ankle     Currently in Pain?  No/denies    Pain Onset  More than a  month ago                       [facility] Adult PT Treatment/Exercise - 07/27/18 0001      Manual Therapy   Manual Therapy  Soft tissue mobilization    Manual therapy comments  Completed separate from all other skilled interventions    Soft tissue mobilization  posterior tibialis tendon/muscle, plantar fascia, deltoid ligament      Ankle Exercises: Standing   BAPS  Standing;Level 1;10 reps   A/P, M/L, clock/counterclock   Heel Raises  Both;10 reps    Toe Raise  10 reps;2 seconds      Ankle Exercises: Supine   T-Band  inversion RTB; eversion GTB x10 each          Balance Exercises - 07/27/18 1330      Balance Exercises: Standing   SLS  Eyes open;Foam/compliant surface;Intermittent upper extremity support;3 reps;30 secs   bilateral    Rockerboard  Anterior/posterior;Lateral;EO;Intermittent UE support   2 minutes       PT Education - 07/27/18 1320    Education provided  Yes    Education Details  Discussed purpose and technique of interventions throughout session. Added to HEP.  Person(s) Educated  Patient    Methods  Explanation;Demonstration;Handout    Comprehension  Verbalized understanding;Returned demonstration       PT Short Term Goals - 07/25/18 1345      PT SHORT TERM GOAL #1   Title  Patient will be independent in initial HEP and perform on a regular basis.     Time  2    Period  Weeks    Status  Achieved      PT SHORT TERM GOAL #2   Title  Patient will be able to stand for 15 minutes without increased complaints of pain in right foot/ankle    Baseline  inital - 5 minutes    Time  2    Period  Weeks    Status  On-going      PT SHORT TERM GOAL #3   Title  Patient will be able to walk for 15 minutes to improve her functional ability and return to PLOF at home and at work.     Baseline  inital - 5 minutes    Time  4    Period  Weeks    Status  On-going      PT SHORT TERM GOAL #4   Title  Patient will grade 4+/5 of greater in muscle  groups of the Rt LE to indicate improved functional strength and balance for activities.     Time  2    Period  Weeks    Status  On-going        PT Long Term Goals - 07/25/18 1345      PT LONG TERM GOAL #1   Title  Patient will be independent in initial HEP and perform on a regular basis.     Time  4    Period  Weeks    Status  On-going      PT LONG TERM GOAL #2   Title  Patient will be able to stand for 30 minutes without increased complaints of pain in right foot/ankle    Baseline  inital - 5 minutes    Time  4    Period  Weeks    Status  New      PT LONG TERM GOAL #3   Title  Patient to be able to maintain right SLS on foam for at least 30 seconds bilaterally to demonstrate improved ankle stability and reduced risk of re-injury     Baseline  initial - 6 seconds right    Time  4    Period  Weeks    Status  On-going      PT LONG TERM GOAL #4   Title  Patient will be able to walk for 30 minutes to improve her functional ability and return to PLOF at home and at work.     Baseline  inital - 5 minutes    Time  4    Period  Weeks    Status  On-going            Plan - 07/27/18 1259    Clinical Impression Statement  Session focused on right ankle functional strengthening and balance training. Decreased standing BAPS board to level 1 due to complaints of pain during and after last treatment session. Added long sitting right ankle foot inversion with red theraband and eversion with green theraband to HEP. NO pain complaints today, just muscle fatigue. Less tender to palpation with manual therapy over deltoid ligament, posterior tibialis tendon and muscle, as well as  plantar fascia today. Continue with current plan, progressing as able.     Rehab Potential  Good    PT Frequency  2x / week    PT Duration  4 weeks    PT Treatment/Interventions  ADLs/Self Care Home Management;Therapeutic activities;Therapeutic exercise;Balance training;Neuromuscular re-education;Cognitive  remediation;Gait training;Stair training;Patient/family education;Orthotic Fit/Training;Manual techniques;Passive range of motion;Joint Manipulations;Dry needling;Energy conservation;Taping    PT Next Visit Plan  Perform mini-re-assessment. Continue with seated theraband exercises. Monitor pain response to last session. Progress LE strengthening exercises and balance/proprioception eyes open/closed, solid/complaint training as able.      PT Home Exercise Plan  07/20/18:  heel/toe raises and ankle circles; 07/27/2018 - theraband long sitting ankle inversion/eversion.    Consulted and Agree with Plan of Care  Patient       Patient will benefit from skilled therapeutic intervention in order to improve the following deficits and impairments:  Abnormal gait, Decreased activity tolerance, Decreased strength, Pain, Decreased balance, Decreased mobility, Difficulty walking, Decreased range of motion  Visit Diagnosis: Other abnormalities of gait and mobility  Muscle weakness (generalized)  Pain in left ankle and joints of left foot  Localized edema  Difficulty in walking, not elsewhere classified     Problem List Patient Active Problem List   Diagnosis Date Noted  . Irregular periods 12/14/2016  . RLQ abdominal pain 11/30/2016  . Missed periods 11/30/2016  . Right ovarian cyst 11/30/2016  . Urinary frequency 05/27/2015  . Pelvic pain in female 05/27/2015  . UTI (lower urinary tract infection) 04/28/2015    Katina Dung. Hartnett-Rands, MS, PT Per [facility] #[id number] 07/27/2018, 2:53 PM  [facility] [address] [place], [place], [address] Phone: [phone]   Fax:  [phone]  Name: Cathy Wells MRN: [id number] Date of Birth: 08-Aug-1991

## 2018-07-27 NOTE — Patient Instructions (Signed)
Ankle Eversion: Long-Sitting    Loop green tubing around feet just below toes, legs separated as far as tolerated, toes pointed inward. Rotate ankles, pointing toes outward. Repeat 10-15__ times per set. Do 1__ sets per session. Do _1_ sessions per day.  http://tub.exer.us/218   Copyright  VHI. All rights reserved.  Ankle Inversion: Long-Sitting (Single Leg)    Tubing around forefoot, on same side as anchor, rotate ankle, pointing toes inward. Can anchor in door jam or cross legs and anchor around left foot.  Repeat 10-15__ times per set. Repeat with other leg. Do 1__ sets per session. Do _1_ sessions per day. Anchor Height: Ankle (when standing)  http://tub.exer.us/220   Copyright  VHI. All rights reserved.

## 2018-08-01 ENCOUNTER — Ambulatory Visit (HOSPITAL_COMMUNITY): Payer: BLUE CROSS/BLUE SHIELD | Attending: Orthopaedic Surgery

## 2018-08-01 ENCOUNTER — Encounter (HOSPITAL_COMMUNITY): Payer: Self-pay

## 2018-08-01 DIAGNOSIS — R2689 Other abnormalities of gait and mobility: Secondary | ICD-10-CM

## 2018-08-01 DIAGNOSIS — M25572 Pain in left ankle and joints of left foot: Secondary | ICD-10-CM | POA: Insufficient documentation

## 2018-08-01 DIAGNOSIS — M6281 Muscle weakness (generalized): Secondary | ICD-10-CM | POA: Insufficient documentation

## 2018-08-01 NOTE — Therapy (Addendum)
[facility] [address] [place], [place], [address] Phone: [phone]   Fax:  [phone]  Physical Therapy Treatment  Patient Details  Name: FRANCIE KEELING MRN: [id number] Date of Birth: [birth year] Referring Provider (PT): Darreld Mclean, MD   Encounter Date: 08/01/2018  PT End of Session - 08/01/18 1412    Visit Number  5    Number of Visits  10    Date for PT Re-Evaluation  08/09/18   Minireassess completed 10/01   Authorization Type  BCBS - no co-pay, co-insurance 20%; 60 visits comined PT OT SLP; 1 visit per day    Authorization Time Period  Cert: 6/96--> 08/09/18    Authorization - Visit Number  5    Authorization - Number of Visits  10    PT Start Time  1349    PT Stop Time  1432    PT Time Calculation (min)  43 min    Activity Tolerance  Patient tolerated treatment well    Behavior During Therapy  [facility] for tasks assessed/performed       Past Medical History:  Diagnosis Date  . Complication of anesthesia    PONV  . Frequency of urination   . GERD (gastroesophageal reflux disease)   . Hematuria   . Left ureteral Fye   . Mild asthma   . Ovarian cyst   . Urgency of urination   . Wears contact lenses     Past Surgical History:  Procedure Laterality Date  . APPENDECTOMY  07/25/2006   OPEN  . URETEROSCOPY WITH HOLMIUM LASER LITHOTRIPSY Left 07/21/2017   Procedure: retrogrades, URETEROSCOPY WITH Herbert basketry, stent placement;  Surgeon: Crist Fat, MD;  Location: [facility];  Service: Urology;  Laterality: Left;    There were no vitals filed for this visit.  Subjective Assessment - 08/01/18 1354    Subjective  No reports of pain and improved tolerance following last session.      Pertinent History  allergies, asthma, anxiety, depression, kidney stones`    Patient Stated Goals  get back to PLOF, strengthen ankle     Currently in Pain?  No/denies         [facility] PT Assessment - 08/01/18  0001      Assessment   Medical Diagnosis  R ankle sprain    Referring Provider (PT)  Darreld Mclean, MD    Onset Date/Surgical Date  06/11/18    Next MD Visit  08/09/18    Prior Therapy  none      ROM / Strength   AROM / PROM / Strength  Strength      AROM   Right Ankle Dorsiflexion  7   was 0   Right Ankle Plantar Flexion  36   was 34   Right Ankle Inversion  42   was 40   Right Ankle Eversion  18   was 14   Left Ankle Dorsiflexion  8   was 5   Left Ankle Plantar Flexion  39   was 25   Left Ankle Inversion  42   was 41   Left Ankle Eversion  26   was 26     Strength   Strength Assessment Site  Ankle    Right Knee Flexion  4+/5   was 4+   Right Knee Extension  4+/5   was 4+   Left Knee Flexion  5/5    Left Knee Extension  5/5  Right/Left Ankle  Right    Right Ankle Dorsiflexion  4+/5   was 4/5   Right Ankle Plantar Flexion  4-/5   pain with movement   Right Ankle Inversion  4/5   continues to have pain was 4-/5 with pain medial ankle   Right Ankle Eversion  4+/5   no pain was 4/5 with pain medial ankle   Left Ankle Dorsiflexion  5/5    Left Ankle Inversion  4+/5   was 4+/5   Left Ankle Eversion  4+/5   was 4+/5                  [facility] Adult PT Treatment/Exercise - 08/01/18 0001      Manual Therapy   Manual Therapy  Soft tissue mobilization    Manual therapy comments  Completed separate from all other skilled interventions    Soft tissue mobilization  posterior tibialis tendon/muscle, plantar fascia, deltoid ligament      Ankle Exercises: Standing   BAPS  Standing;Level 2;10 reps    SLS  5 attempts    Heel Raises  Both;15 reps;3 seconds    Toe Raise  15 reps;3 seconds          Balance Exercises - 08/01/18 1817      Balance Exercises: Standing   SLS  Eyes open;Foam/compliant surface;Intermittent upper extremity support;3 reps;30 secs    Rockerboard  Anterior/posterior;Lateral;EO;Intermittent UE support          PT Short Term  Goals - 08/01/18 1358      PT SHORT TERM GOAL #1   Title  Patient will be independent in initial HEP and perform on a regular basis.     Baseline  08/01/18:  reports complaince wiht HEP daioy.      Status  Achieved      PT SHORT TERM GOAL #2   Title  Patient will be able to stand for 15 minutes without increased complaints of pain in right foot/ankle    Baseline  08/01/18:  reports ability to stand for 5 minutes BLE weight bearing prior complaints of pain. (was 5 minutes eval    Status  On-going      PT SHORT TERM GOAL #3   Title  Patient will be able to walk for 15 minutes to improve her functional ability and return to PLOF at home and at work.     Baseline  08/01/18: reports ability to walk for 15 minutes prior pain at home.  Does not feel ready to return to work and stand for 2 hours wihtout pain.    (inital - 5 minutes)    Status  Achieved      PT SHORT TERM GOAL #4   Title  Patient will grade 4+/5 of greater in muscle groups of the Rt LE to indicate improved functional strength and balance for activities.     Baseline  10/01:  See MMT    Status  On-going        PT Long Term Goals - 08/01/18 1401      PT LONG TERM GOAL #1   Title  Patient will be independent in initial HEP and perform on a regular basis.     Baseline  08/01/18:  Reports compliance with advanced theraband HEP.      Status  Achieved      PT LONG TERM GOAL #2   Title  Patient will be able to stand for 30 minutes without increased complaints of pain in right  foot/ankle    Baseline  08/01/18:  reports ability to stand for 5 minutes BLE weight bearing prior complaints of pain. (was 5 minutes eval)    Status  On-going      PT LONG TERM GOAL #3   Title  Patient to be able to maintain right SLS on foam for at least 30 seconds bilaterally to demonstrate improved ankle stability and reduced risk of re-injury     Baseline  08/01/2018: Rt 17" on solid floor with reports of increased pain with SLS; Lt able to SLS 60" on  solid surface without pain; initial - 6 seconds right    Status  On-going      PT LONG TERM GOAL #4   Title  Patient will be able to walk for 30 minutes to improve her functional ability and return to PLOF at home and at work.     Baseline  08/01/18: reports ability to wal;k for 15 minutes prior pain at home.  Does not feel ready to return to work and stand for 2 hours wihtout pain.    (inital - 5 minutes)    Status  On-going            Plan - 08/01/18 1812    Clinical Impression Statement  Continued session focus on ankle mobilty, functional strengthening and balance training.  Reviewed goals this session with strength and mobility progressing nicely.  Pt continues to have difficulty standing and walking for prolonged period of time without pain and noted deficitis with balalnce as well.  No reports of pain through sesiosn with any therex, does continue to fatigue due to muscle fatigue.  EOS with manual therapy to address soft tissue restricitons/tenderness in posterior tib, plantar fascia and deltoid ligament.    Rehab Potential  Good    PT Frequency  2x / week    PT Duration  4 weeks    PT Treatment/Interventions  ADLs/Self Care Home Management;Therapeutic activities;Therapeutic exercise;Balance training;Neuromuscular re-education;Cognitive remediation;Gait training;Stair training;Patient/family education;Orthotic Fit/Training;Manual techniques;Passive range of motion;Joint Manipulations;Dry needling;Energy conservation;Taping    PT Next Visit Plan  Continue with seated theraband exercises. Monitor pain response to last session. Progress LE strengthening exercises and balance/proprioception eyes open/closed, solid/complaint training as able.      PT Home Exercise Plan  07/20/18:  heel/toe raises and ankle circles; 07/27/2018 - theraband long sitting ankle inversion/eversion.       Patient will benefit from skilled therapeutic intervention in order to improve the following deficits and  impairments:  Abnormal gait, Decreased activity tolerance, Decreased strength, Pain, Decreased balance, Decreased mobility, Difficulty walking, Decreased range of motion  Visit Diagnosis: Other abnormalities of gait and mobility  Muscle weakness (generalized)  Pain in left ankle and joints of left foot     Problem List Patient Active Problem List   Diagnosis Date Noted  . Irregular periods 12/14/2016  . RLQ abdominal pain 11/30/2016  . Missed periods 11/30/2016  . Right ovarian cyst 11/30/2016  . Urinary frequency 05/27/2015  . Pelvic pain in female 05/27/2015  . UTI (lower urinary tract infection) 04/28/2015   Becky Sax, LPTA; CBIS [phone]  Juel Burrow 08/01/2018, 6:18 PM   Katina Dung. Hartnett-Rands, MS, PT Per [facility] [phone]   [facility] [address] [place], [place], [address] Phone: [phone]   Fax:  [phone]  Name: ARINA TORRY MRN: [id number] Date of Birth: January 12, 1991

## 2018-08-03 ENCOUNTER — Encounter (HOSPITAL_COMMUNITY): Payer: Self-pay

## 2018-08-03 ENCOUNTER — Ambulatory Visit (HOSPITAL_COMMUNITY): Payer: BLUE CROSS/BLUE SHIELD

## 2018-08-03 DIAGNOSIS — M6281 Muscle weakness (generalized): Secondary | ICD-10-CM

## 2018-08-03 DIAGNOSIS — M25572 Pain in left ankle and joints of left foot: Secondary | ICD-10-CM

## 2018-08-03 DIAGNOSIS — R2689 Other abnormalities of gait and mobility: Secondary | ICD-10-CM

## 2018-08-03 NOTE — Therapy (Addendum)
[place], [place], [address] Phone: [phone]   Fax:  [phone]  Physical Therapy Treatment  Patient Details  Name: Cathy Wells MRN: [id number] Date of Birth: 09/01/1991 Referring Provider (PT): Sanjuana Kava, MD PHYSICAL THERAPY DISCHARGE SUMMARY  Visits from Start of Care: 6 of 10  Current functional level related to goals / functional outcomes:  Pt stated ankle feels good today, no reports of pain.  Has switched to green theraband and has began balance activities at home.  Ankle mobility, strengthening and stability to assist with balance and gait.  Progressed to dynamic surface balance activities and SLS activities for hip and ankle stability.  Reviewed compliance with HEP, increased resistance with theraband for strenthening.  Pt tolerated well with treatment, reports of fatigue with static balance activities though no reports of pain.      Plan: Patient agrees to discharge.  Patient goals were not met. Patient is being discharged due to not returning since the last visit.  ?????      Floria Raveling. Hartnett-Rands, MS, PT [facility] #[id number]   Encounter Date: 08/03/2018  PT End of Session - 08/03/18 1355    Visit Number  6    Number of Visits  10    Date for PT Re-Evaluation  08/09/18   Minireassess complete 08/01/18   Authorization Type  [employer] - no co-pay, co-insurance 20%; 60 visits comined PT OT SLP; 1 visit per day    Authorization Time Period  Cert: 4/65--> 68/12/75    Authorization - Visit Number  6    Authorization - Number of Visits  10    PT Start Time  1350    PT Stop Time  1432    PT Time Calculation (min)  42 min    Activity Tolerance  Patient tolerated treatment well    Behavior During Therapy  WFL for tasks assessed/performed       Past Medical History:  Diagnosis Date  . Complication of anesthesia    PONV  . Frequency of urination   . GERD  (gastroesophageal reflux disease)   . Hematuria   . Left ureteral [place]   . Mild asthma   . Ovarian cyst   . Urgency of urination   . Wears contact lenses     Past Surgical History:  Procedure Laterality Date  . APPENDECTOMY  07/25/2006   OPEN  . URETEROSCOPY WITH HOLMIUM LASER LITHOTRIPSY Left 07/21/2017   Procedure: retrogrades, URETEROSCOPY WITH [place] basketry, stent placement;  Surgeon: Ardis Hughs, MD;  Location: [facility];  Service: Urology;  Laterality: Left;    There were no vitals filed for this visit.  Subjective Assessment - 08/03/18 1353    Subjective  Pt stated ankle feels good today, no reports of pain.  Has switched to green theraband and has began balance activities at home.      Pertinent History  allergies, asthma, anxiety, depression, kidney stones`    Patient Stated Goals  get back to PLOF, strengthen ankle     Currently in Pain?  No/denies                       [facility] Adult PT Treatment/Exercise - 08/03/18 0001      Manual Therapy   Manual Therapy  Soft tissue mobilization    Manual therapy comments  Completed separate from all other skilled interventions    Soft  tissue mobilization  posterior tibialis tendon/muscle, plantar fascia, deltoid ligament      Ankle Exercises: Standing   BAPS  Standing;Level 2;10 reps    SLS  Rt 32", Lt 43" max of 5    Heel Raises  Both;20 reps;3 seconds   slope   Toe Raise  15 reps;3 seconds   slope     Ankle Exercises: Supine   T-Band  BTB 15x each direcition 5" holds.  Instructed easier positioning for Inv motion          Balance Exercises - 08/03/18 1407      Balance Exercises: Standing   Tandem Stance  Eyes open;2 reps;30 secs   1st on foam 2nd on half bolster   SLS  Eyes open;Foam/compliant surface;Intermittent upper extremity support;3 reps;30 secs   Rt 32, Lt 43" max   SLS with Vectors  Solid surface;5 reps   5x 5" no HHA         PT Short Term Goals -  08/01/18 1358      PT SHORT TERM GOAL #1   Title  Patient will be independent in initial HEP and perform on a regular basis.     Baseline  08/01/18:  reports complaince wiht HEP daioy.      Status  Achieved      PT SHORT TERM GOAL #2   Title  Patient will be able to stand for 15 minutes without increased complaints of pain in right foot/ankle    Baseline  08/01/18:  reports ability to stand for 5 minutes BLE weight bearing prior complaints of pain. (was 5 minutes eval    Status  On-going      PT SHORT TERM GOAL #3   Title  Patient will be able to walk for 15 minutes to improve her functional ability and return to PLOF at home and at work.     Baseline  08/01/18: reports ability to walk for 15 minutes prior pain at home.  Does not feel ready to return to work and stand for 2 hours wihtout pain.    (inital - 5 minutes)    Status  Achieved      PT SHORT TERM GOAL #4   Title  Patient will grade 4+/5 of greater in muscle groups of the Rt LE to indicate improved functional strength and balance for activities.     Baseline  10/01:  See MMT    Status  On-going        PT Long Term Goals - 08/01/18 1401      PT LONG TERM GOAL #1   Title  Patient will be independent in initial HEP and perform on a regular basis.     Baseline  08/01/18:  Reports compliance with advanced theraband HEP.      Status  Achieved      PT LONG TERM GOAL #2   Title  Patient will be able to stand for 30 minutes without increased complaints of pain in right foot/ankle    Baseline  08/01/18:  reports ability to stand for 5 minutes BLE weight bearing prior complaints of pain. (was 5 minutes eval)    Status  On-going      PT LONG TERM GOAL #3   Title  Patient to be able to maintain right SLS on foam for at least 30 seconds bilaterally to demonstrate improved ankle stability and reduced risk of re-injury     Baseline  08/01/2018: Rt 17" on solid floor with reports of  increased pain with SLS; Lt able to SLS 60" on solid  surface without pain; initial - 6 seconds right    Status  On-going      PT LONG TERM GOAL #4   Title  Patient will be able to walk for 30 minutes to improve her functional ability and return to PLOF at home and at work.     Baseline  08/01/18: reports ability to wal;k for 15 minutes prior pain at home.  Does not feel ready to return to work and stand for 2 hours wihtout pain.    (inital - 5 minutes)    Status  On-going            Plan - 08/03/18 1416    Clinical Impression Statement  Continued session foucs on ankle mobility, strengthening and stability to assist with balance and gait.  Progressed to dynamic surface balance activities and SLS activities for hip and ankle stability.  Reviewed compliance with HEP, increased resistance with theraband for strenthening.  Pt tolerated well with treatment, reports of fatigue with static balance activities though no reports of pain.      Rehab Potential  Good    PT Frequency  2x / week    PT Duration  4 weeks    PT Treatment/Interventions  ADLs/Self Care Home Management;Therapeutic activities;Therapeutic exercise;Balance training;Neuromuscular re-education;Cognitive remediation;Gait training;Stair training;Patient/family education;Orthotic Fit/Training;Manual techniques;Passive range of motion;Joint Manipulations;Dry needling;Energy conservation;Taping    PT Next Visit Plan  Next session add standing arch formation exercises for posterior tib.  Monitor pain response to last session.  Introduce posterior tib kinesiotaping PRN. Progress LE strengthening exercises and balance/proprioception eyes open/closed, solid/complaint training as able.      PT Home Exercise Plan  07/20/18:  heel/toe raises and ankle circles; 07/27/2018 - theraband long sitting ankle inversion/eversion.       Patient will benefit from skilled therapeutic intervention in order to improve the following deficits and impairments:  Abnormal gait, Decreased activity tolerance,  Decreased strength, Pain, Decreased balance, Decreased mobility, Difficulty walking, Decreased range of motion  Visit Diagnosis: Other abnormalities of gait and mobility  Muscle weakness (generalized)  Pain in left ankle and joints of left foot     Problem List Patient Active Problem List   Diagnosis Date Noted  . Irregular periods 12/14/2016  . RLQ abdominal pain 11/30/2016  . Missed periods 11/30/2016  . Right ovarian cyst 11/30/2016  . Urinary frequency 05/27/2015  . Pelvic pain in female 05/27/2015  . UTI (lower urinary tract infection) 04/28/2015    Aldona Lento 08/03/2018, 6:14 PM  [place], [place], [address] Phone: [phone]   Fax:  [phone]  Name: ZYLEE MARCHIANO MRN: [id number] Date of Birth: Sep 14, 1991

## 2018-08-08 ENCOUNTER — Telehealth (HOSPITAL_COMMUNITY): Payer: Self-pay | Admitting: Physical Therapy

## 2018-08-08 ENCOUNTER — Ambulatory Visit (HOSPITAL_COMMUNITY): Payer: Self-pay | Admitting: Physical Therapy

## 2018-08-08 NOTE — Telephone Encounter (Signed)
She will not be able to make it today

## 2018-08-09 ENCOUNTER — Encounter: Payer: Self-pay | Admitting: Orthopaedic Surgery

## 2018-08-09 ENCOUNTER — Ambulatory Visit (INDEPENDENT_AMBULATORY_CARE_PROVIDER_SITE_OTHER): Payer: Self-pay | Admitting: Orthopaedic Surgery

## 2018-08-09 VITALS — BP 129/88 | HR 99 | Ht 63.0 in | Wt 226.0 lb

## 2018-08-09 DIAGNOSIS — M25571 Pain in right ankle and joints of right foot: Secondary | ICD-10-CM

## 2018-08-09 DIAGNOSIS — Z6841 Body Mass Index (BMI) 40.0 and over, adult: Secondary | ICD-10-CM

## 2018-08-09 NOTE — Progress Notes (Signed)
CC:  My ankle is better  She has no pain of the right ankle now.  She is walking well.  NV intact. She has normal gait. She has full ROM of the right ankle.  Encounter Diagnoses  Name Primary?  . Acute right ankle pain Yes  . Body mass index 40.0-44.9, adult (HCC)   . Morbid obesity (HCC)    I will see her as needed.  Call if any problem.  Precautions discussed.   Electronically Signed Darreld Mclean, MD 10/9/20192:18 PM

## 2018-08-10 ENCOUNTER — Telehealth (HOSPITAL_COMMUNITY): Payer: Self-pay

## 2018-08-10 ENCOUNTER — Ambulatory Visit (HOSPITAL_COMMUNITY): Payer: Self-pay

## 2018-08-10 NOTE — Telephone Encounter (Signed)
PT left a message stating patient missed her appointment today at 1:00 pm, to please call the clinic at [phone], and her next appointment is next Tuesday, Oct 15th at 1:45 pm.   Cathy Wells. Hartnett-Rands, MS, PT [facility] [place] [phone]

## 2018-08-15 ENCOUNTER — Ambulatory Visit (HOSPITAL_COMMUNITY): Payer: Self-pay

## 2018-08-15 ENCOUNTER — Telehealth (HOSPITAL_COMMUNITY): Payer: Self-pay

## 2018-08-15 NOTE — Telephone Encounter (Signed)
PT left a message stating patient missed her appointment today at 1:45 pm, to please call the clinic at [phone], and her next appointment is next Tuesday, Oct 17th at 1:0 pm. All other appointments have been cancelled.  Katina Dung. Hartnett-Rands, MS, PT [facility] [employer] [phone] 08/15/2018

## 2018-08-17 ENCOUNTER — Ambulatory Visit (HOSPITAL_COMMUNITY): Payer: Self-pay

## 2018-08-22 ENCOUNTER — Encounter (HOSPITAL_COMMUNITY): Payer: BLUE CROSS/BLUE SHIELD

## 2018-08-24 ENCOUNTER — Encounter (HOSPITAL_COMMUNITY): Payer: BLUE CROSS/BLUE SHIELD

## 2019-06-05 ENCOUNTER — Encounter: Payer: Self-pay | Admitting: Adult Health

## 2019-06-18 ENCOUNTER — Other Ambulatory Visit: Payer: Self-pay | Admitting: Adult Health

## 2019-07-05 ENCOUNTER — Other Ambulatory Visit: Payer: Self-pay | Admitting: *Deleted

## 2019-07-05 DIAGNOSIS — Z20822 Contact with and (suspected) exposure to covid-19: Secondary | ICD-10-CM

## 2019-07-06 LAB — NOVEL CORONAVIRUS, NAA: SARS-CoV-2, NAA: NOT DETECTED

## 2019-08-21 ENCOUNTER — Other Ambulatory Visit: Payer: Self-pay | Admitting: *Deleted

## 2019-08-21 DIAGNOSIS — Z20822 Contact with and (suspected) exposure to covid-19: Secondary | ICD-10-CM

## 2019-08-22 LAB — NOVEL CORONAVIRUS, NAA: SARS-CoV-2, NAA: NOT DETECTED

## 2019-10-12 ENCOUNTER — Other Ambulatory Visit: Payer: Self-pay

## 2019-10-12 DIAGNOSIS — Z20822 Contact with and (suspected) exposure to covid-19: Secondary | ICD-10-CM

## 2019-10-13 LAB — NOVEL CORONAVIRUS, NAA: SARS-CoV-2, NAA: DETECTED — AB

## 2020-02-22 ENCOUNTER — Ambulatory Visit
Admission: EM | Admit: 2020-02-22 | Discharge: 2020-02-22 | Disposition: A | Discharge status: Home / Self Care | Payer: 59 | Source: Home / Self Care

## 2020-02-22 ENCOUNTER — Emergency Department (HOSPITAL_COMMUNITY)
Admission: EM | Admit: 2020-02-22 | Discharge: 2020-02-22 | Disposition: A | Discharge status: Home / Self Care | Payer: 59 | Attending: Emergency Medicine | Admitting: Emergency Medicine

## 2020-02-22 ENCOUNTER — Encounter (HOSPITAL_COMMUNITY): Payer: Self-pay | Admitting: *Deleted

## 2020-02-22 ENCOUNTER — Other Ambulatory Visit: Payer: Self-pay

## 2020-02-22 DIAGNOSIS — R519 Headache, unspecified: Secondary | ICD-10-CM | POA: Diagnosis not present

## 2020-02-22 DIAGNOSIS — R220 Localized swelling, mass and lump, head: Secondary | ICD-10-CM

## 2020-02-22 DIAGNOSIS — K1120 Sialoadenitis, unspecified: Secondary | ICD-10-CM | POA: Insufficient documentation

## 2020-02-22 DIAGNOSIS — I10 Essential (primary) hypertension: Secondary | ICD-10-CM | POA: Diagnosis not present

## 2020-02-22 DIAGNOSIS — Z79899 Other long term (current) drug therapy: Secondary | ICD-10-CM | POA: Diagnosis not present

## 2020-02-22 MED ORDER — PREDNISONE 10 MG PO TABS: 20.0000 mg | ORAL_TABLET | Freq: Every day | ORAL | 0 refills | Status: DC

## 2020-02-22 MED ORDER — CLINDAMYCIN HCL 150 MG PO CAPS: 150.0000 mg | ORAL_CAPSULE | Freq: Four times a day (QID) | ORAL | 0 refills | Status: DC

## 2020-02-22 MED ORDER — IBUPROFEN 600 MG PO TABS: 600.0000 mg | ORAL_TABLET | Freq: Four times a day (QID) | ORAL | 0 refills | Status: DC | PRN

## 2020-02-22 MED ORDER — CLONIDINE HCL ER 0.1 MG PO TB12
0.2000 mg | ORAL_TABLET | Freq: Once | ORAL | Status: AC
  Administered 2020-02-22: 16:00:00 0.2 mg via ORAL

## 2020-02-22 NOTE — Discharge Instructions (Signed)
Take the Clindamycin that you were given at the urgent care - this is the right antibiotic incase this is infected  Suck on lemon drops - this is more likely to be a Guerin in the parotid gland - warm compresses - may take a week or longer to get better  Return to ER for severe swelling / fever or redness of the face.

## 2020-02-22 NOTE — ED Triage Notes (Signed)
Patient presents to the ED with left sided facial swelling that began this morning around 0600.

## 2020-02-22 NOTE — ED Provider Notes (Signed)
[facility]    CSN: [id number] Arrival date & time: 02/22/20  1444      History   Chief Complaint Chief Complaint  Patient presents with  . Facial Swelling    HPI IMOJEAN YOSHINO is a 29 y.o. female.   Who presented to the urgent care with a complaint of left facial swelling and pain that started last night.  Denies any precipitating event.  Localized pain to the left side of her face.  Described the pain as constant and achy, rated at 7 on a scale of 1-10.  Has tried OTC Tylenol without relief.  Symptoms are made worse with chewing.  Denies similar symptoms in the past.  Denies chills, fever, nausea, vomiting, diarrhea, otalgia, dental  abscess or caries.  The history is provided by the patient.    Past Medical History:  Diagnosis Date  . Complication of anesthesia    PONV  . Frequency of urination   . GERD (gastroesophageal reflux disease)   . Hematuria   . Left ureteral [place]   . Mild asthma   . Ovarian cyst   . Urgency of urination   . Wears contact lenses     Patient Active Problem List   Diagnosis Date Noted  . Irregular periods 12/14/2016  . RLQ abdominal pain 11/30/2016  . Missed periods 11/30/2016  . Right ovarian cyst 11/30/2016  . Urinary frequency 05/27/2015  . Pelvic pain in female 05/27/2015  . UTI (lower urinary tract infection) 04/28/2015    Past Surgical History:  Procedure Laterality Date  . APPENDECTOMY  07/25/2006   OPEN  . URETEROSCOPY WITH HOLMIUM LASER LITHOTRIPSY Left 07/21/2017   Procedure: retrogrades, URETEROSCOPY WITH [place] basketry, stent placement;  Surgeon: Crist Fat, MD;  Location: [facility];  Service: Urology;  Laterality: Left;    OB History    Gravida  0   Para  0   Term  0   Preterm  0   AB  0   Living  0     SAB  0   TAB  0   Ectopic  0   Multiple  0   Live Births               Home Medications    Prior to Admission medications   Medication Sig Start  Date End Date Taking? Authorizing Provider  acetaminophen (TYLENOL) 500 MG tablet Take 500 mg by mouth as needed.    [provider]  albuterol (PROVENTIL HFA;VENTOLIN HFA) 108 (90 Base) MCG/ACT inhaler Inhale 1-2 puffs into the lungs every 6 (six) hours as needed for wheezing or shortness of breath.    [provider]  ALPRAZolam Prudy Feeler) 0.5 MG tablet Take 0.5 mg by mouth 2 (two) times daily as needed. 10/31/19   [provider]  clindamycin (CLEOCIN) 150 MG capsule Take 1 capsule (150 mg total) by mouth every 6 (six) hours. 02/22/20   Laysha Childers, Zachery Dakins, FNP  escitalopram (LEXAPRO) 20 MG tablet  05/30/18   [provider]  HYDROcodone-acetaminophen (NORCO/VICODIN) 5-325 MG tablet One tablet every four hours as needed for acute pain.  Limit of five days per [place]. 06/15/18   Darreld Mclean, MD  ibuprofen (ADVIL) 600 MG tablet Take 1 tablet (600 mg total) by mouth every 6 (six) hours as needed. 02/22/20   Wilhemina Grall, Zachery Dakins, FNP  olmesartan-hydrochlorothiazide (BENICAR HCT) 40-25 MG tablet Take 1 tablet by mouth daily. 10/08/19   [provider]  pantoprazole (PROTONIX) 40 MG tablet Take 40 mg by mouth daily as needed.     [provider]  predniSONE (DELTASONE) 10 MG tablet Take 2 tablets (20 mg total) by mouth daily. 02/22/20   AvegnoDarrelyn Hillock, FNP    Family History Family History  Problem Relation Age of Onset  . Hypertension Mother   . Asthma Father   . Hypertension Father   . Hypertension Maternal Grandfather   . Diabetes Paternal Grandmother   . Hypertension Paternal Grandmother   . COPD Paternal Grandfather     Social History Social History   Tobacco Use  . Smoking status: Never Smoker  . Smokeless tobacco: Never Used  Substance Use Topics  . Alcohol use: No  . Drug use: No     Allergies   Doxycycline, Amoxapine and related, Azithromycin, Clarithromycin, Erythromycin, Morphine and related, Penicillins, and Sulfa  antibiotics   Review of Systems Review of Systems  Constitutional: Negative.   HENT: Positive for facial swelling.   Respiratory: Negative.   Cardiovascular: Negative.   All other systems reviewed and are negative.    Physical Exam Triage Vital Signs ED Triage Vitals  Enc Vitals Group     BP 02/22/20 1458 (!) 169/110    148/100     Pulse --      Resp 02/22/20 1458 18     Temp 02/22/20 1458 98.1 F (36.7 C)     Temp src --      SpO2 02/22/20 1458 96 %     Weight --      Height --      Head Circumference --      Peak Flow --      Pain Score 02/22/20 1455 7     Pain Loc --      Pain Edu? --      Excl. in [place]? --    No data found.  Updated Vital Signs BP (!) 148/100   Temp 98.1 F (36.7 C)   Resp 18   LMP 02/11/2020   SpO2 96%   Visual Acuity Right Eye Distance:   Left Eye Distance:   Bilateral Distance:    Right Eye Near:   Left Eye Near:    Bilateral Near:     Physical Exam Vitals and nursing note reviewed.  Constitutional:      General: She is not in acute distress.    Appearance: Normal appearance. She is normal weight. She is not ill-appearing, toxic-appearing or diaphoretic.  HENT:     Head: Normocephalic.     Right Ear: Tympanic membrane, ear canal and external ear normal. There is no impacted cerumen.     Left Ear: Tympanic membrane, ear canal and external ear normal. There is no impacted cerumen.     Mouth/Throat:     Lips: Pink.     Mouth: Mucous membranes are moist.     Dentition: Normal dentition. No dental caries or dental abscesses.     Tonsils: 1+ on the right. 1+ on the left.  Cardiovascular:     Rate and Rhythm: Normal rate and regular rhythm.     Pulses: Normal pulses.     Heart sounds: Normal heart sounds. No murmur. No friction rub. No gallop.   Pulmonary:     Effort: Pulmonary effort is normal. No respiratory distress.     Breath sounds: Normal breath sounds. No stridor. No wheezing, rhonchi or rales.  Chest:     Chest wall: No  tenderness.  Musculoskeletal:     Cervical back: Normal range of motion. Tenderness present. No rigidity.  Lymphadenopathy:     Cervical: No cervical adenopathy.  Neurological:     Mental Status: She is alert.      UC Treatments / Results  Labs (all labs ordered are listed, but only abnormal results are displayed) Labs Reviewed - No data to display  EKG   Radiology No results found.  Procedures Procedures (including critical care time)  Medications Ordered in UC Medications  cloNIDine HCl (KAPVAY) ER tablet 0.2 mg (0.2 mg Oral Given 02/22/20 1545)    Initial Impression / Assessment and Plan / UC Course  I have reviewed the triage vital signs and the nursing notes.  Pertinent labs & imaging results that were available during my care of the patient were reviewed by me and considered in my medical decision making (see chart for details).    Patient is stable at discharge.  Will prescribe prednisone and ibuprofen to manage pain and swelling.  Will prescribe Augmentin for possible infection.  Clonidine 0.2 mg was given in office due to elevated blood pressure.  Reports she has not been taking her blood pressure medication for a few days.  Final Clinical Impressions(s) / UC Diagnoses   Final diagnoses:  Facial swelling  Facial pain  Essential hypertension     Discharge Instructions     Clonidine 0.2 mg was given in office. Restart daily blood pressure medication tomorrow and follow-up with PCP for reevaluation Take antibiotic as prescribed and to completion Ibuprofen prescribed /take it with food Prednisone was prescribed Follow-up with primary care Return or go to ED for worsening of symptoms.    ED Prescriptions    Medication Sig Dispense Auth. Provider   clindamycin (CLEOCIN) 150 MG capsule Take 1 capsule (150 mg total) by mouth every 6 (six) hours. 28 capsule Shaylee Stanislawski S, FNP   predniSONE (DELTASONE) 10 MG tablet Take 2 tablets (20 mg total) by mouth  daily. 15 tablet Cathy Crounse S, FNP   ibuprofen (ADVIL) 600 MG tablet Take 1 tablet (600 mg total) by mouth every 6 (six) hours as needed. 30 tablet Merrell Borsuk, Zachery Dakins, FNP     I have reviewed the PDMP during this encounter.   Durward Parcel, FNP 02/22/20 1550

## 2020-02-22 NOTE — ED Provider Notes (Signed)
[facility] EMERGENCY DEPARTMENT Provider Note   CSN: [id number] Arrival date & time: 02/22/20  1559     History Chief Complaint  Patient presents with  . Facial Swelling    left     Cathy Wells is a 29 y.o. female.  HPI   29 y/o female, with L sided facial swelling - started this morning - no fevers - sx are constant worse with opening mouth and palpation of cheek - not assocaited with dental pain - no hx of recent dental surgery / procedures - she does have kidney stones in the past.  Seen a[facility] UC today - given clinda - wants second opinion  Past Medical History:  Diagnosis Date  . Complication of anesthesia    PONV  . Frequency of urination   . GERD (gastroesophageal reflux disease)   . Hematuria   . Left ureteral [place]   . Mild asthma   . Ovarian cyst   . Urgency of urination   . Wears contact lenses     Patient Active Problem List   Diagnosis Date Noted  . Irregular periods 12/14/2016  . RLQ abdominal pain 11/30/2016  . Missed periods 11/30/2016  . Right ovarian cyst 11/30/2016  . Urinary frequency 05/27/2015  . Pelvic pain in female 05/27/2015  . UTI (lower urinary tract infection) 04/28/2015    Past Surgical History:  Procedure Laterality Date  . APPENDECTOMY  07/25/2006   OPEN  . URETEROSCOPY WITH HOLMIUM LASER LITHOTRIPSY Left 07/21/2017   Procedure: retrogrades, URETEROSCOPY WITH [place] basketry, stent placement;  Surgeon: Crist Fat, MD;  Location: [facility];  Service: Urology;  Laterality: Left;     OB History    Gravida  0   Para  0   Term  0   Preterm  0   AB  0   Living  0     SAB  0   TAB  0   Ectopic  0   Multiple  0   Live Births              Family History  Problem Relation Age of Onset  . Hypertension Mother   . Asthma Father   . Hypertension Father   . Hypertension Maternal Grandfather   . Diabetes Paternal Grandmother   . Hypertension Paternal Grandmother   . COPD Paternal  Grandfather     Social History   Tobacco Use  . Smoking status: Never Smoker  . Smokeless tobacco: Never Used  Substance Use Topics  . Alcohol use: No  . Drug use: No    Home Medications Prior to Admission medications   Medication Sig Start Date End Date Taking? Authorizing Provider  acetaminophen (TYLENOL) 500 MG tablet Take 500 mg by mouth as needed.    [provider]  albuterol (PROVENTIL HFA;VENTOLIN HFA) 108 (90 Base) MCG/ACT inhaler Inhale 1-2 puffs into the lungs every 6 (six) hours as needed for wheezing or shortness of breath.    [provider]  ALPRAZolam Prudy Feeler) 0.5 MG tablet Take 0.5 mg by mouth 2 (two) times daily as needed. 10/31/19   [provider]  clindamycin (CLEOCIN) 150 MG capsule Take 1 capsule (150 mg total) by mouth every 6 (six) hours. 02/22/20   Avegno, Zachery Dakins, FNP  escitalopram (LEXAPRO) 20 MG tablet  05/30/18   [provider]  HYDROcodone-acetaminophen (NORCO/VICODIN) 5-325 MG tablet One tablet every four hours as needed for acute pain.  Limit of five days per  [place]. 06/15/18   Sanjuana Kava, MD  ibuprofen (ADVIL) 600 MG tablet Take 1 tablet (600 mg total) by mouth every 6 (six) hours as needed. 02/22/20   Avegno, Darrelyn Hillock, FNP  olmesartan-hydrochlorothiazide (BENICAR HCT) 40-25 MG tablet Take 1 tablet by mouth daily. 10/08/19   [provider]  pantoprazole (PROTONIX) 40 MG tablet Take 40 mg by mouth daily as needed.     [provider]  predniSONE (DELTASONE) 10 MG tablet Take 2 tablets (20 mg total) by mouth daily. 02/22/20   Emerson Monte, FNP    Allergies    Doxycycline, Amoxapine and related, Azithromycin, Clarithromycin, Erythromycin, Morphine and related, Penicillins, and Sulfa antibiotics  Review of Systems   Review of Systems  Constitutional: Negative for fever.  HENT: Positive for facial swelling.     Physical Exam Updated Vital Signs BP (!) 146/109 (BP Location:  Right Arm)   Pulse 93   Temp 98.1 F (36.7 C) (Oral)   Resp 17   Ht 1.575 m (5\' 2" )   Wt 109.3 kg   LMP 02/11/2020   SpO2 100%   BMI 44.08 kg/m   Physical Exam Constitutional:      Appearance: She is not ill-appearing.  HENT:     Head: Normocephalic and atraumatic.     Mouth/Throat:     Mouth: Mucous membranes are moist.     Pharynx: No oropharyngeal exudate or posterior oropharyngeal erythema.     Comments: No trismus or torticollis - OP clear, cheek mild ttp over the parotid - no drainage from nose - no PND, L lower wisdom tooth is impacted.  She has mild swelling over parotid Eyes:     General:        Right eye: No discharge.        Left eye: No discharge.     Conjunctiva/sclera: Conjunctivae normal.  Cardiovascular:     Rate and Rhythm: Normal rate and regular rhythm.  Pulmonary:     Effort: Pulmonary effort is normal. No respiratory distress.     Breath sounds: No wheezing.  Skin:    Coloration: Skin is not jaundiced.  Neurological:     General: No focal deficit present.     Mental Status: She is alert. Mental status is at baseline.     ED Results / Procedures / Treatments   Labs (all labs ordered are listed, but only abnormal results are displayed) Labs Reviewed - No data to display  EKG None  Radiology No results found.  Procedures Procedures (including critical care time)  Medications Ordered in ED Medications - No data to display  ED Course  I have reviewed the triage vital signs and the nursing notes.  Pertinent labs & imaging results that were available during my care of the patient were reviewed by me and considered in my medical decision making (see chart for details).    MDM Rules/Calculators/A&P                      Has parotitis vs sialolithiasis Warm compresses, lemon drops Clindamycin -  Pt agreeable -   Final Clinical Impression(s) / ED Diagnoses Final diagnoses:  Parotitis    Rx / DC Orders ED Discharge Orders    None        Noemi Chapel, MD 02/22/20 1635

## 2020-02-22 NOTE — ED Triage Notes (Signed)
Pt presents with c/o left side facial swelling that began last night

## 2020-02-22 NOTE — Discharge Instructions (Addendum)
Clonidine 0.2 mg was given in office. Restart daily blood pressure medication tomorrow and follow-up with PCP for reevaluation Take antibiotic as prescribed and to completion Ibuprofen prescribed /take it with food Prednisone was prescribed Follow-up with primary care Return or go to ED for worsening of symptoms.

## 2020-05-24 ENCOUNTER — Emergency Department (HOSPITAL_COMMUNITY): Payer: 59

## 2020-05-24 ENCOUNTER — Other Ambulatory Visit: Payer: Self-pay

## 2020-05-24 ENCOUNTER — Encounter (HOSPITAL_COMMUNITY): Payer: Self-pay | Admitting: Emergency Medicine

## 2020-05-24 ENCOUNTER — Emergency Department (HOSPITAL_COMMUNITY)
Admission: EM | Admit: 2020-05-24 | Discharge: 2020-05-25 | Disposition: A | Discharge status: Home / Self Care | Payer: 59 | Attending: Emergency Medicine | Admitting: Emergency Medicine

## 2020-05-24 DIAGNOSIS — Y999 Unspecified external cause status: Secondary | ICD-10-CM | POA: Diagnosis not present

## 2020-05-24 DIAGNOSIS — Y9301 Activity, walking, marching and hiking: Secondary | ICD-10-CM | POA: Diagnosis not present

## 2020-05-24 DIAGNOSIS — Y929 Unspecified place or not applicable: Secondary | ICD-10-CM | POA: Diagnosis not present

## 2020-05-24 DIAGNOSIS — S99912A Unspecified injury of left ankle, initial encounter: Secondary | ICD-10-CM | POA: Diagnosis present

## 2020-05-24 DIAGNOSIS — W108XXA Fall (on) (from) other stairs and steps, initial encounter: Secondary | ICD-10-CM | POA: Insufficient documentation

## 2020-05-24 DIAGNOSIS — S93402A Sprain of unspecified ligament of left ankle, initial encounter: Secondary | ICD-10-CM | POA: Diagnosis not present

## 2020-05-24 IMAGING — DX DG ANKLE COMPLETE 3+V*L*
3 series · 3 of 3 positions shown · non-contrast
Comparison: [DATE]

CLINICAL DATA: Pain

EXAM:
LEFT ANKLE COMPLETE - 3+ VIEW

[ankle ap]
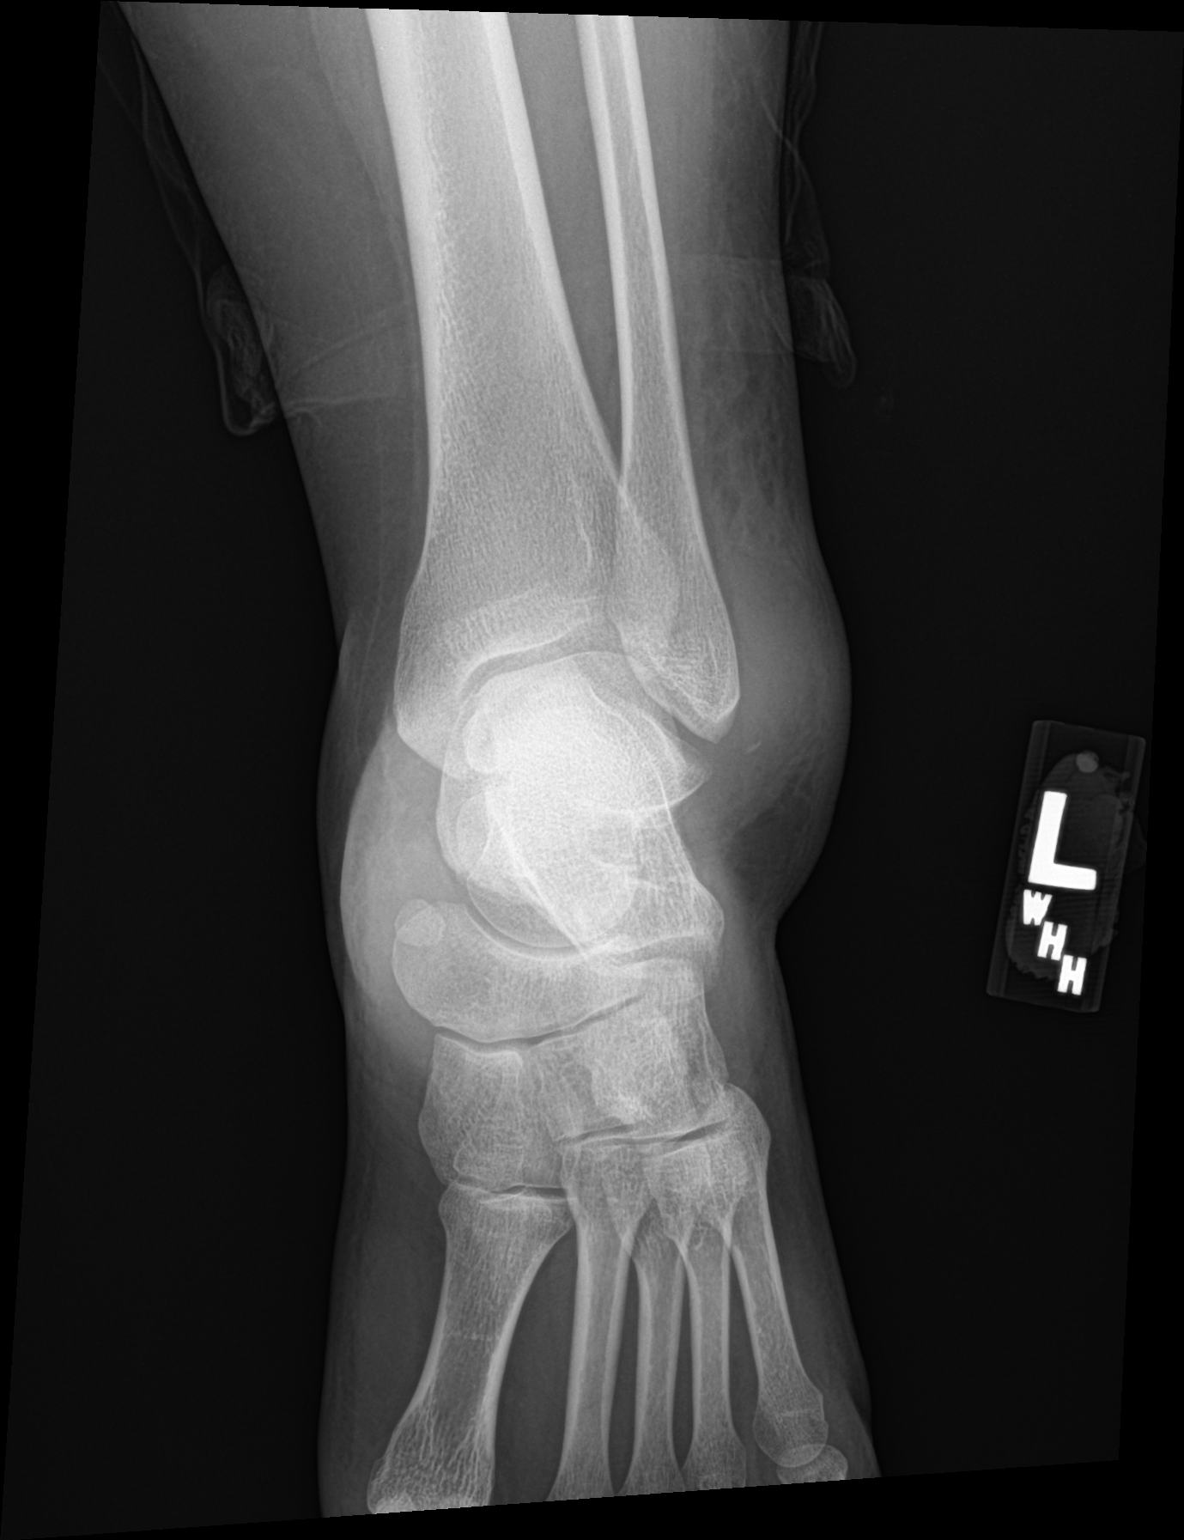

[ankle obl]
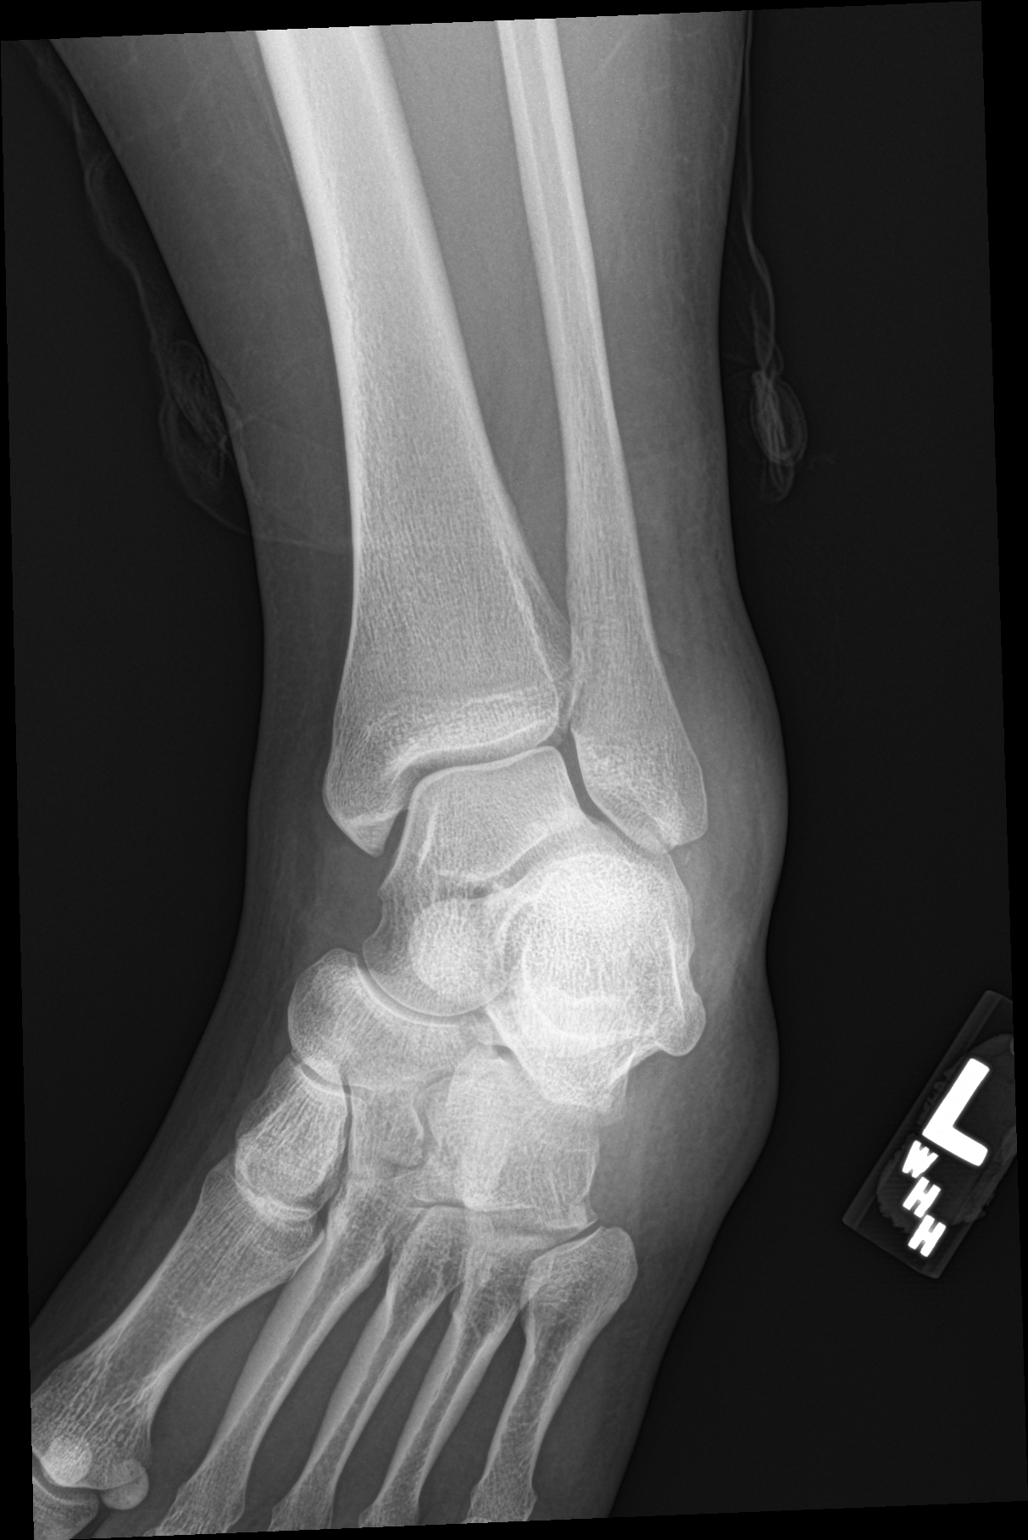

[ankle lat]
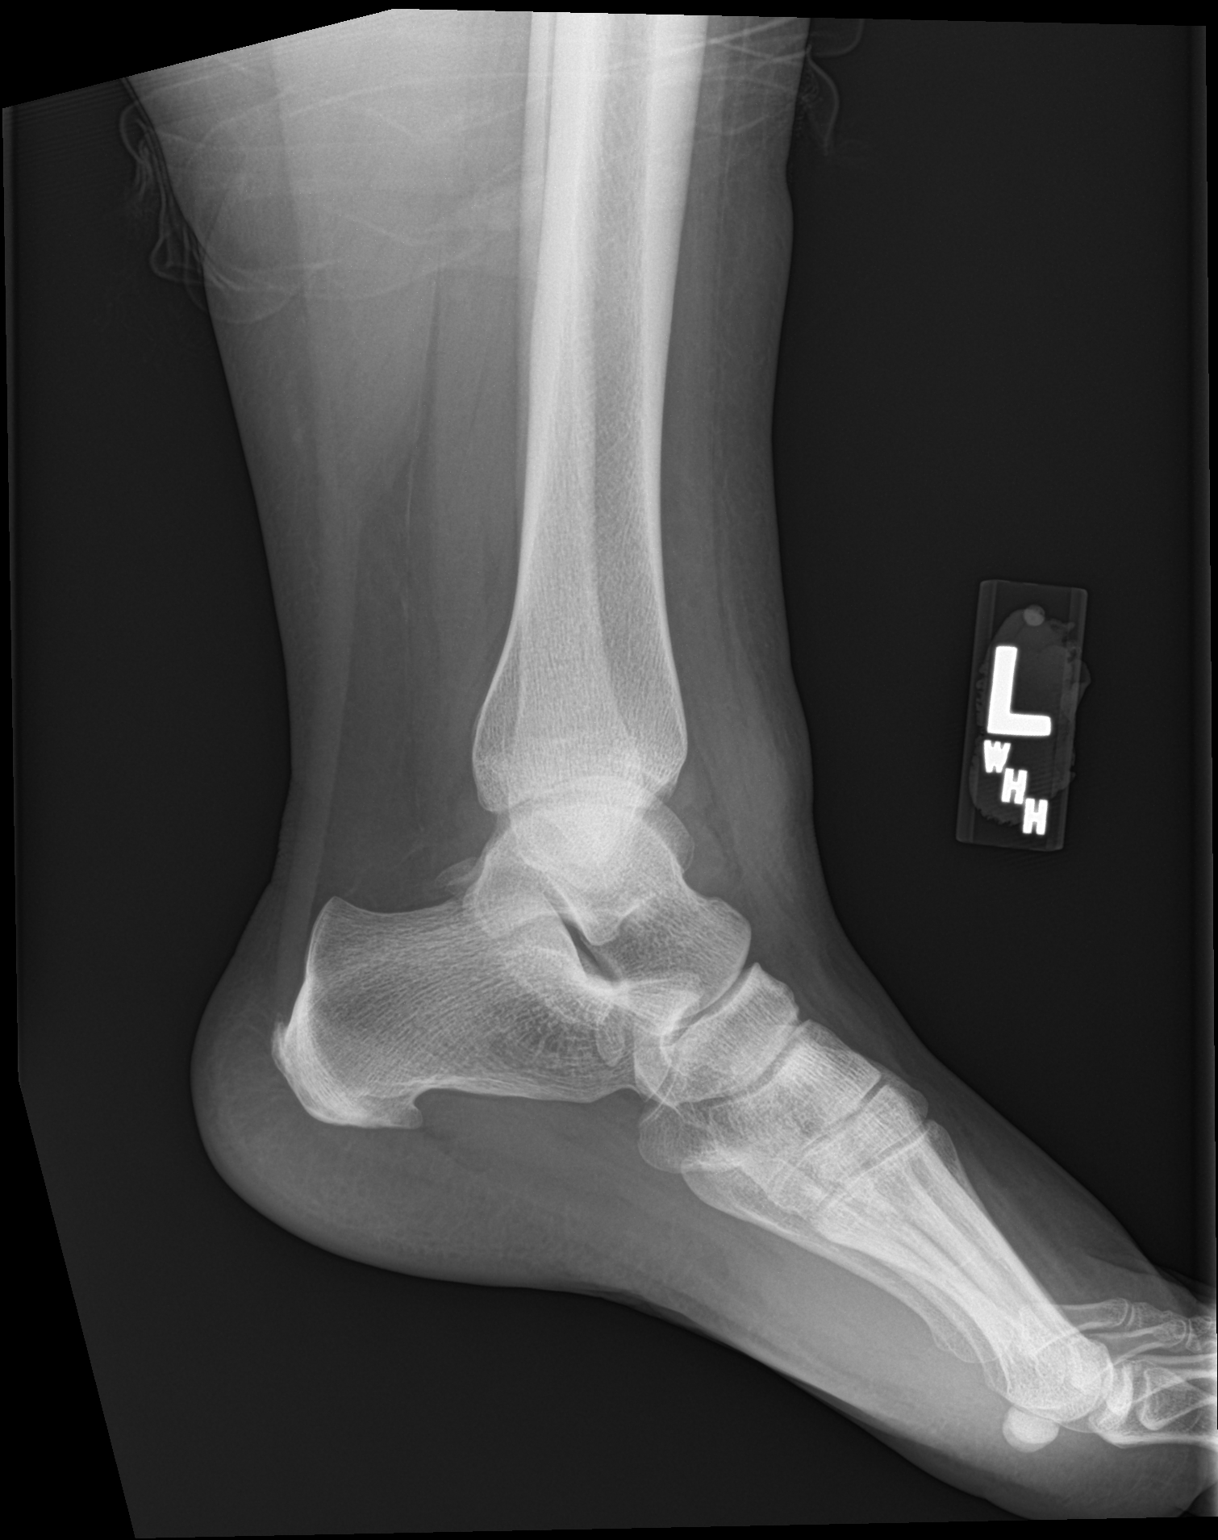

[3 of 3 positions shown; findings below may reference images not displayed]

FINDINGS: There is extensive soft tissue swelling about the lateral malleolus.
There is a 3 mm osseous fragment adjacent to the distal aspect of
the lateral malleolus. There is no dislocation. There is a
moderate-sized plantar calcaneal spur. There is a joint effusion.
IMPRESSION: 1. Extensive soft tissue swelling about the lateral malleolus.
2. A 3 mm osseous fragment adjacent to the distal aspect of the
lateral malleolus may represent an avulsion injury.

## 2020-05-24 MED ORDER — HYDROCODONE-ACETAMINOPHEN 5-325 MG PO TABS
1.0000 | ORAL_TABLET | Freq: Once | ORAL | Status: AC
  Administered 2020-05-24: 1 via ORAL
  Filled 2020-05-24: qty 1

## 2020-05-24 MED ORDER — IBUPROFEN 800 MG PO TABS: 800.0000 mg | ORAL_TABLET | Freq: Three times a day (TID) | ORAL | 0 refills | Status: DC | PRN

## 2020-05-24 NOTE — Discharge Instructions (Signed)
You were seen in the emergency department today with left ankle sprain.  Please use the boot and crutches as needed for pain relief.  Please follow closely with the orthopedic surgeon listed who you have seen in the past.  Return to the emergency department any new or suddenly worsening symptoms.

## 2020-05-24 NOTE — ED Provider Notes (Signed)
Emergency Department Provider Note   I have reviewed the triage vital signs and the nursing notes.   HISTORY  Chief Complaint Ankle Pain   HPI Cathy Wells is a 29 y.o. female past history reviewed below presents to the emergency department after rolling the left ankle with pain and swelling.  Patient was walking down the stairs when she missed a step.  She rolled the ankle but was able to keep her self from falling by catching herself.  She had severe pain and heard a pop.  Pain has worsened and she has some tingling type sensation in the ankle.  Denies numbness.  No pain in the knee.  She has seen Dr. Romeo Apple in the past for orthopedic issues.   Past Medical History:  Diagnosis Date  . Complication of anesthesia    PONV  . Frequency of urination   . GERD (gastroesophageal reflux disease)   . Hematuria   . Left ureteral Prisk   . Mild asthma   . Ovarian cyst   . Urgency of urination   . Wears contact lenses     Patient Active Problem List   Diagnosis Date Noted  . Irregular periods 12/14/2016  . RLQ abdominal pain 11/30/2016  . Missed periods 11/30/2016  . Right ovarian cyst 11/30/2016  . Urinary frequency 05/27/2015  . Pelvic pain in female 05/27/2015  . UTI (lower urinary tract infection) 04/28/2015    Past Surgical History:  Procedure Laterality Date  . APPENDECTOMY  07/25/2006   OPEN  . URETEROSCOPY WITH HOLMIUM LASER LITHOTRIPSY Left 07/21/2017   Procedure: retrogrades, URETEROSCOPY WITH Bienaime basketry, stent placement;  Surgeon: Crist Fat, MD;  Location: [facility];  Service: Urology;  Laterality: Left;    Allergies Doxycycline, Amoxapine and related, Azithromycin, Clarithromycin, Erythromycin, Morphine and related, Penicillins, and Sulfa antibiotics  Family History  Problem Relation Age of Onset  . Hypertension Mother   . Asthma Father   . Hypertension Father   . Hypertension Maternal Grandfather   . Diabetes Paternal  Grandmother   . Hypertension Paternal Grandmother   . COPD Paternal Grandfather     Social History Social History   Tobacco Use  . Smoking status: Never Smoker  . Smokeless tobacco: Never Used  Vaping Use  . Vaping Use: Never used  Substance Use Topics  . Alcohol use: No  . Drug use: No    Review of Systems  Constitutional: No fever/chills Musculoskeletal: Positive left ankle pain and swelling.  Skin: Negative for rash. Neurological: Negative for headaches, focal weakness or numbness.   ____________________________________________   PHYSICAL EXAM:  VITAL SIGNS: ED Triage Vitals [05/24/20 2205]  Enc Vitals Group     BP (!) 150/119     Pulse Rate 105     Resp 18     Temp 98 F (36.7 C)     Temp Source Oral     SpO2 100 %     Weight (!) 240 lb (108.9 kg)     Height 5\' 2"  (1.575 m)   Constitutional: Alert and oriented. Well appearing and in no acute distress. Eyes: Conjunctivae are normal Head: Atraumatic. Nose: No congestion/rhinnorhea. Mouth/Throat: Mucous membranes are moist.   Neck: No stridor.   Cardiovascular: Normal rate, regular rhythm. Good peripheral circulation. Grossly normal heart sounds.  Palpable DP and PT pulses in the left foot.  Respiratory: Normal respiratory effort.   Gastrointestinal: No distention.  Musculoskeletal: Tenderness and swelling over the left ankle mainly over  the lateral aspect.  No proximal fibular tenderness. Neurologic:  Normal speech and language.  Normal sensation in the left foot. Skin:  Skin is warm, dry and intact. No rash noted.  ____________________________________________  RADIOLOGY  DG Ankle Complete Left  Result Date: 05/24/2020 CLINICAL DATA:  Pain EXAM: LEFT ANKLE COMPLETE - 3+ VIEW COMPARISON:  07/06/2016 FINDINGS: There is extensive soft tissue swelling about the lateral malleolus. There is a 3 mm osseous fragment adjacent to the distal aspect of the lateral malleolus. There is no dislocation. There is a  moderate-sized plantar calcaneal spur. There is a joint effusion. IMPRESSION: 1. Extensive soft tissue swelling about the lateral malleolus. 2. A 3 mm osseous fragment adjacent to the distal aspect of the lateral malleolus may represent an avulsion injury. Electronically Signed   By: Katherine Mantle M.D.   On: 05/24/2020 23:09    ____________________________________________   PROCEDURES  Procedure(s) performed:   Procedures  None  ____________________________________________   INITIAL IMPRESSION / ASSESSMENT AND PLAN / ED COURSE  Pertinent labs & imaging results that were available during my care of the patient were reviewed by me and considered in my medical decision making (see chart for details).   Patient presents emergency department for evaluation of left ankle pain and swelling after rolling the ankle on the stairs.  She has soft tissue swelling over the lateral malleolus with questionable avulsion injury.  Will place in a cam walker boot and provide crutches.  Discussed RICE at home and outpatient follow-up with Dr. Romeo Apple.    ____________________________________________  FINAL CLINICAL IMPRESSION(S) / ED DIAGNOSES  Final diagnoses:  Sprain of left ankle, unspecified ligament, initial encounter     MEDICATIONS GIVEN DURING THIS VISIT:  Medications  HYDROcodone-acetaminophen (NORCO/VICODIN) 5-325 MG per tablet 1 tablet (has no administration in time range)     NEW OUTPATIENT MEDICATIONS STARTED DURING THIS VISIT:  New Prescriptions   IBUPROFEN (ADVIL) 800 MG TABLET    Take 1 tablet (800 mg total) by mouth every 8 (eight) hours as needed for moderate pain.    Note:  This document was prepared using Dragon voice recognition software and may include unintentional dictation errors.  Alona Bene, MD, [facility] Emergency Medicine    Denvil Canning, Arlyss Repress, MD 05/24/20 [phone]

## 2020-05-24 NOTE — ED Triage Notes (Signed)
Pt states she was going down stairs and lost her footing. Pt with swelling and pain to left ankle.

## 2020-10-11 ENCOUNTER — Emergency Department (HOSPITAL_COMMUNITY): Payer: PRIVATE HEALTH INSURANCE

## 2020-10-11 ENCOUNTER — Other Ambulatory Visit: Payer: Self-pay

## 2020-10-11 ENCOUNTER — Observation Stay (HOSPITAL_COMMUNITY)
Admission: EM | Admit: 2020-10-11 | Discharge: 2020-10-12 | Disposition: A | Discharge status: Home / Self Care | Payer: PRIVATE HEALTH INSURANCE | Attending: Surgery | Admitting: Surgery

## 2020-10-11 ENCOUNTER — Encounter (HOSPITAL_COMMUNITY): Payer: Self-pay | Admitting: *Deleted

## 2020-10-11 DIAGNOSIS — J45909 Unspecified asthma, uncomplicated: Secondary | ICD-10-CM | POA: Diagnosis not present

## 2020-10-11 DIAGNOSIS — Z20822 Contact with and (suspected) exposure to covid-19: Secondary | ICD-10-CM | POA: Diagnosis not present

## 2020-10-11 DIAGNOSIS — S1191XA Laceration without foreign body of unspecified part of neck, initial encounter: Secondary | ICD-10-CM | POA: Diagnosis present

## 2020-10-11 DIAGNOSIS — S61402A Unspecified open wound of left hand, initial encounter: Secondary | ICD-10-CM | POA: Insufficient documentation

## 2020-10-11 DIAGNOSIS — W540XXA Bitten by dog, initial encounter: Secondary | ICD-10-CM | POA: Diagnosis not present

## 2020-10-11 DIAGNOSIS — Z23 Encounter for immunization: Secondary | ICD-10-CM | POA: Insufficient documentation

## 2020-10-11 DIAGNOSIS — S162XXA Laceration of muscle, fascia and tendon at neck level, initial encounter: Principal | ICD-10-CM | POA: Insufficient documentation

## 2020-10-11 DIAGNOSIS — S199XXA Unspecified injury of neck, initial encounter: Secondary | ICD-10-CM | POA: Diagnosis present

## 2020-10-11 DIAGNOSIS — S0191XA Laceration without foreign body of unspecified part of head, initial encounter: Secondary | ICD-10-CM | POA: Diagnosis present

## 2020-10-11 LAB — RESP PANEL BY RT-PCR (FLU A&B, COVID) ARPGX2
Influenza A by PCR: NEGATIVE
Influenza B by PCR: NEGATIVE
SARS Coronavirus 2 by RT PCR: NEGATIVE

## 2020-10-11 IMAGING — DX DG HAND COMPLETE 3+V*L*
3 series · 3 of 3 positions shown · non-contrast
Comparison: None.

CLINICAL DATA: Dog bite

EXAM:
LEFT HAND - COMPLETE 3+ VIEW

[hand pa]
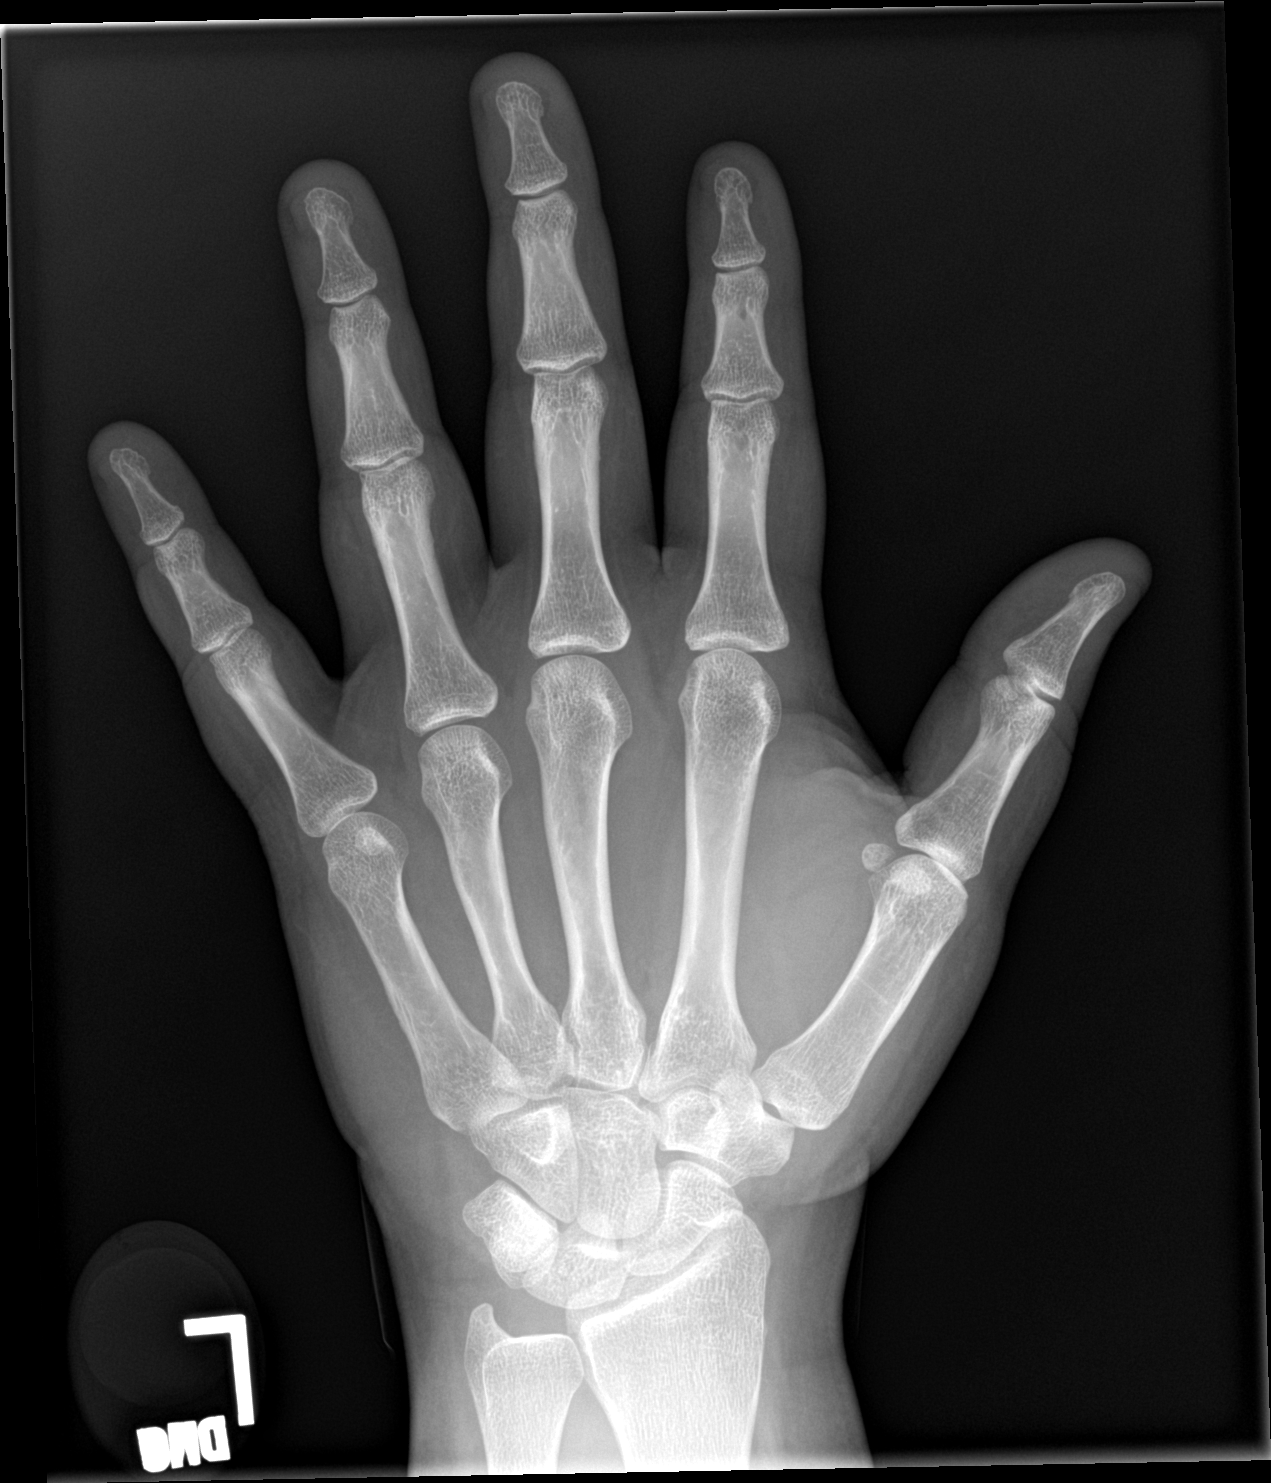

[hand obl]
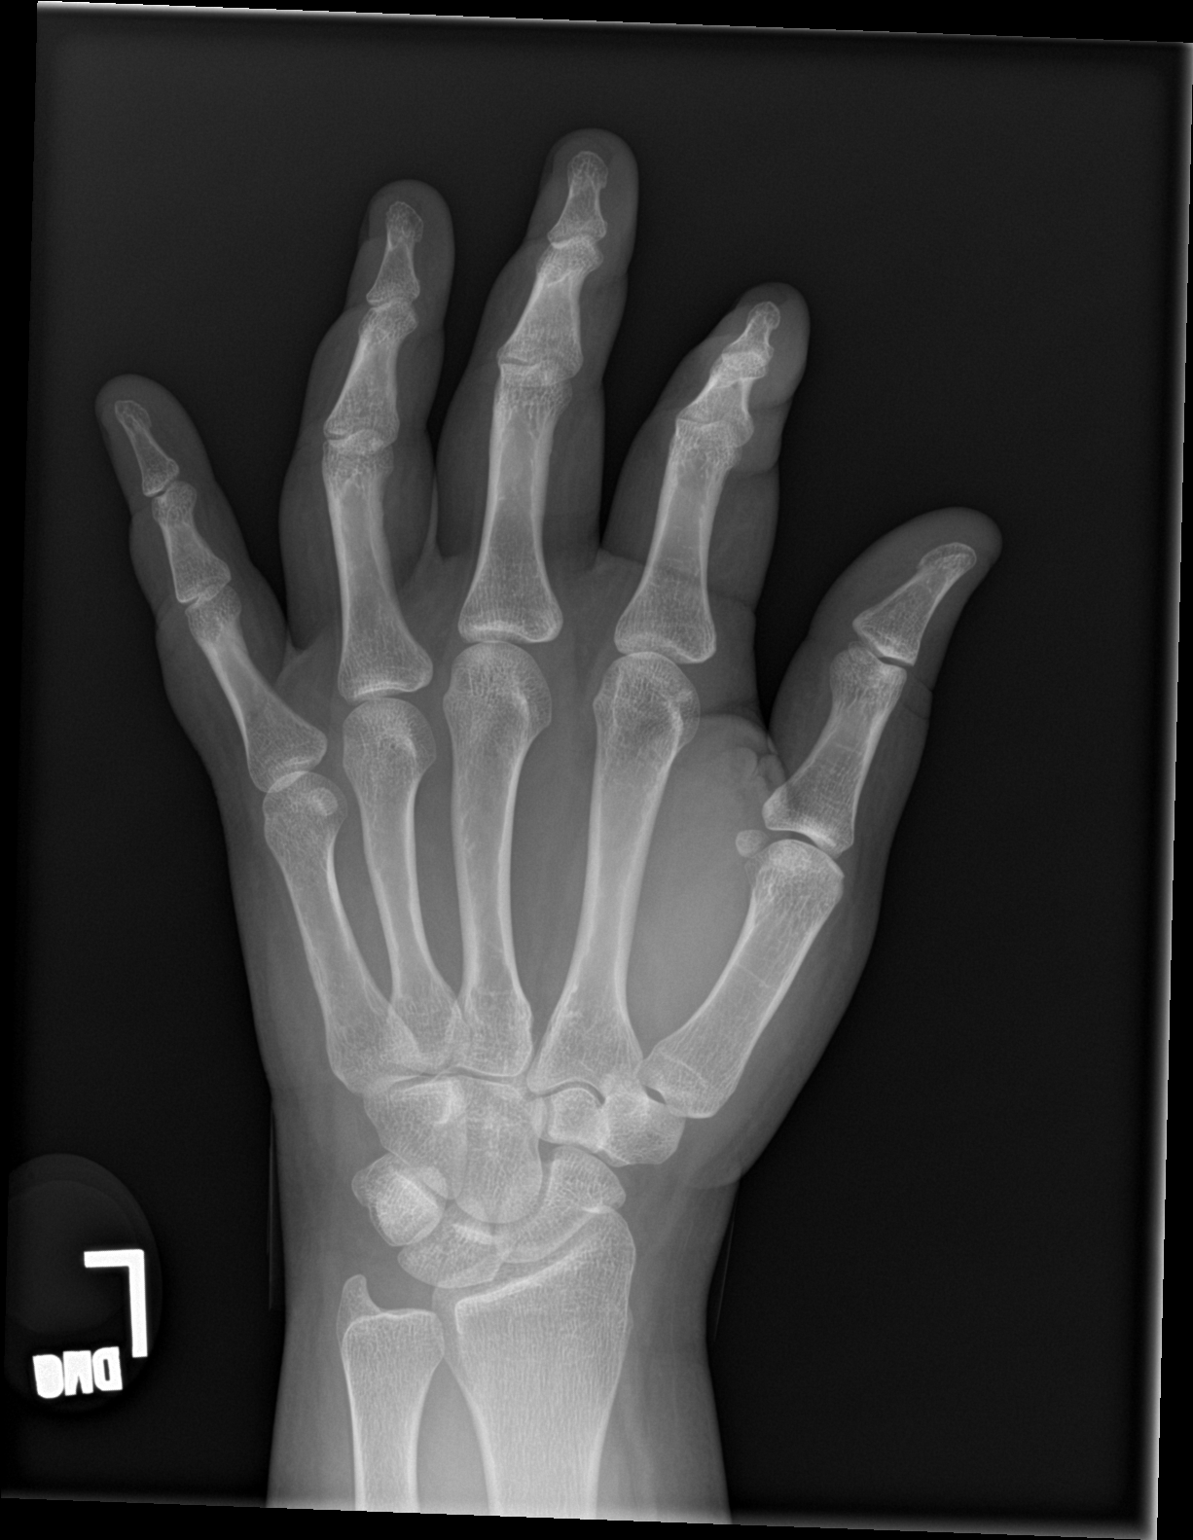

[hand lat]
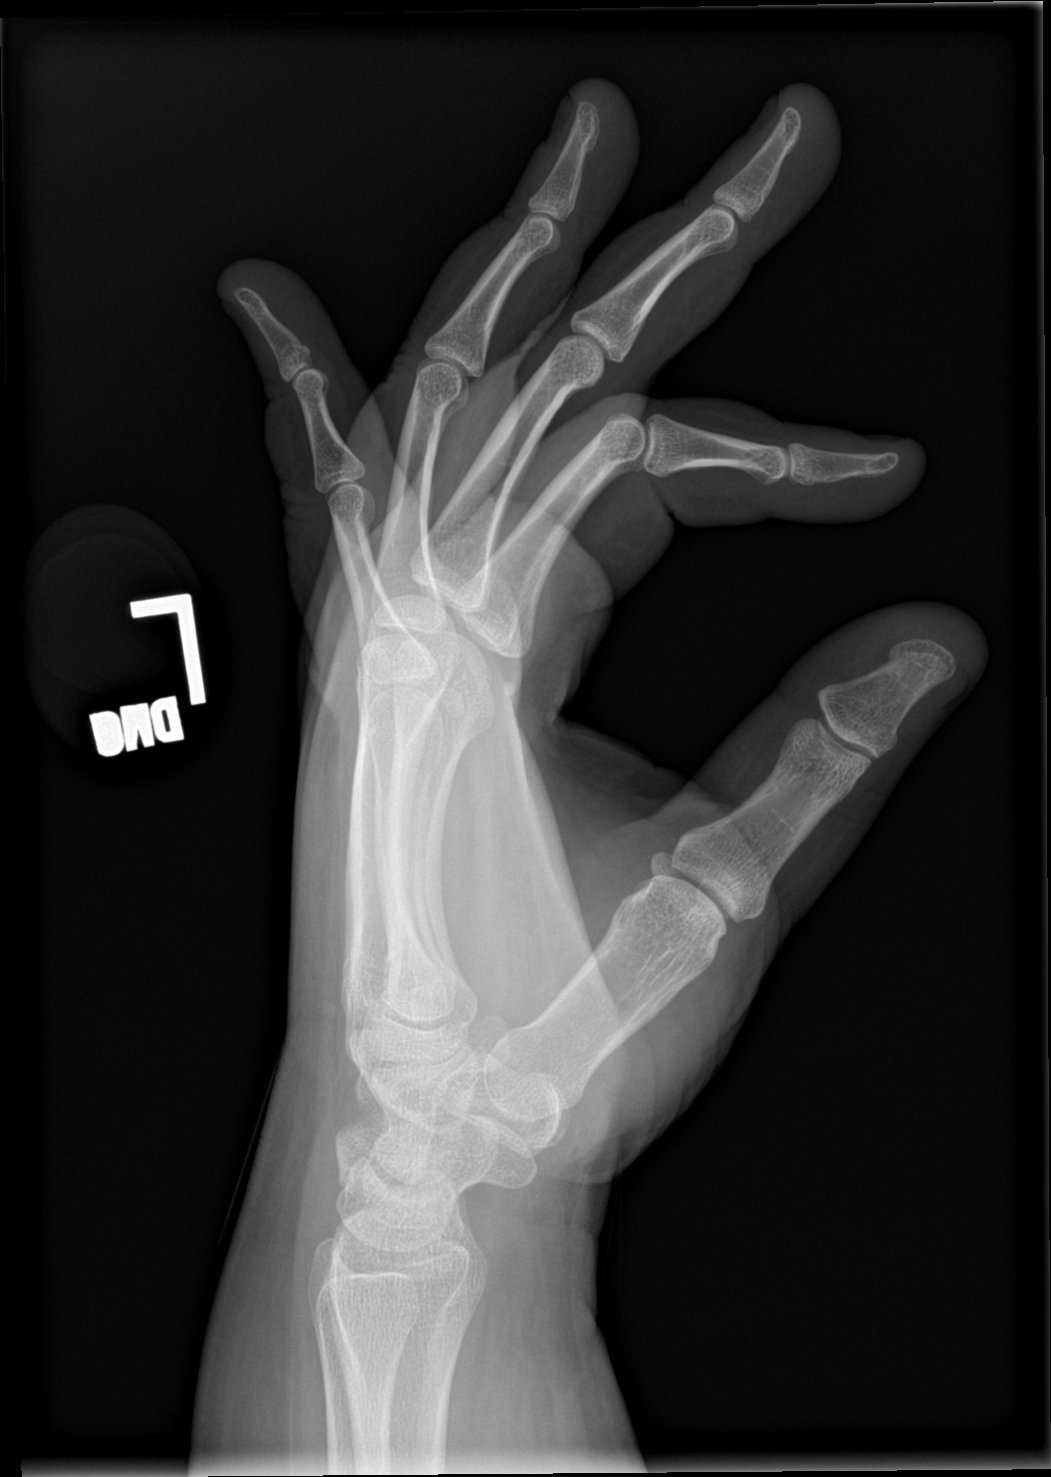

[3 of 3 positions shown; findings below may reference images not displayed]

FINDINGS: There is no evidence of fracture or dislocation. There is no
evidence of arthropathy or other focal bone abnormality. Soft
tissues are unremarkable. No radiopaque foreign body or soft tissue
gas.
IMPRESSION: Negative.

## 2020-10-11 IMAGING — CT CT ANGIO NECK
2 of 7 series · 8 of 33 positions shown · IV contrast (omnipaque)
Comparison: None.

CLINICAL DATA: 29-year-old female status post penetrating neck
trauma from dog.

EXAM:
CT ANGIOGRAPHY NECK
TECHNIQUE: Multidetector CT imaging of the neck was performed using the
standard protocol during bolus administration of intravenous
contrast. Multiplanar CT image reconstructions and MIPs were
obtained to evaluate the vascular anatomy. Carotid stenosis
measurements (when applicable) are obtained utilizing NASCET
criteria, using the distal internal carotid diameter as the
denominator.
CONTRAST:  75mL OMNIPAQUE IOHEXOL 350 MG/ML SOLN

[Series 4: cta neck · axial · 0.47mm/px · z∈[-158,-80]mm · 2 of 118 slices shown]
[im 40/118  soft-tissue]
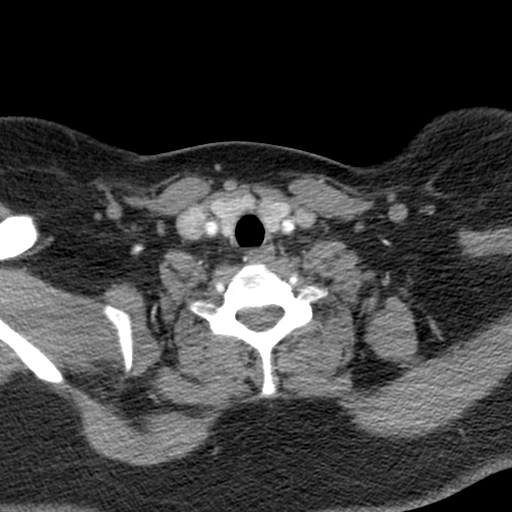
[im 79/118  soft-tissue]
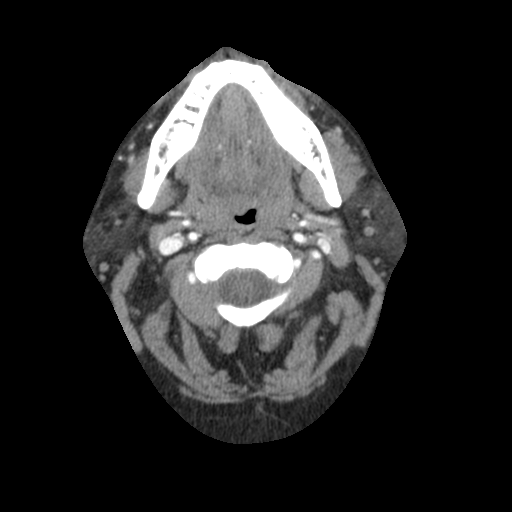

[Series 6: ax thin · axial · 0.42mm/px · z∈[-198,-33]mm · 6 of 233 slices shown]
[im 34/233  soft-tissue]
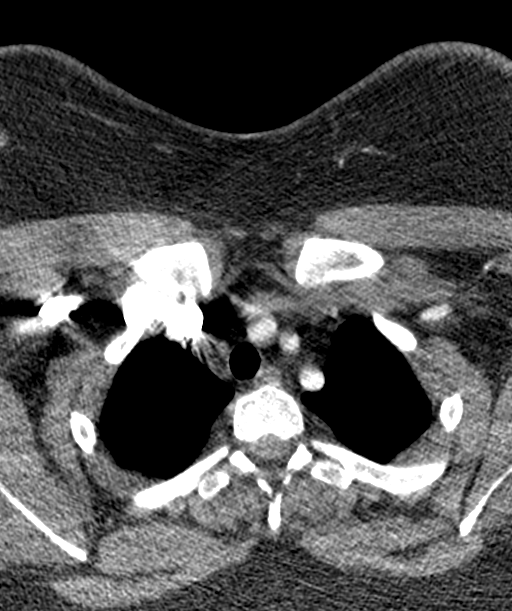
[im 67/233  bone]
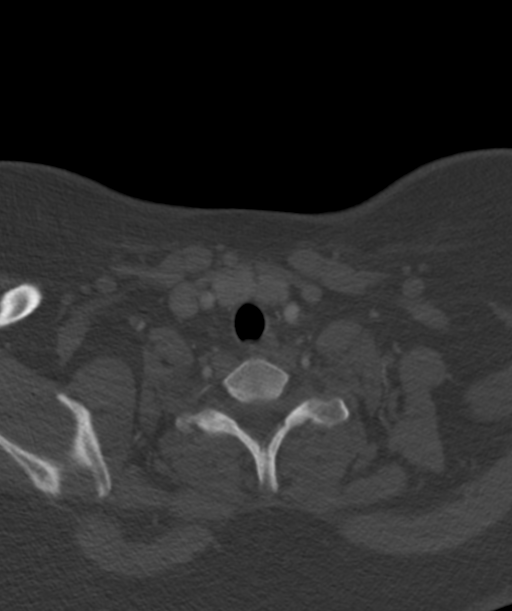
[im 100/233  soft-tissue]
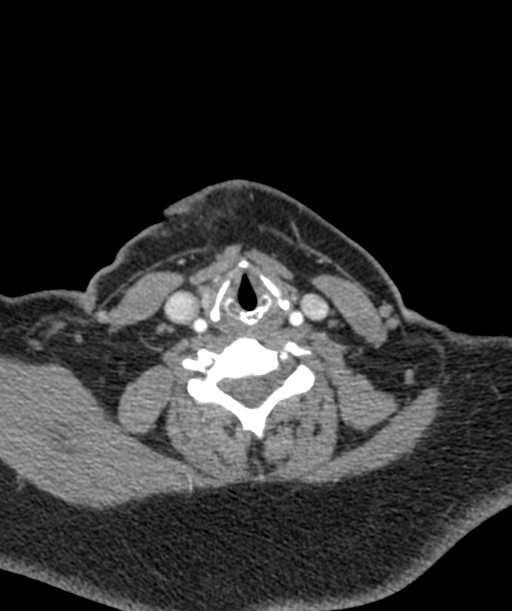
[im 133/233  bone]
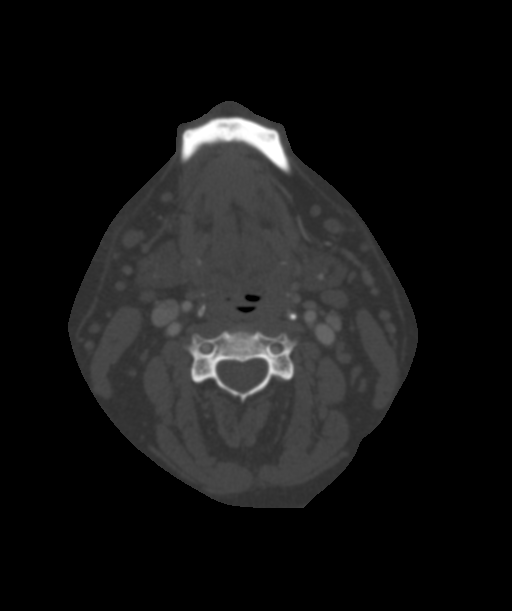
[im 166/233  soft-tissue]
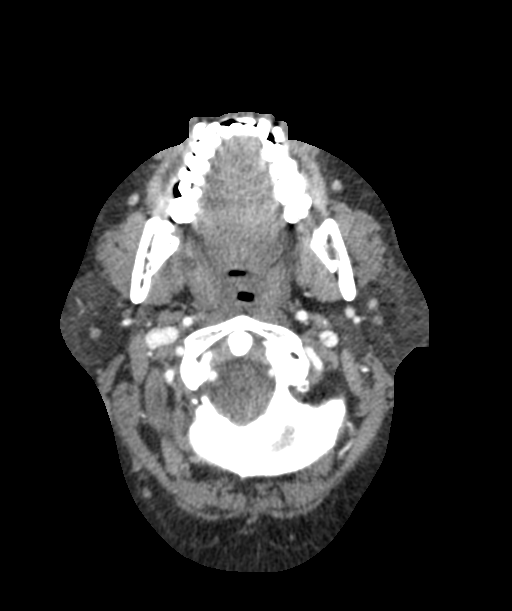
[im 199/233  bone]
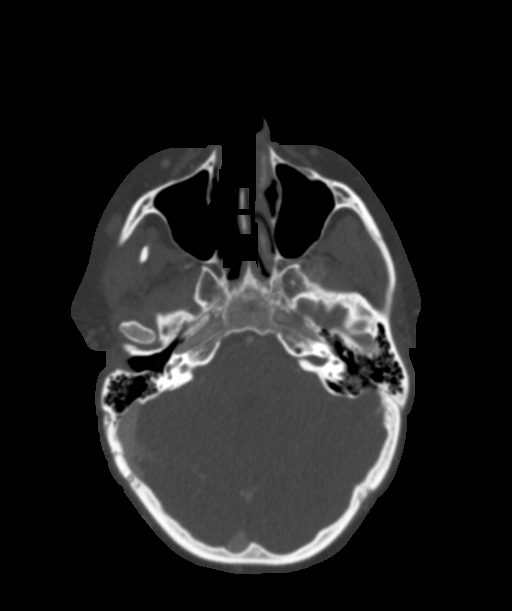

[8 of 33 positions shown; findings below may reference images not displayed]

FINDINGS: Skeleton: No osseous abnormality identified. Well aerated paranasal
sinuses, small right maxillary mucous retention cysts.

Upper chest: Negative.

Other neck: Thyroid, larynx, pharynx, parapharyngeal spaces,
retropharyngeal space, sublingual space, left submandibular space,
bilateral parotid spaces are within normal limits.

Soft tissue injury along the right lateral neck from the
submandibular space to the thyroid. Multiple small foci of
subcutaneous gas (series 4, image 56). Regional mild
hematoma/contusion. Thickening and indistinctness of the right
platysma. Minimal involvement of the superficial aspect of the right
submandibular space. The right submandibular gland remains normal.
No discrete hematoma.

Bilateral cervical lymph nodes remain within normal limits.

Negative visible brain parenchyma, orbits.

Aortic arch: 3 vessel arch configuration.  No arch atherosclerosis.

Right carotid system: Negative. Negative visible right ICA siphon,
right anterior circulation.

Left carotid system: Negative. Negative visible left ICA siphon,
left anterior circulation.

Vertebral arteries:
Normal proximal right subclavian artery and right vertebral artery
origin. The right vertebral artery is patent and normal to the
vertebrobasilar junction. Normal right PICA origin.

Negative visible basilar artery, posterior circulation.

Proximal left subclavian artery and left vertebral artery origin are
normal. Codominant left vertebral artery is mildly tortuous but
patent and normal to the vertebrobasilar junction. Normal left PICA.

Other findings: No contrast extravasation is identified in the right
neck region of soft tissue injury. Major venous structures in the
neck appear patent, the right IJ is somewhat dominant.

Review of the MIP images confirms the above findings
IMPRESSION: 1. Soft tissue injury along the right lateral neck from just below
the mandible to the thyroid, rarely extending deep to the platysma.
No contrast extravasation. No discrete hematoma.

2. No arterial injury or abnormality.

## 2020-10-11 MED ORDER — METRONIDAZOLE IN NACL 5-0.79 MG/ML-% IV SOLN
500.0000 mg | Freq: Three times a day (TID) | INTRAVENOUS | Status: DC
  Administered 2020-10-12 (×2): 500 mg via INTRAVENOUS
  Filled 2020-10-11 (×2): qty 100

## 2020-10-11 MED ORDER — LIDOCAINE-EPINEPHRINE (PF) 1 %-1:200000 IJ SOLN
10.0000 mL | Freq: Once | INTRAMUSCULAR | Status: AC
  Administered 2020-10-11: 10 mL via INTRADERMAL
  Filled 2020-10-11: qty 30

## 2020-10-11 MED ORDER — IOHEXOL 350 MG/ML SOLN
75.0000 mL | Freq: Once | INTRAVENOUS | Status: AC | PRN
  Administered 2020-10-11: 21:00:00 75 mL via INTRAVENOUS

## 2020-10-11 MED ORDER — ACETAMINOPHEN 325 MG PO TABS
650.0000 mg | ORAL_TABLET | Freq: Four times a day (QID) | ORAL | Status: DC
  Administered 2020-10-12 (×3): 650 mg via ORAL
  Filled 2020-10-11 (×3): qty 2

## 2020-10-11 MED ORDER — CLINDAMYCIN PHOSPHATE 600 MG/50ML IV SOLN
600.0000 mg | Freq: Once | INTRAVENOUS | Status: AC
  Administered 2020-10-11: 22:00:00 600 mg via INTRAVENOUS
  Filled 2020-10-11: qty 50

## 2020-10-11 MED ORDER — TETANUS-DIPHTH-ACELL PERTUSSIS 5-2.5-18.5 LF-MCG/0.5 IM SUSY
0.5000 mL | PREFILLED_SYRINGE | Freq: Once | INTRAMUSCULAR | Status: AC
  Administered 2020-10-11: 20:00:00 0.5 mL via INTRAMUSCULAR
  Filled 2020-10-11: qty 0.5

## 2020-10-11 MED ORDER — FENTANYL CITRATE (PF) 100 MCG/2ML IJ SOLN
50.0000 ug | INTRAMUSCULAR | Status: DC | PRN
  Administered 2020-10-11 – 2020-10-12 (×3): 50 ug via INTRAVENOUS
  Filled 2020-10-11 (×4): qty 2

## 2020-10-11 MED ORDER — CIPROFLOXACIN IN D5W 400 MG/200ML IV SOLN
400.0000 mg | Freq: Two times a day (BID) | INTRAVENOUS | Status: DC
  Administered 2020-10-12 (×2): 400 mg via INTRAVENOUS
  Filled 2020-10-11 (×2): qty 200

## 2020-10-11 MED ORDER — ENOXAPARIN SODIUM 30 MG/0.3ML ~~LOC~~ SOLN
30.0000 mg | Freq: Two times a day (BID) | SUBCUTANEOUS | Status: DC
  Filled 2020-10-11: qty 0.3

## 2020-10-11 MED ORDER — ONDANSETRON 4 MG PO TBDP: 4.0000 mg | ORAL_TABLET | Freq: Four times a day (QID) | ORAL | Status: DC | PRN

## 2020-10-11 MED ORDER — LIDOCAINE HCL (PF) 1 % IJ SOLN
5.0000 mL | Freq: Once | INTRAMUSCULAR | Status: AC
  Administered 2020-10-12: 01:00:00 5 mL via INTRADERMAL
  Filled 2020-10-11: qty 5

## 2020-10-11 MED ORDER — ONDANSETRON HCL 4 MG/2ML IJ SOLN
4.0000 mg | Freq: Four times a day (QID) | INTRAMUSCULAR | Status: DC | PRN
  Administered 2020-10-12: 14:00:00 4 mg via INTRAVENOUS
  Filled 2020-10-11: qty 2

## 2020-10-11 NOTE — ED Notes (Signed)
Pt is to be transferred to [facility] and paperwork started

## 2020-10-11 NOTE — ED Provider Notes (Addendum)
11:18 PM  Patient sent here from [facility] ED after a dog bite to the neck and left hand.  CTA showed no vascular injury rarely extending deep to the new PICC placement.  She has received a tetanus vaccination and clindamycin.  No active bleeding.  No sign of expanding hematoma.  No hypoxia, difficulty speaking or breathing.  Trachea midline.  Trauma surgery consulted by ED physician at [facility] and recommended transfer her to [facility] for observation admission.  11:24 PM  Spoke with Dr. Dossie Der with trauma who will see in ED.  I reviewed all nursing notes and pertinent previous records as available.  I have reviewed and interpreted any EKGs, lab and urine results, imaging (as available).    Janeah Kovacich, Layla Maw, DO 10/11/20 2325   12:35 AM  Pt seen by trauma service who recommends that I close the large, deeper neck laceration superficially with 2-3 sutures.  She has 2 other small lacerations that are superficial that we have left open.  The larger neck laceration was approximately 5 cm and was irrigated copiously with a liter of saline.  Approximated using 3 superficial sutures.  Patient on antibiotics.  LACERATION REPAIR Performed by: Rochele Raring Authorized by: Rochele Raring Consent: Verbal consent obtained. Risks and benefits: risks, benefits and alternatives were discussed Consent given by: patient Patient identity confirmed: provided demographic data Prepped and Draped in normal sterile fashion Wound explored  Laceration Location: neck  Laceration Length: 5 cm  No Foreign Bodies seen or palpated  Anesthesia: local infiltration  Local anesthetic: lidocaine 1% without epinephrine  Anesthetic total: 5 ml  Irrigation method: Bottle of saline Amount of cleaning: Extensive  Skin closure: Superficial  Number of sutures: 3  Technique: Area anesthetized using lidocaine 1% without epinephrine. Wound irrigated copiously with sterile saline. Wound then cleaned with Betadine  and draped in sterile fashion. Wound closed using 3 simple interrupted sutures with 3-0 Prolene.  Bacitracin and sterile nonadherent dressing applied. Good wound approximation and hemostasis achieved.   Patient tolerance: Patient tolerated the procedure well with no immediate complications.    Wallice Granville, Layla Maw, DO 10/12/20 [phone]

## 2020-10-11 NOTE — ED Notes (Signed)
MD at bedside. 

## 2020-10-11 NOTE — ED Notes (Signed)
ED provider at bedside.

## 2020-10-11 NOTE — ED Triage Notes (Addendum)
Patient transferred here from [facility] to be evaluated by trauma surgeon due to dog bite to anterior neck with possible platysma involvement. Patient is AAOx4, respirations even and nonlabored, patient ambulated to and from restroom upon arrival with a steady gait. NAD noted. Dressing is dry and intact.

## 2020-10-11 NOTE — ED Notes (Signed)
Call to [facility] to give report to ED CN  Report to Thayer Ohm, RN, CN

## 2020-10-11 NOTE — ED Notes (Signed)
Pt reports with spouses family  Husband laughed so hard he lost his breath and appeared to "pass out"  Pt screamed when uncle's dog jumped up and bit her in the throat and hand   Open tear to lower anterior throat  Pt is clear voiced and has n o trouble breathing   Small puncture to L posterior little finger  Bleeding controlled

## 2020-10-11 NOTE — ED Notes (Signed)
Pt reports that Dr Hyacinth Meeker told her that her husband could stay with her   This RN suggested that she tell next providers as the visitation may be different

## 2020-10-11 NOTE — ED Notes (Signed)
Pt reports animal control has been to home

## 2020-10-11 NOTE — ED Triage Notes (Signed)
Pt bit to right neck and left hand, small puncture to left pinky finger.  Pt with lacerations to right neck.  Per pt animal control has been called and spoke with them.  Per pt, dog was up to date with shots.

## 2020-10-11 NOTE — ED Provider Notes (Addendum)
[facility]GENCY DEPARTMENT Provider Note   CSN: [id number] Arrival date & time: 10/11/20  1930     History Chief Complaint  Patient presents with  . Animal Bite    Cathy Wells is a 29 y.o. female.  HPI   This patient is a 29 year old female who unfortunately suffered a dog bite to her neck and her left hand that occurred just prior to arrival.  This is a dog that is known to the family, it is up-to-date on vaccinations per the family members.  Evidently the patient had screamed for help and her husband had a syncopal episode prompted the dog jumped up and bite her on the neck.  It was hanging off of her neck tearing her skin, a family member was able to pry it loose.  The patient comes in after having a significant mount of bleeding at home which she states was on the floor but there is no active bleeding at this time.  She has some neck discomfort but no difficulty breathing or swallowing or speaking.  She is not on any blood thinners.  There is also a small abrasion over the left small finger of the hand.  Past Medical History:  Diagnosis Date  . Complication of anesthesia    PONV  . Frequency of urination   . GERD (gastroesophageal reflux disease)   . Hematuria   . Left ureteral Knapper   . Mild asthma   . Ovarian cyst   . Urgency of urination   . Wears contact lenses     Patient Active Problem List   Diagnosis Date Noted  . Irregular periods 12/14/2016  . RLQ abdominal pain 11/30/2016  . Missed periods 11/30/2016  . Right ovarian cyst 11/30/2016  . Urinary frequency 05/27/2015  . Pelvic pain in female 05/27/2015  . UTI (lower urinary tract infection) 04/28/2015    Past Surgical History:  Procedure Laterality Date  . APPENDECTOMY  07/25/2006   OPEN  . URETEROSCOPY WITH HOLMIUM LASER LITHOTRIPSY Left 07/21/2017   Procedure: retrogrades, URETEROSCOPY WITH Rabideau basketry, stent placement;  Surgeon: Crist FatHerrick, Benjamin W, MD;  [facility];  Service: Urology;  Laterality: Left;     OB History    Gravida  0   Para  0   Term  0   Preterm  0   AB  0   Living  0     SAB  0   IAB  0   Ectopic  0   Multiple  0   Live Births  0           Family History  Problem Relation Age of Onset  . Hypertension Mother   . Asthma Father   . Hypertension Father   . Hypertension Maternal Grandfather   . Diabetes Paternal Grandmother   . Hypertension Paternal Grandmother   . COPD Paternal Grandfather     Social History   Tobacco Use  . Smoking status: Never Smoker  . Smokeless tobacco: Never Used  Vaping Use  . Vaping Use: Never used  Substance Use Topics  . Alcohol use: No  . Drug use: No    Home Medications Prior to Admission medications   Medication Sig Start Date End Date Taking? Authorizing Provider  acetaminophen (TYLENOL) 500 MG tablet Take 500 mg by mouth as needed.    [provider]  albuterol (PROVENTIL HFA;VENTOLIN HFA) 108 (90 Base) MCG/ACT inhaler Inhale 1-2 puffs into the lungs every 6 (six)  hours as needed for wheezing or shortness of breath.    [provider]  ALPRAZolam Prudy Feeler) 0.5 MG tablet Take 0.5 mg by mouth 2 (two) times daily as needed. 10/31/19   [provider]  clindamycin (CLEOCIN) 150 MG capsule Take 1 capsule (150 mg total) by mouth every 6 (six) hours. 02/22/20   Avegno, Zachery Dakins, FNP  escitalopram (LEXAPRO) 20 MG tablet  05/30/18   [provider]  HYDROcodone-acetaminophen (NORCO/VICODIN) 5-325 MG tablet One tablet every four hours as needed for acute pain.  Limit of five days per [place]. 06/15/18   Darreld Mclean, MD  ibuprofen (ADVIL) 800 MG tablet Take 1 tablet (800 mg total) by mouth every 8 (eight) hours as needed for moderate pain. 05/24/20   Long, Arlyss Repress, MD  olmesartan-hydrochlorothiazide (BENICAR HCT) 40-25 MG tablet Take 1 tablet by mouth daily. 10/08/19   [provider]  pantoprazole (PROTONIX) 40 MG  tablet Take 40 mg by mouth daily as needed.     [provider]  predniSONE (DELTASONE) 10 MG tablet Take 2 tablets (20 mg total) by mouth daily. 02/22/20   Durward Parcel, FNP    Allergies    Doxycycline, Amoxapine and related, Azithromycin, Clarithromycin, Erythromycin, Morphine and related, Penicillins, and Sulfa antibiotics  Review of Systems   Review of Systems  Constitutional: Negative for chills and fever.  HENT: Negative for sore throat.   Eyes: Negative for visual disturbance.  Respiratory: Negative for cough and shortness of breath.   Cardiovascular: Negative for chest pain.  Gastrointestinal: Negative for abdominal pain, diarrhea, nausea and vomiting.  Genitourinary: Negative for dysuria and frequency.  Musculoskeletal: Positive for neck pain. Negative for back pain.  Skin: Positive for wound. Negative for rash.  Neurological: Negative for weakness, numbness and headaches.  Hematological: Negative for adenopathy.  Psychiatric/Behavioral: Negative for behavioral problems.    Physical Exam Updated Vital Signs BP (!) 175/128 (BP Location: Left Arm)   Pulse (!) 106   Temp 98.6 F (37 C) (Oral)   Resp 20   Ht 1.575 m (5\' 2" )   Wt 101.6 kg   LMP 10/11/2020   SpO2 100%   BMI 40.97 kg/m   Physical Exam Vitals and nursing note reviewed.  Constitutional:      General: She is not in acute distress.    Appearance: She is well-developed and well-nourished.  HENT:     Head: Normocephalic and atraumatic.     Comments: No trauma to head - see neck exam    Mouth/Throat:     Mouth: Oropharynx is clear and moist.     Pharynx: No oropharyngeal exudate.  Eyes:     General: No scleral icterus.       Right eye: No discharge.        Left eye: No discharge.     Extraocular Movements: EOM normal.     Conjunctiva/sclera: Conjunctivae normal.     Pupils: Pupils are equal, round, and reactive to light.  Neck:     Thyroid: No thyromegaly.     Vascular: No JVD.      Comments: A couple of small lacerations and a larger open wound are present on the right anterolateral neck, these wounds were generally explored and there is no signs of deep tissue injury nor is there signs of active bleeding or vascular injury, no expanding hematoma Cardiovascular:     Rate and Rhythm: Normal rate and regular rhythm.     Pulses: Intact distal pulses.  Heart sounds: Normal heart sounds. No murmur heard. No friction rub. No gallop.   Pulmonary:     Effort: Pulmonary effort is normal. No respiratory distress.     Breath sounds: Normal breath sounds. No wheezing or rales.  Abdominal:     General: Bowel sounds are normal. There is no distension.     Palpations: Abdomen is soft. There is no mass.     Tenderness: There is no abdominal tenderness.  Musculoskeletal:        General: No tenderness or edema. Normal range of motion.     Cervical back: Normal range of motion and neck supple.  Lymphadenopathy:     Cervical: No cervical adenopathy.  Skin:    General: Skin is warm and dry.     Findings: No erythema or rash.     Comments: Wounds as noted to the right neck as well as a couple of small superficial abrasions to the left hand, normal range of motion of all the fingers of the hand  Neurological:     Mental Status: She is alert.     Coordination: Coordination normal.     Comments: Awake alert and has no obvious visible deficits, cranial nerves III through XII are normal,, able to follow commands  Psychiatric:        Mood and Affect: Mood and affect normal.        Behavior: Behavior normal.     ED Results / Procedures / Treatments   Labs (all labs ordered are listed, but only abnormal results are displayed) Labs Reviewed - No data to display  EKG None  Radiology CT Angio Neck W and/or Wo Contrast  Result Date: 10/11/2020 CLINICAL DATA:  29 year old female status post penetrating neck trauma from dog. EXAM: CT ANGIOGRAPHY NECK TECHNIQUE: Multidetector CT  imaging of the neck was performed using the standard protocol during bolus administration of intravenous contrast. Multiplanar CT image reconstructions and MIPs were obtained to evaluate the vascular anatomy. Carotid stenosis measurements (when applicable) are obtained utilizing NASCET criteria, using the distal internal carotid diameter as the denominator. CONTRAST:  48mL OMNIPAQUE IOHEXOL 350 MG/ML SOLN COMPARISON:  None. FINDINGS: Skeleton: No osseous abnormality identified. Well aerated paranasal sinuses, small right maxillary mucous retention cysts. Upper chest: Negative. Other neck: Thyroid, larynx, pharynx, parapharyngeal spaces, retropharyngeal space, sublingual space, left submandibular space, bilateral parotid spaces are within normal limits. Soft tissue injury along the right lateral neck from the submandibular space to the thyroid. Multiple small foci of subcutaneous gas (series 4, image 56). Regional mild hematoma/contusion. Thickening and indistinctness of the right platysma. Minimal involvement of the superficial aspect of the right submandibular space. The right submandibular gland remains normal. No discrete hematoma. Bilateral cervical lymph nodes remain within normal limits. Negative visible brain parenchyma, orbits. Aortic arch: 3 vessel arch configuration.  No arch atherosclerosis. Right carotid system: Negative. Negative visible right ICA siphon, right anterior circulation. Left carotid system: Negative. Negative visible left ICA siphon, left anterior circulation. Vertebral arteries: Normal proximal right subclavian artery and right vertebral artery origin. The right vertebral artery is patent and normal to the vertebrobasilar junction. Normal right PICA origin. Negative visible basilar artery, posterior circulation. Proximal left subclavian artery and left vertebral artery origin are normal. Codominant left vertebral artery is mildly tortuous but patent and normal to the vertebrobasilar  junction. Normal left PICA. Other findings: No contrast extravasation is identified in the right neck region of soft tissue injury. Major venous structures in the neck appear patent, the  right IJ is somewhat dominant. Review of the MIP images confirms the above findings IMPRESSION: 1. Soft tissue injury along the right lateral neck from just below the mandible to the thyroid, rarely extending deep to the platysma. No contrast extravasation. No discrete hematoma. 2. No arterial injury or abnormality. Electronically Signed   By: Odessa Fleming M.D.   On: 10/11/2020 21:08   DG Hand Complete Left  Result Date: 10/11/2020 CLINICAL DATA:  Dog bite EXAM: LEFT HAND - COMPLETE 3+ VIEW COMPARISON:  None. FINDINGS: There is no evidence of fracture or dislocation. There is no evidence of arthropathy or other focal bone abnormality. Soft tissues are unremarkable. No radiopaque foreign body or soft tissue gas. IMPRESSION: Negative. Electronically Signed   By: Charlett Nose M.D.   On: 10/11/2020 21:11    Procedures Procedures (including critical care time)  Medications Ordered in ED Medications  clindamycin (CLEOCIN) IVPB 600 mg (has no administration in time range)  fentaNYL (SUBLIMAZE) injection 50 mcg (has no administration in time range)  Tdap (BOOSTRIX) injection 0.5 mL (0.5 mLs Intramuscular Given 10/11/20 2018)  lidocaine-EPINEPHrine (XYLOCAINE-EPINEPHrine) 1 %-1:200000 (PF) injection 10 mL (10 mLs Intradermal Given 10/11/20 2018)  iohexol (OMNIPAQUE) 350 MG/ML injection 75 mL (75 mLs Intravenous Contrast Given 10/11/20 2054)    ED Course  I have reviewed the triage vital signs and the nursing notes.  Pertinent labs & imaging results that were available during my care of the patient were reviewed by me and considered in my medical decision making (see chart for details).  Clinical Course as of 10/11/20 2149  Sat Oct 11, 2020  2145 Discussed with Dr. Dossie Der, who recommends that the patient be  observed on the trauma service overnight.  We will transfer the patient down to the emergency department, the wound will be left open and covered with a sterile dressing, she will be given antibiotics and pain medicine, the patient is agreeable. [BM]    Clinical Course User Index [BM] Eber Hong, MD   MDM Rules/Calculators/A&P                          Discussed with local general surgeon Dr. Lovell Sheehan who recommends a CT angiogram of the neck to look for vascular injury or deeper penetrating injury,        Care discussed with the trauma surgeon, the patient is agreeable, she will be given a sterile dressing, IV antibiotics, pain medications, she will be kept n.p.o. until this wound can be further explored, after discussion with the radiologist it appears that there is some violation of the platysma  Discussed with Dr. Silverio Lay in the emergency department at [facility], he has accepted the patient in transfer, trauma will be consulted to see and admit for observation when she arrives.  Final Clinical Impression(s) / ED Diagnoses Final diagnoses:  Laceration of fascia of neck  Dog bite, initial encounter      Eber Hong, MD 10/11/20 2149    Eber Hong, MD 10/11/20 2209

## 2020-10-11 NOTE — ED Notes (Signed)
Patient transported to CT 

## 2020-10-12 DIAGNOSIS — S1181XA Laceration without foreign body of other specified part of neck, initial encounter: Secondary | ICD-10-CM | POA: Diagnosis not present

## 2020-10-12 DIAGNOSIS — S162XXA Laceration of muscle, fascia and tendon at neck level, initial encounter: Secondary | ICD-10-CM | POA: Diagnosis not present

## 2020-10-12 DIAGNOSIS — S61412A Laceration without foreign body of left hand, initial encounter: Secondary | ICD-10-CM | POA: Diagnosis not present

## 2020-10-12 DIAGNOSIS — W540XXA Bitten by dog, initial encounter: Secondary | ICD-10-CM | POA: Diagnosis not present

## 2020-10-12 LAB — CBC WITH DIFFERENTIAL/PLATELET
Abs Immature Granulocytes: 0.03 10*3/uL (ref 0.00–0.07)
Basophils Absolute: 0.1 10*3/uL (ref 0.0–0.1)
Basophils Relative: 1 %
Eosinophils Absolute: 0.2 10*3/uL (ref 0.0–0.5)
Eosinophils Relative: 2 %
HCT: 42 % (ref 36.0–46.0)
Hemoglobin: 14.3 g/dL (ref 12.0–15.0)
Immature Granulocytes: 0 %
Lymphocytes Relative: 29 %
Lymphs Abs: 3.6 10*3/uL (ref 0.7–4.0)
MCH: 28.1 pg (ref 26.0–34.0)
MCHC: 34 g/dL (ref 30.0–36.0)
MCV: 82.7 fL (ref 80.0–100.0)
Monocytes Absolute: 1 10*3/uL (ref 0.1–1.0)
Monocytes Relative: 8 %
Neutro Abs: 7.4 10*3/uL (ref 1.7–7.7)
Neutrophils Relative %: 60 %
Platelets: 391 10*3/uL (ref 150–400)
RBC: 5.08 MIL/uL (ref 3.87–5.11)
RDW: 12.3 % (ref 11.5–15.5)
WBC: 12.4 10*3/uL — ABNORMAL HIGH (ref 4.0–10.5)
nRBC: 0 % (ref 0.0–0.2)

## 2020-10-12 LAB — BASIC METABOLIC PANEL
Anion gap: 11 (ref 5–15)
BUN: 9 mg/dL (ref 6–20)
CO2: 23 mmol/L (ref 22–32)
Calcium: 9.1 mg/dL (ref 8.9–10.3)
Chloride: 101 mmol/L (ref 98–111)
Creatinine, Ser: 0.85 mg/dL (ref 0.44–1.00)
GFR, Estimated: >60 mL/min (ref >60)
Glucose, Bld: 93 mg/dL (ref 70–99)
Potassium: 3.5 mmol/L (ref 3.5–5.1)
Sodium: 135 mmol/L (ref 135–145)

## 2020-10-12 LAB — HIV ANTIBODY (ROUTINE TESTING W REFLEX): HIV Screen 4th Generation wRfx: NONREACTIVE

## 2020-10-12 MED ORDER — BACITRACIN ZINC 500 UNIT/GM EX OINT: TOPICAL_OINTMENT | Freq: Two times a day (BID) | CUTANEOUS | 0 refills | Status: DC

## 2020-10-12 MED ORDER — ONDANSETRON 4 MG PO TBDP: 4.0000 mg | ORAL_TABLET | Freq: Four times a day (QID) | ORAL | 0 refills | Status: DC | PRN

## 2020-10-12 MED ORDER — OXYCODONE HCL 5 MG PO TABS
5.0000 mg | ORAL_TABLET | ORAL | Status: DC | PRN
Start: 2020-10-12 — End: 2020-10-12

## 2020-10-12 MED ORDER — METRONIDAZOLE 500 MG PO TABS: 500.0000 mg | ORAL_TABLET | Freq: Three times a day (TID) | ORAL | 0 refills | Status: AC

## 2020-10-12 MED ORDER — OXYCODONE HCL 5 MG PO TABS: 5.0000 mg | ORAL_TABLET | ORAL | 0 refills | Status: DC | PRN

## 2020-10-12 MED ORDER — CIPROFLOXACIN HCL 500 MG PO TABS: 500.0000 mg | ORAL_TABLET | Freq: Two times a day (BID) | ORAL | 0 refills | Status: AC

## 2020-10-12 MED ORDER — BACITRACIN ZINC 500 UNIT/GM EX OINT
TOPICAL_OINTMENT | Freq: Two times a day (BID) | CUTANEOUS | Status: DC
  Filled 2020-10-12: qty 28.4

## 2020-10-12 MED ORDER — MORPHINE SULFATE (PF) 4 MG/ML IV SOLN
4.0000 mg | Freq: Once | INTRAVENOUS | Status: AC
  Administered 2020-10-12: 01:00:00 4 mg via INTRAVENOUS
  Filled 2020-10-12: qty 1

## 2020-10-12 NOTE — ED Notes (Signed)
Attempted to draw blood from IV, unable to get proper blood return.

## 2020-10-12 NOTE — Discharge Instructions (Signed)
Animal Bite, Adult Animal bite wounds can be mild or serious. It is important to get medical treatment to prevent infection. Ask your doctor if you need treatment to prevent an infection that can spread from animals to humans (rabies). Follow these instructions at home: Wound care   Follow instructions from your doctor about how to take care of your wound. Make sure you: ? Wash your hands with soap and water before you change your bandage (dressing). If you cannot use soap and water, use hand sanitizer. ? Change your bandage as told by your doctor. ? Leave stitches (sutures), skin glue, or skin tape (adhesive) strips in place. They may need to stay in place for 2 weeks or longer. If tape strips get loose and curl up, you may trim the loose edges. Do not remove tape strips completely unless your doctor says it is okay.  Check your wound every day for signs of infection. Check for: ? More redness, swelling, or pain. ? More fluid or blood. ? Warmth. ? Pus or a bad smell. Medicines  Take or apply over-the-counter and prescription medicines only as told by your doctor.  If you were prescribed an antibiotic, take or apply it as told by your doctor. Do not stop using the antibiotic even if your wound gets better. General instructions   Keep the injured area raised (elevated) above the level of your heart while you are sitting or lying down.  If directed, put ice on the injured area. ? Put ice in a plastic bag. ? Place a towel between your skin and the bag. ? Leave the ice on for 20 minutes, 2-3 times per day.  Keep all follow-up visits as told by your doctor. This is important. Contact a doctor if:  You have more redness, swelling, or pain around your wound.  Your wound feels warm to the touch.  You have a fever or chills.  You have a general feeling of sickness (malaise).  You feel sick to your stomach (nauseous).  You throw up (vomit).  You have pain that does not get  better. Get help right away if:  You have a red streak going away from your wound.  You have any of these coming from your wound: ? Non-clear fluid. ? More blood. ? Pus or a bad smell.  You have trouble moving your injured area.  You lose feeling (have numbness) or feel tingling anywhere on your body. Summary  It is important to get the right medical treatment for animal bites. Treatment can help you to not get an infection. Ask your doctor if you need treatment to prevent an infection that can spread from animals to humans (rabies).  Check your wound every day for signs of infection, such as more redness or swelling instead of less.  If you have a red streak going away from your wound, get medical help right away. This information is not intended to replace advice given to you by your health care provider. Make sure you discuss any questions you have with your health care provider. Document Revised: 10/13/2017 Document Reviewed: 04/28/2017 Elsevier Patient Education  2020 [employer]   Wound Care, Adult Taking care of your wound properly can help to prevent pain, infection, and scarring. It can also help your wound to heal more quickly. How to care for your wound Wound care      Follow instructions from your health care provider about how to take care of your wound. Make sure you: ?  Wash your hands with soap and water before you change the bandage (dressing). If soap and water are not available, use hand sanitizer. ? Change your dressing as told by your health care provider. ? Leave stitches (sutures), skin glue, or adhesive strips in place. These skin closures may need to stay in place for 2 weeks or longer. If adhesive strip edges start to loosen and curl up, you may trim the loose edges. Do not remove adhesive strips completely unless your health care provider tells you to do that.  Check your wound area every day for signs of infection. Check for: ? Redness, swelling,  or pain. ? Fluid or blood. ? Warmth. ? Pus or a bad smell.  Ask your health care provider if you should clean the wound with mild soap and water. Doing this may include: ? Using a clean towel to pat the wound dry after cleaning it. Do not rub or scrub the wound. ? Applying a cream or ointment. Do this only as told by your health care provider. ? Covering the incision with a clean dressing.  Ask your health care provider when you can leave the wound uncovered.  Keep the dressing dry until your health care provider says it can be removed. Do not take baths, swim, use a hot tub, or do anything that would put the wound underwater until your health care provider approves. Ask your health care provider if you can take showers. You may only be allowed to take sponge baths. Medicines   If you were prescribed an antibiotic medicine, cream, or ointment, take or use the antibiotic as told by your health care provider. Do not stop taking or using the antibiotic even if your condition improves.  Take over-the-counter and prescription medicines only as told by your health care provider. If you were prescribed pain medicine, take it 30 or more minutes before you do any wound care or as told by your health care provider. General instructions  Return to your normal activities as told by your health care provider. Ask your health care provider what activities are safe.  Do not scratch or pick at the wound.  Do not use any products that contain nicotine or tobacco, such as cigarettes and e-cigarettes. These may delay wound healing. If you need help quitting, ask your health care provider.  Keep all follow-up visits as told by your health care provider. This is important.  Eat a diet that includes protein, vitamin A, vitamin C, and other nutrient-rich foods to help the wound heal. ? Foods rich in protein include meat, dairy, beans, nuts, and other sources. ? Foods rich in vitamin A include carrots and  dark green, leafy vegetables. ? Foods rich in vitamin C include citrus, tomatoes, and other fruits and vegetables. ? Nutrient-rich foods have protein, carbohydrates, fat, vitamins, or minerals. Eat a variety of healthy foods including vegetables, fruits, and whole grains. Contact a health care provider if:  You received a tetanus shot and you have swelling, severe pain, redness, or bleeding at the injection site.  Your pain is not controlled with medicine.  You have redness, swelling, or pain around the wound.  You have fluid or blood coming from the wound.  Your wound feels warm to the touch.  You have pus or a bad smell coming from the wound.  You have a fever or chills.  You are nauseous or you vomit.  You are dizzy. Get help right away if:  You have a red streak  going away from your wound.  The edges of the wound open up and separate.  Your wound is bleeding, and the bleeding does not stop with gentle pressure.  You have a rash.  You faint.  You have trouble breathing. Summary  Always wash your hands with soap and water before changing your bandage (dressing).  To help with healing, eat foods that are rich in protein, vitamin A, vitamin C, and other nutrients.  Check your wound every day for signs of infection. Contact your health care provider if you suspect that your wound is infected. This information is not intended to replace advice given to you by your health care provider. Make sure you discuss any questions you have with your health care provider. Document Revised: 02/05/2019 Document Reviewed: 05/04/2016 Elsevier Patient Education  2020 [employer].

## 2020-10-12 NOTE — Progress Notes (Signed)
Subjective: CC: Doing well. Having some soreness over lacerations. No new areas of pain. Ambulating in room. Voiding. Husband at bedside. No sob or difficulty breathing. Tolerating secretions. No difficulty swallowing.   Objective: Vital signs in last 24 hours: Temp:  [98 F (36.7 C)-99.1 F (37.3 C)] 98.4 F (36.9 C) (12/12 0350) Pulse Rate:  [96-106] 103 (12/12 0350) Resp:  [16-20] 16 (12/12 0350) BP: (136-175)/(87-128) 150/99 (12/12 0350) SpO2:  [96 %-100 %] 98 % (12/12 0350) Weight:  [101.6 kg-101.9 kg] 101.9 kg (12/12 0148)    Intake/Output from previous day: No intake/output data recorded. Intake/Output this shift: No intake/output data recorded.  PE: Gen:  Alert, NAD, pleasant HEENT: EOM's intact, pupils equal and round Neck: Laceration on anterior right neck below the chin c/d/i with sutures in place. There are 2 smaller, superficial lacerations that are superior to this that are hemostatic.  Card:  RRR Pulm:  CTAB, no W/R/R, effort normal Abd: Soft, NT/ND, +BS Ext: AROM of BUE and BLE without pain. Hemostatic small laceration to the dorsal 5th digit. Able AROM Psych: A&Ox3  Skin: no rashes noted, warm and dry  Lab Results:  Recent Labs    10/12/20 0236  WBC 12.4*  HGB 14.3  HCT 42.0  PLT 391   BMET Recent Labs    10/12/20 0236  NA 135  K 3.5  CL 101  CO2 23  GLUCOSE 93  BUN 9  CREATININE 0.85  CALCIUM 9.1   PT/INR No results for input(s): LABPROT, INR in the last 72 hours. CMP     Component Value Date/Time   NA 135 10/12/2020 0236   K 3.5 10/12/2020 0236   CL 101 10/12/2020 0236   CO2 23 10/12/2020 0236   GLUCOSE 93 10/12/2020 0236   BUN 9 10/12/2020 0236   CREATININE 0.85 10/12/2020 0236   CALCIUM 9.1 10/12/2020 0236   PROT 8.3 (H) 07/09/2017 1506   ALBUMIN 4.7 07/09/2017 1506   AST 24 07/09/2017 1506   ALT 26 07/09/2017 1506   ALKPHOS 59 07/09/2017 1506   BILITOT 1.6 (H) 07/09/2017 1506   GFRNONAA >60 10/12/2020 0236    GFRAA >60 07/09/2017 1506   Lipase  No results found for: LIPASE     Studies/Results: CT Angio Neck W and/or Wo Contrast  Result Date: 10/11/2020 CLINICAL DATA:  29 year old female status post penetrating neck trauma from dog. EXAM: CT ANGIOGRAPHY NECK TECHNIQUE: Multidetector CT imaging of the neck was performed using the standard protocol during bolus administration of intravenous contrast. Multiplanar CT image reconstructions and MIPs were obtained to evaluate the vascular anatomy. Carotid stenosis measurements (when applicable) are obtained utilizing NASCET criteria, using the distal internal carotid diameter as the denominator. CONTRAST:  87mL OMNIPAQUE IOHEXOL 350 MG/ML SOLN COMPARISON:  None. FINDINGS: Skeleton: No osseous abnormality identified. Well aerated paranasal sinuses, small right maxillary mucous retention cysts. Upper chest: Negative. Other neck: Thyroid, larynx, pharynx, parapharyngeal spaces, retropharyngeal space, sublingual space, left submandibular space, bilateral parotid spaces are within normal limits. Soft tissue injury along the right lateral neck from the submandibular space to the thyroid. Multiple small foci of subcutaneous gas (series 4, image 56). Regional mild hematoma/contusion. Thickening and indistinctness of the right platysma. Minimal involvement of the superficial aspect of the right submandibular space. The right submandibular gland remains normal. No discrete hematoma. Bilateral cervical lymph nodes remain within normal limits. Negative visible brain parenchyma, orbits. Aortic arch: 3 vessel arch configuration.  No arch atherosclerosis. Right carotid  system: Negative. Negative visible right ICA siphon, right anterior circulation. Left carotid system: Negative. Negative visible left ICA siphon, left anterior circulation. Vertebral arteries: Normal proximal right subclavian artery and right vertebral artery origin. The right vertebral artery is patent and normal to  the vertebrobasilar junction. Normal right PICA origin. Negative visible basilar artery, posterior circulation. Proximal left subclavian artery and left vertebral artery origin are normal. Codominant left vertebral artery is mildly tortuous but patent and normal to the vertebrobasilar junction. Normal left PICA. Other findings: No contrast extravasation is identified in the right neck region of soft tissue injury. Major venous structures in the neck appear patent, the right IJ is somewhat dominant. Review of the MIP images confirms the above findings IMPRESSION: 1. Soft tissue injury along the right lateral neck from just below the mandible to the thyroid, rarely extending deep to the platysma. No contrast extravasation. No discrete hematoma. 2. No arterial injury or abnormality. Electronically Signed   By: Odessa Fleming M.D.   On: 10/11/2020 21:08   DG Hand Complete Left  Result Date: 10/11/2020 CLINICAL DATA:  Dog bite EXAM: LEFT HAND - COMPLETE 3+ VIEW COMPARISON:  None. FINDINGS: There is no evidence of fracture or dislocation. There is no evidence of arthropathy or other focal bone abnormality. Soft tissues are unremarkable. No radiopaque foreign body or soft tissue gas. IMPRESSION: Negative. Electronically Signed   By: Charlett Nose M.D.   On: 10/11/2020 21:11    Anti-infectives: Anti-infectives (From admission, onward)   Start     Dose/Rate Route Frequency Ordered Stop   10/12/20 0000  ciprofloxacin (CIPRO) IVPB 400 mg        400 mg 200 mL/hr over 60 Minutes Intravenous Every 12 hours 10/11/20 2351     10/12/20 0000  metroNIDAZOLE (FLAGYL) IVPB 500 mg        500 mg 100 mL/hr over 60 Minutes Intravenous Every 8 hours 10/11/20 2351     10/11/20 2200  clindamycin (CLEOCIN) IVPB 600 mg        600 mg 100 mL/hr over 30 Minutes Intravenous  Once 10/11/20 2147 10/11/20 2235       Assessment/Plan ZIOMARA BIRENBAUM is an 29 y.o. female who presented as a trauma consult after a dog bite to the  neck. Neck laceration from dog bite - loosely closed by ED provider, CTA without evidence of vascular trauma. Cont antibiotics. Trial of liquids this AM. If tolerates, can advance diet and likely discharge later today. I have sent the office a message to arrange follow up.  Left finger laceration from dog bite - superficial, local care, x-ray negative, antibiotics   FEN - CLD VTE - Lovenox and Sequential Compression Devices ID - Cipro/flagyl  Dispo - PO challenge. Possible d/c later today.    LOS: 0 days    Jacinto Halim , [facility] 10/12/2020, 9:45 AM Please see Amion for pager number during day hours 7:00am-4:30pm

## 2020-10-12 NOTE — H&P (Signed)
Admitting Physician: Hyman Hopes Nethan Caudillo  Service: Trauma Surgery  CC: Bitten by a dog  Subjective   Mechanism of Injury: Cathy Wells is an 29 y.o. female who presented as a trauma consult after a dog bite to the neck.  Past Medical History:  Diagnosis Date  . Complication of anesthesia    PONV  . Frequency of urination   . GERD (gastroesophageal reflux disease)   . Hematuria   . Left ureteral [place]   . Mild asthma   . Ovarian cyst   . Urgency of urination   . Wears contact lenses     Past Surgical History:  Procedure Laterality Date  . APPENDECTOMY  07/25/2006   OPEN  . URETEROSCOPY WITH HOLMIUM LASER LITHOTRIPSY Left 07/21/2017   Procedure: retrogrades, URETEROSCOPY WITH [place] basketry, stent placement;  Surgeon: Crist Fat, MD;  Location: [facility];  Service: Urology;  Laterality: Left;    Family History  Problem Relation Age of Onset  . Hypertension Mother   . Asthma Father   . Hypertension Father   . Hypertension Maternal Grandfather   . Diabetes Paternal Grandmother   . Hypertension Paternal Grandmother   . COPD Paternal Grandfather     Social:  reports that she has never smoked. She has never used smokeless tobacco. She reports that she does not drink alcohol and does not use drugs.  Allergies:  Allergies  Allergen Reactions  . Doxycycline Nausea And Vomiting  . Amoxapine And Related Hives and Nausea And Vomiting  . Azithromycin Other (See Comments)    "MAKES ME SICK"  . Clarithromycin Nausea And Vomiting  . Erythromycin Other (See Comments)    "SICK ON STOMACH"  . Penicillins Hives    .Has patient had a PCN reaction causing immediate rash, facial/tongue/throat swelling, SOB or lightheadedness with hypotension: Unknown Has patient had a PCN reaction causing severe rash involving mucus membranes or skin necrosis: Unknown Has patient had a PCN reaction that required hospitalization: Unknown Has patient had a PCN  reaction occurring within the last 10 years: No If all of the above answers are "NO", then may proceed with Cephalosporin use.   . Sulfa Antibiotics Hives    Medications: Current Outpatient Medications  Medication Instructions  . acetaminophen (TYLENOL) 1,000 mg, Oral, Every 6 hours PRN  . albuterol (PROVENTIL HFA;VENTOLIN HFA) 108 (90 Base) MCG/ACT inhaler 1-2 puffs, Inhalation, Every 6 hours PRN  . ALPRAZolam (XANAX) 0.5 mg, Oral, 2 times daily PRN  . HYDROcodone-acetaminophen (NORCO/VICODIN) 5-325 MG tablet One tablet every four hours as needed for acute pain.  Limit of five days per [place].  . olmesartan-hydrochlorothiazide (BENICAR HCT) 40-25 MG tablet 1 tablet, Oral, Daily  . pantoprazole (PROTONIX) 40 mg, Oral, Daily    Objective   Primary Survey: Blood pressure (!) 150/99, pulse (!) 103, temperature 98.4 F (36.9 C), temperature source Oral, resp. rate 16, height 5\' 2"  (1.575 m), weight 101.9 kg, last menstrual period 10/11/2020, SpO2 98 %. Airway: Patent, protecting airway Breathing: Bilateral breath sounds, breathing spontaneously Circulation: Stable, Palpable peripheral pulses Disability: Moving all extremities, GCS 15 Environment/Exposure: Warm, dry  Primary Survey Adjuncts:  None  Secondary Survey: Head: Normocephalic, atraumatic Neck: Lacerations on the anterior right neck below the chin and in zone 2.  Appears deep to platisma.  No expanding hematoma or other soft signs of vascular trauma, no air bubbling from the wound, clean wound edges, Chest: Bilateral breath sounds, chest wall stable Abdomen: Soft, non-tender, non-distended  Upper Extremities: Strength and sensation intact, palpable peripheral pulses Lower extremities: Strength and sensation intact, palpable peripheral pulses Back: No step offs or deformities, atraumatic Rectal: defered Psych: Normal mood and affect  Results for orders placed or performed during the hospital encounter of 10/11/20  (from the past 24 hour(s))  Resp Panel by RT-PCR (Flu A&B, Covid) Nasopharyngeal Swab     Status: None   Collection Time: 10/11/20 10:10 PM   Specimen: Nasopharyngeal Swab; Nasopharyngeal(NP) swabs in vial transport medium  Result Value Ref Range   SARS Coronavirus 2 by RT PCR NEGATIVE NEGATIVE   Influenza A by PCR NEGATIVE NEGATIVE   Influenza B by PCR NEGATIVE NEGATIVE  CBC with Differential     Status: Abnormal   Collection Time: 10/12/20  2:36 AM  Result Value Ref Range   WBC 12.4 (H) 4.0 - 10.5 K/uL   RBC 5.08 3.87 - 5.11 MIL/uL   Hemoglobin 14.3 12.0 - 15.0 g/dL   HCT 50.3 54.6 - 56.8 %   MCV 82.7 80.0 - 100.0 fL   MCH 28.1 26.0 - 34.0 pg   MCHC 34.0 30.0 - 36.0 g/dL   RDW 12.7 51.7 - 00.1 %   Platelets 391 150 - 400 K/uL   nRBC 0.0 0.0 - 0.2 %   Neutrophils Relative % 60 %   Neutro Abs 7.4 1.7 - 7.7 K/uL   Lymphocytes Relative 29 %   Lymphs Abs 3.6 0.7 - 4.0 K/uL   Monocytes Relative 8 %   Monocytes Absolute 1.0 0.1 - 1.0 K/uL   Eosinophils Relative 2 %   Eosinophils Absolute 0.2 0.0 - 0.5 K/uL   Basophils Relative 1 %   Basophils Absolute 0.1 0.0 - 0.1 K/uL   Immature Granulocytes 0 %   Abs Immature Granulocytes 0.03 0.00 - 0.07 K/uL  Basic metabolic panel     Status: None   Collection Time: 10/12/20  2:36 AM  Result Value Ref Range   Sodium 135 135 - 145 mmol/L   Potassium 3.5 3.5 - 5.1 mmol/L   Chloride 101 98 - 111 mmol/L   CO2 23 22 - 32 mmol/L   Glucose, Bld 93 70 - 99 mg/dL   BUN 9 6 - 20 mg/dL   Creatinine, Ser 7.49 0.44 - 1.00 mg/dL   Calcium 9.1 8.9 - 44.9 mg/dL   GFR, Estimated >67 >59 mL/min   Anion gap 11 5 - 15     Imaging Orders     CT Angio Neck W and/or Wo Contrast     DG Hand Complete Left   Assessment and Plan   Cathy Wells is an 29 y.o. female who presented as a trauma consult after a dog bite to the neck.  Injuries: Neck laceration from dog bite - loosely closed by ED provider, CTA without evidence of vascular trauma,  antibiotics, overnight observation on trauma service, consider starting diet vs. Esophagram in the AM Left finger laceration from dog bite - superficial, local care, x-ray negative, antibiotics   Consults:  None  FEN - IVF VTE - Lovenox and Sequential Compression Devices ID - Cipro/flagyl   Dispo - Med-Surg Floor    Ivar Drape, M.D. General, Bariatric and Minimally Invasive Surgery  [facility] Use [email or web address] to contact on call provider

## 2020-10-12 NOTE — ED Notes (Signed)
Dr. Ward at bedside.

## 2020-10-14 DIAGNOSIS — F419 Anxiety disorder, unspecified: Secondary | ICD-10-CM | POA: Diagnosis not present

## 2020-10-14 DIAGNOSIS — S1195XD Open bite of unspecified part of neck, subsequent encounter: Secondary | ICD-10-CM | POA: Diagnosis not present

## 2020-10-14 DIAGNOSIS — Z6841 Body Mass Index (BMI) 40.0 and over, adult: Secondary | ICD-10-CM | POA: Diagnosis not present

## 2020-10-14 DIAGNOSIS — K219 Gastro-esophageal reflux disease without esophagitis: Secondary | ICD-10-CM | POA: Diagnosis not present

## 2020-11-03 NOTE — Discharge Summary (Signed)
Physician Discharge Summary  Patient ID: Cathy Wells MRN: [id number] DOB/AGE: Feb 28, 1991 28 y.o.  Admit date: 10/11/2020 Discharge date: 10/12/2020  Admission Diagnoses: Dog bite to neck and left hand  Discharge Diagnoses:  Active Problems:   Laceration of head and neck   Discharged Condition: good  Hospital Course: Ms. Swaim is a 30 yo female who presented after a dog bite to her neck and left hand. She reported bleeding from the neck wound at home but the area was hemostatic on arrival to the ED. She had no difficulty swallowing or breathing. CTA of the neck showed no vascular injuries, and no sign of esophageal injury. The neck laceration was washed out and closed in the ED and the patient was admitted to the trauma service for observation. There were only superficial abrasions on the hand. The patient's diet was advanced, which she tolerated without difficulty. On 12/12 she was tolerating PO intake and pain was controlled. She was examined and deemed appropriate for discharge home on oral antibiotics.  Consults: None  Significant Diagnostic Studies: radiology: CT scan: CTA neck: soft tissue injury of the right lateral neck, no hematoma or vascular injuries  Treatments: antibiotics, closure of neck laceration  Discharge Exam: Blood pressure (!) 151/104, pulse 91, temperature 97.6 F (36.4 C), temperature source Oral, resp. rate 16, height 5\' 2"  (1.575 m), weight 101.9 kg, last menstrual period 10/11/2020, SpO2 96 %. Gen:  Alert, NAD, pleasant HEENT: EOM's intact, pupils equal and round Neck: Laceration on anterior right neck below the chin c/d/i with sutures in place. There are 2 smaller, superficial lacerations that are superior to this that are hemostatic.  Card:  RRR Pulm:  CTAB, no W/R/R, effort normal Abd: Soft, NT/ND, +BS Ext: AROM of BUE and BLE without pain. Hemostatic small laceration to the dorsal 5th digit. Able AROM Psych: A&Ox3  Skin: no rashes noted, warm and  dry  Disposition: Discharge disposition: 01-Home or Self Care       Discharge Instructions    Call MD for:  persistant nausea and vomiting   Complete by: As directed    Call MD for:  redness, tenderness, or signs of infection (pain, swelling, redness, odor or green/yellow discharge around incision site)   Complete by: As directed    Call MD for:  severe uncontrolled pain   Complete by: As directed    Call MD for:  temperature >100.4   Complete by: As directed    Diet general   Complete by: As directed    Increase activity slowly   Complete by: As directed    No wound care   Complete by: As directed    Apply bacitracin to wounds.     Allergies as of 10/12/2020      Reactions   Doxycycline Nausea And Vomiting   Amoxapine And Related Hives, Nausea And Vomiting   Azithromycin Other (See Comments)   "MAKES ME SICK"   Clarithromycin Nausea And Vomiting   Erythromycin Other (See Comments)   "SICK ON STOMACH"   Penicillins Hives   .Has patient had a PCN reaction causing immediate rash, facial/tongue/throat swelling, SOB or lightheadedness with hypotension: Unknown Has patient had a PCN reaction causing severe rash involving mucus membranes or skin necrosis: Unknown Has patient had a PCN reaction that required hospitalization: Unknown Has patient had a PCN reaction occurring within the last 10 years: No If all of the above answers are "NO", then may proceed with Cephalosporin use.   Sulfa Antibiotics Hives  Medication List    STOP taking these medications   HYDROcodone-acetaminophen 5-325 MG tablet Commonly known as: NORCO/VICODIN     TAKE these medications   acetaminophen 500 MG tablet Commonly known as: TYLENOL Take 1,000 mg by mouth every 6 (six) hours as needed for moderate pain, headache or fever.   albuterol 108 (90 Base) MCG/ACT inhaler Commonly known as: VENTOLIN HFA Inhale 1-2 puffs into the lungs every 6 (six) hours as needed for wheezing or shortness  of breath.   ALPRAZolam 0.5 MG tablet Commonly known as: XANAX Take 0.5 mg by mouth 2 (two) times daily as needed for anxiety.   bacitracin ointment Apply topically 2 (two) times daily.   olmesartan-hydrochlorothiazide 40-25 MG tablet Commonly known as: BENICAR HCT Take 1 tablet by mouth daily.   ondansetron 4 MG disintegrating tablet Commonly known as: ZOFRAN-ODT Take 1 tablet (4 mg total) by mouth every 6 (six) hours as needed for nausea.   oxyCODONE 5 MG immediate release tablet Commonly known as: Oxy IR/ROXICODONE Take 1 tablet (5 mg total) by mouth every 4 (four) hours as needed for severe pain.   pantoprazole 40 MG tablet Commonly known as: PROTONIX Take 40 mg by mouth daily.     ASK your doctor about these medications   ciprofloxacin 500 MG tablet Commonly known as: CIPRO Take 1 tablet (500 mg total) by mouth 2 (two) times daily for 7 days. Ask about: Should I take this medication?   metroNIDAZOLE 500 MG tablet Commonly known as: Flagyl Take 1 tablet (500 mg total) by mouth 3 (three) times daily for 7 days. Ask about: Should I take this medication?       Follow-up Information    Assunta Found, MD Follow up.   Specialty: Family Medicine Contact information: [address] [place] [address] [phone]        Surgery, [place]. Call in 1 day(s).   Specialty: General Surgery Why: to confirm follow up for suture removal Contact information: [address] [place] [address] [phone]               Signed: Fritzi Mandes 11/03/2020, 10:10 AM

## 2021-03-12 DIAGNOSIS — F419 Anxiety disorder, unspecified: Secondary | ICD-10-CM | POA: Diagnosis not present

## 2021-03-12 DIAGNOSIS — Z6841 Body Mass Index (BMI) 40.0 and over, adult: Secondary | ICD-10-CM | POA: Diagnosis not present

## 2021-03-12 DIAGNOSIS — Z1331 Encounter for screening for depression: Secondary | ICD-10-CM | POA: Diagnosis not present

## 2021-03-12 DIAGNOSIS — I10 Essential (primary) hypertension: Secondary | ICD-10-CM | POA: Diagnosis not present

## 2021-04-03 DIAGNOSIS — Z124 Encounter for screening for malignant neoplasm of cervix: Secondary | ICD-10-CM | POA: Diagnosis not present

## 2021-04-03 DIAGNOSIS — Z6841 Body Mass Index (BMI) 40.0 and over, adult: Secondary | ICD-10-CM | POA: Diagnosis not present

## 2021-04-03 DIAGNOSIS — J45909 Unspecified asthma, uncomplicated: Secondary | ICD-10-CM | POA: Diagnosis not present

## 2021-04-03 DIAGNOSIS — Z01411 Encounter for gynecological examination (general) (routine) with abnormal findings: Secondary | ICD-10-CM | POA: Diagnosis not present

## 2021-04-03 DIAGNOSIS — N979 Female infertility, unspecified: Secondary | ICD-10-CM | POA: Diagnosis not present

## 2021-04-22 ENCOUNTER — Ambulatory Visit
Admission: RE | Admit: 2021-04-22 | Discharge: 2021-04-22 | Disposition: A | Discharge status: Home / Self Care | Payer: BC Managed Care – PPO | Source: Ambulatory Visit | Attending: Family Medicine | Admitting: Family Medicine

## 2021-04-22 ENCOUNTER — Other Ambulatory Visit: Payer: Self-pay

## 2021-04-22 VITALS — BP 118/82 | HR 77 | Temp 97.5°F | Resp 18

## 2021-04-22 DIAGNOSIS — R350 Frequency of micturition: Secondary | ICD-10-CM | POA: Diagnosis not present

## 2021-04-22 DIAGNOSIS — N3000 Acute cystitis without hematuria: Secondary | ICD-10-CM | POA: Insufficient documentation

## 2021-04-22 DIAGNOSIS — R300 Dysuria: Secondary | ICD-10-CM | POA: Diagnosis not present

## 2021-04-22 DIAGNOSIS — J028 Acute pharyngitis due to other specified organisms: Secondary | ICD-10-CM | POA: Insufficient documentation

## 2021-04-22 DIAGNOSIS — B9789 Other viral agents as the cause of diseases classified elsewhere: Secondary | ICD-10-CM | POA: Insufficient documentation

## 2021-04-22 LAB — POCT URINALYSIS DIP (MANUAL ENTRY)
Bilirubin, UA: NEGATIVE
Glucose, UA: NEGATIVE mg/dL
Ketones, POC UA: NEGATIVE mg/dL
Nitrite, UA: NEGATIVE
Spec Grav, UA: 1.02 (ref 1.010–1.025)
Urobilinogen, UA: 0.2 E.U./dL
pH, UA: 7 (ref 5.0–8.0)

## 2021-04-22 LAB — POCT RAPID STREP A (OFFICE): Rapid Strep A Screen: NEGATIVE

## 2021-04-22 MED ORDER — CIPROFLOXACIN HCL 500 MG PO TABS: 500.0000 mg | ORAL_TABLET | Freq: Two times a day (BID) | ORAL | 0 refills | Status: DC

## 2021-04-22 MED ORDER — FLUCONAZOLE 150 MG PO TABS: ORAL_TABLET | ORAL | 0 refills | Status: DC

## 2021-04-22 NOTE — ED Triage Notes (Signed)
Pt presents with c/o dysuria that began last night

## 2021-04-22 NOTE — Discharge Instructions (Addendum)
You may have a urinary tract infection.   Prescribed Cipro 500mg  twice a day for 7 days  I have sent in fluconazole in case of yeast. Take one tablet at the onset of symptoms. If symptoms are still present in 3 days, take the second tablet.   We are going to culture your urine and will call you as soon as we have the results.   Drink plenty of water, 8-10 glasses per day.   You may take AZO over the counter for painful urination.  Your COVID test is pending. You should self quarantine until the test results are back.    Take Tylenol or ibuprofen as needed for fever or discomfort.  Rest and keep yourself hydrated.    Follow-up with your primary care provider if your symptoms are not improving.

## 2021-04-22 NOTE — ED Provider Notes (Signed)
[facility]   CC: UTI  SUBJECTIVE:  Cathy Wells is a 30 y.o. female who complains of urinary frequency, urgency and dysuria for the past day. Has taken AZO with some relief. Patient denies a precipitating event, recent sexual encounter, excessive caffeine intake. Localizes the pain to the lower abdomen. Pain is intermittent and describes it as sharp/burning.  Has tried OTC medications without relief.  Symptoms are made worse with urination. Admits to similar symptoms in the past. Denies fever, chills, nausea, vomiting, abdominal pain, flank pain, abnormal vaginal discharge or bleeding, hematuria.    LMP: No LMP recorded.  ROS: As in HPI.  All other pertinent ROS negative.     Past Medical History:  Diagnosis Date   Complication of anesthesia    PONV   Frequency of urination    GERD (gastroesophageal reflux disease)    Hematuria    Left ureteral Doenges    Mild asthma    Ovarian cyst    Urgency of urination    Wears contact lenses    Past Surgical History:  Procedure Laterality Date   APPENDECTOMY  07/25/2006   OPEN   URETEROSCOPY WITH HOLMIUM LASER LITHOTRIPSY Left 07/21/2017   Procedure: retrogrades, URETEROSCOPY WITH [place] basketry, stent placement;  Surgeon: Crist Fat, MD;  Location: [facility];  Service: Urology;  Laterality: Left;   Allergies  Allergen Reactions   Doxycycline Nausea And Vomiting   Amoxapine And Related Hives and Nausea And Vomiting   Azithromycin Other (See Comments)    "MAKES ME SICK"   Clarithromycin Nausea And Vomiting   Erythromycin Other (See Comments)    "SICK ON STOMACH"   Penicillins Hives    .Has patient had a PCN reaction causing immediate rash, facial/tongue/throat swelling, SOB or lightheadedness with hypotension: Unknown Has patient had a PCN reaction causing severe rash involving mucus membranes or skin necrosis: Unknown Has patient had a PCN reaction that required hospitalization: Unknown Has  patient had a PCN reaction occurring within the last 10 years: No If all of the above answers are "NO", then may proceed with Cephalosporin use.    Sulfa Antibiotics Hives   No current facility-administered medications on file prior to encounter.   Current Outpatient Medications on File Prior to Encounter  Medication Sig Dispense Refill   acetaminophen (TYLENOL) 500 MG tablet Take 1,000 mg by mouth every 6 (six) hours as needed for moderate pain, headache or fever.     albuterol (PROVENTIL HFA;VENTOLIN HFA) 108 (90 Base) MCG/ACT inhaler Inhale 1-2 puffs into the lungs every 6 (six) hours as needed for wheezing or shortness of breath.     ALPRAZolam (XANAX) 0.5 MG tablet Take 0.5 mg by mouth 2 (two) times daily as needed for anxiety.     bacitracin ointment Apply topically 2 (two) times daily. 120 g 0   olmesartan-hydrochlorothiazide (BENICAR HCT) 40-25 MG tablet Take 1 tablet by mouth daily.     ondansetron (ZOFRAN-ODT) 4 MG disintegrating tablet Take 1 tablet (4 mg total) by mouth every 6 (six) hours as needed for nausea. 20 tablet 0   oxyCODONE (OXY IR/ROXICODONE) 5 MG immediate release tablet Take 1 tablet (5 mg total) by mouth every 4 (four) hours as needed for severe pain. 15 tablet 0   pantoprazole (PROTONIX) 40 MG tablet Take 40 mg by mouth daily.     Social History   Socioeconomic History   Marital status: Married    Spouse name: Not on file   Number  of children: Not on file   Years of education: Not on file   Highest education level: Not on file  Occupational History   Not on file  Tobacco Use   Smoking status: Never   Smokeless tobacco: Never  Vaping Use   Vaping Use: Never used  Substance and Sexual Activity   Alcohol use: No   Drug use: No   Sexual activity: Yes    Birth control/protection: None  Other Topics Concern   Not on file  Social History Narrative   Not on file   Social Determinants of Health   Financial Resource Strain: Not on file  Food Insecurity:  Not on file  Transportation Needs: Not on file  Physical Activity: Not on file  Stress: Not on file  Social Connections: Not on file  Intimate Partner Violence: Not on file   Family History  Problem Relation Age of Onset   Hypertension Mother    Asthma Father    Hypertension Father    Hypertension Maternal Grandfather    Diabetes Paternal Grandmother    Hypertension Paternal Grandmother    COPD Paternal Grandfather     OBJECTIVE:  Vitals:   04/22/21 1220  BP: 118/82  Pulse: 77  Resp: 18  Temp: (!) 97.5 F (36.4 C)  TempSrc: Tympanic  SpO2: 98%   General appearance: AOx3 in no acute distress HEENT: NCAT. Oropharynx clear.  Lungs: clear to auscultation bilaterally without adventitious breath sounds Heart: regular rate and rhythm. Radial pulses 2+ symmetrical bilaterally Abdomen: soft; non-distended; no tenderness; bowel sounds present; no guarding or rebound tenderness Back: no CVA tenderness Extremities: no edema; symmetrical with no gross deformities Skin: warm and dry Neurologic: Ambulates from chair to exam table without difficulty Psychological: alert and cooperative; normal mood and affect  Labs Reviewed  POCT URINALYSIS DIP (MANUAL ENTRY) - Abnormal; Notable for the following components:      Result Value   Color, UA orange (*)    Blood, UA small (*)    Protein Ur, POC trace (*)    Leukocytes, UA Moderate (2+) (*)    All other components within normal limits  URINE CULTURE  CULTURE, GROUP A STREP ([facility])  NOVEL CORONAVIRUS, NAA  POCT RAPID STREP A (OFFICE)    ASSESSMENT & PLAN:  1. Viral sore throat   2. Dysuria   3. Urinary frequency   4. Acute cystitis without hematuria     Meds ordered this encounter  Medications   ciprofloxacin (CIPRO) 500 MG tablet    Sig: Take 1 tablet (500 mg total) by mouth 2 (two) times daily.    Dispense:  14 tablet    Refill:  0    Order Specific Question:   Supervising Provider    Answer:   Merrilee Jansky  [[id number]]   fluconazole (DIFLUCAN) 150 MG tablet    Sig: Take one tablet at the onset of symptoms, if still having symptoms in 3 days, take the second tablet.    Dispense:  2 tablet    Refill:  0    Order Specific Question:   Supervising Provider    Answer:   Merrilee Jansky [id number]   Your rapid strep test is negative.  A throat culture is pending; we will call you if it is positive requiring treatment.   UA concerning today Cipro prescribed 500mg  BID x 7 days Prescribed fluconazole in case of yeast Urine culture sent  We will call you with abnormal results that need further treatment  Push fluids and get plenty of rest Take antibiotic as directed and to completion Take pyridium as prescribed and as needed for symptomatic relief Covid swab obtained in office today.   Patient instructed to quarantine until results are back and negative.   If results are negative, patient may resume daily schedule as tolerated once they are fever free for 24 hours without the use of antipyretic medications.   If results are positive, patient instructed to quarantine for at least 5 days from symptom onset.  If after 5 days symptoms have resolved, may return to work with a well fitting mask for the next 5 days. If symptomatic after day 5, isolation should be extended to 10 days. Patient instructed to follow-up with primary care or with this office as needed.   Patient instructed to follow-up in the ER for trouble swallowing, trouble breathing, other concerning symptoms.     Moshe Cipro, NP 04/22/21 1346

## 2021-04-23 LAB — SARS-COV-2, NAA 2 DAY TAT

## 2021-04-23 LAB — NOVEL CORONAVIRUS, NAA: SARS-CoV-2, NAA: NOT DETECTED

## 2021-04-25 LAB — CULTURE, GROUP A STREP (THRC)

## 2021-04-25 LAB — URINE CULTURE: Culture: >=100000 — AB

## 2021-06-08 ENCOUNTER — Ambulatory Visit: Payer: Self-pay

## 2021-07-15 DIAGNOSIS — J069 Acute upper respiratory infection, unspecified: Secondary | ICD-10-CM | POA: Diagnosis not present

## 2021-07-21 DIAGNOSIS — H6693 Otitis media, unspecified, bilateral: Secondary | ICD-10-CM | POA: Diagnosis not present

## 2021-07-21 DIAGNOSIS — Z01411 Encounter for gynecological examination (general) (routine) with abnormal findings: Secondary | ICD-10-CM | POA: Diagnosis not present

## 2021-07-21 DIAGNOSIS — Z6841 Body Mass Index (BMI) 40.0 and over, adult: Secondary | ICD-10-CM | POA: Diagnosis not present

## 2021-07-21 DIAGNOSIS — N979 Female infertility, unspecified: Secondary | ICD-10-CM | POA: Diagnosis not present

## 2021-09-07 DIAGNOSIS — I10 Essential (primary) hypertension: Secondary | ICD-10-CM | POA: Diagnosis not present

## 2021-09-07 DIAGNOSIS — B3731 Acute candidiasis of vulva and vagina: Secondary | ICD-10-CM | POA: Diagnosis not present

## 2021-09-07 DIAGNOSIS — Z6841 Body Mass Index (BMI) 40.0 and over, adult: Secondary | ICD-10-CM | POA: Diagnosis not present

## 2021-09-28 ENCOUNTER — Emergency Department (HOSPITAL_COMMUNITY)
Admission: EM | Admit: 2021-09-28 | Discharge: 2021-09-28 | Disposition: A | Discharge status: Home / Self Care | Payer: BC Managed Care – PPO | Attending: Emergency Medicine | Admitting: Emergency Medicine

## 2021-09-28 ENCOUNTER — Emergency Department (HOSPITAL_COMMUNITY): Payer: BC Managed Care – PPO

## 2021-09-28 ENCOUNTER — Other Ambulatory Visit: Payer: Self-pay

## 2021-09-28 ENCOUNTER — Encounter (HOSPITAL_COMMUNITY): Payer: Self-pay

## 2021-09-28 DIAGNOSIS — S3992XA Unspecified injury of lower back, initial encounter: Secondary | ICD-10-CM | POA: Diagnosis not present

## 2021-09-28 DIAGNOSIS — W1781XA Fall down embankment (hill), initial encounter: Secondary | ICD-10-CM | POA: Insufficient documentation

## 2021-09-28 DIAGNOSIS — M545 Low back pain, unspecified: Secondary | ICD-10-CM | POA: Diagnosis not present

## 2021-09-28 DIAGNOSIS — J45909 Unspecified asthma, uncomplicated: Secondary | ICD-10-CM | POA: Insufficient documentation

## 2021-09-28 DIAGNOSIS — S39012A Strain of muscle, fascia and tendon of lower back, initial encounter: Secondary | ICD-10-CM | POA: Insufficient documentation

## 2021-09-28 DIAGNOSIS — M62830 Muscle spasm of back: Secondary | ICD-10-CM | POA: Diagnosis not present

## 2021-09-28 DIAGNOSIS — Z6841 Body Mass Index (BMI) 40.0 and over, adult: Secondary | ICD-10-CM | POA: Diagnosis not present

## 2021-09-28 DIAGNOSIS — S335XXA Sprain of ligaments of lumbar spine, initial encounter: Secondary | ICD-10-CM | POA: Diagnosis not present

## 2021-09-28 IMAGING — DX DG LUMBAR SPINE COMPLETE 4+V
5 series · 5 of 5 positions shown · non-contrast
Comparison: None.

CLINICAL DATA: Worsening low back pain

EXAM:
LUMBAR SPINE - COMPLETE 4+ VIEW

[l-spine ap]
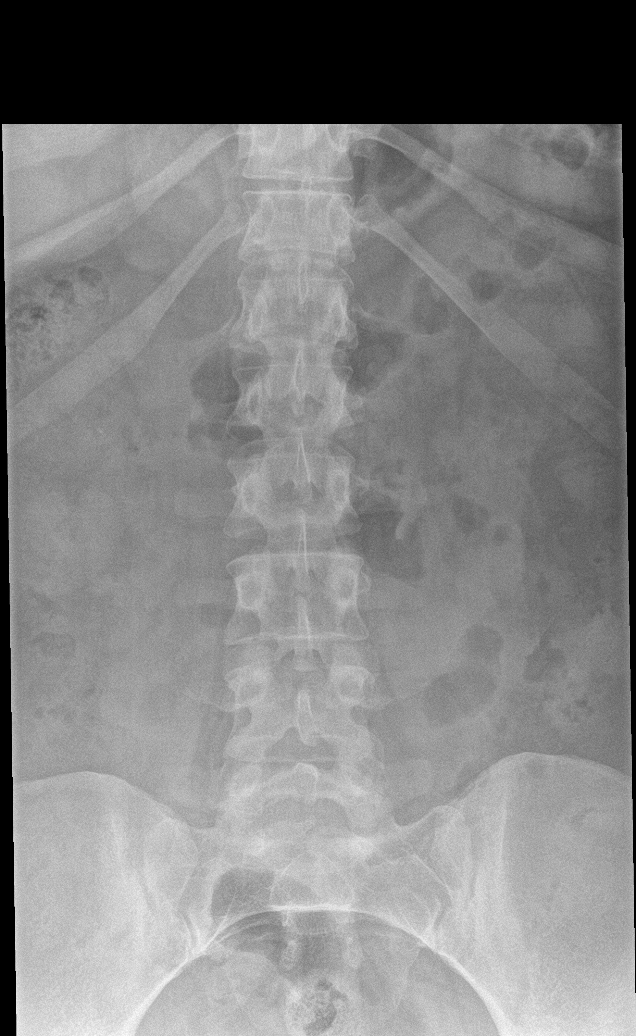

[l-spine obl (1 of 2)]
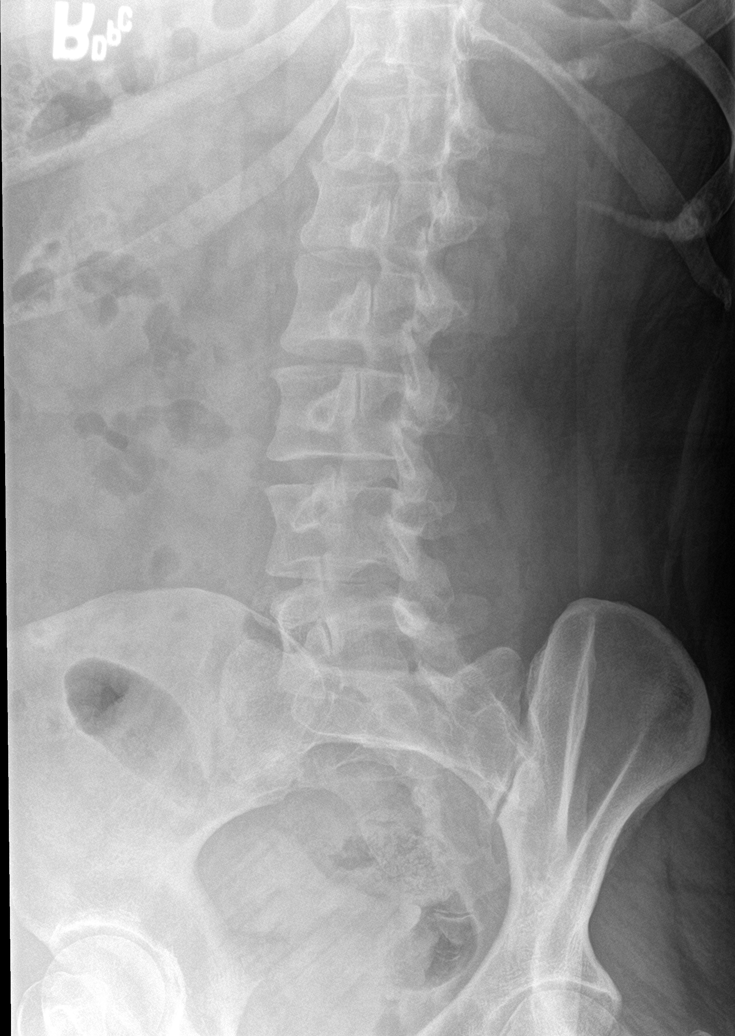

[l-spine obl (2 of 2)]
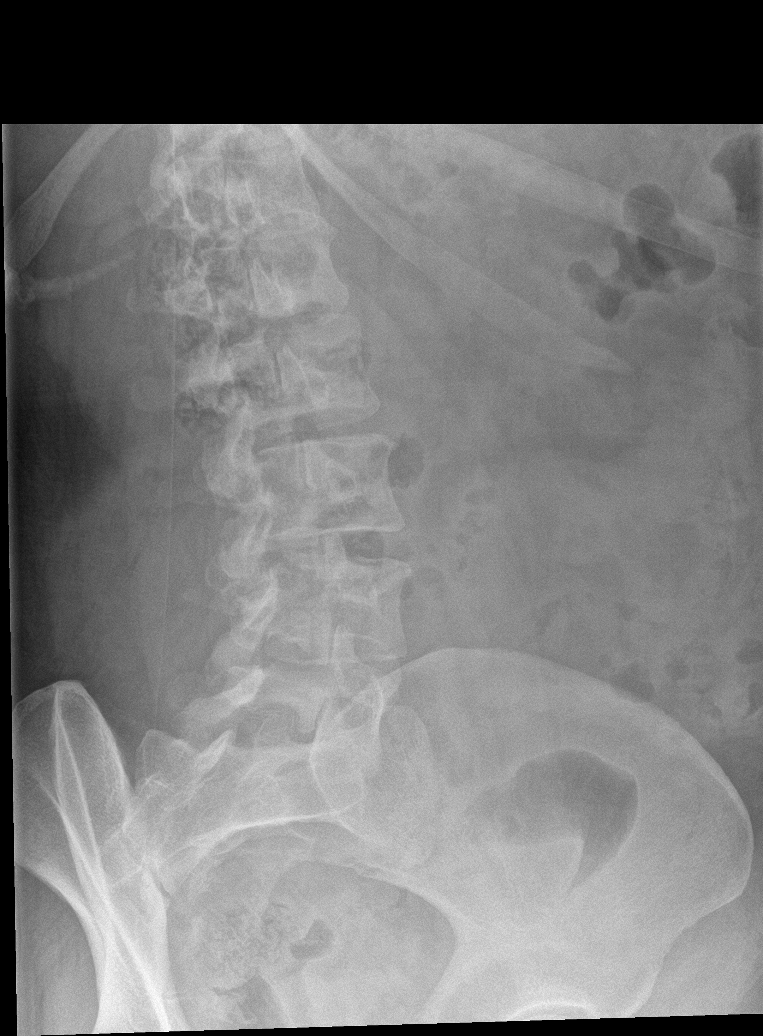

[l-spine lat]
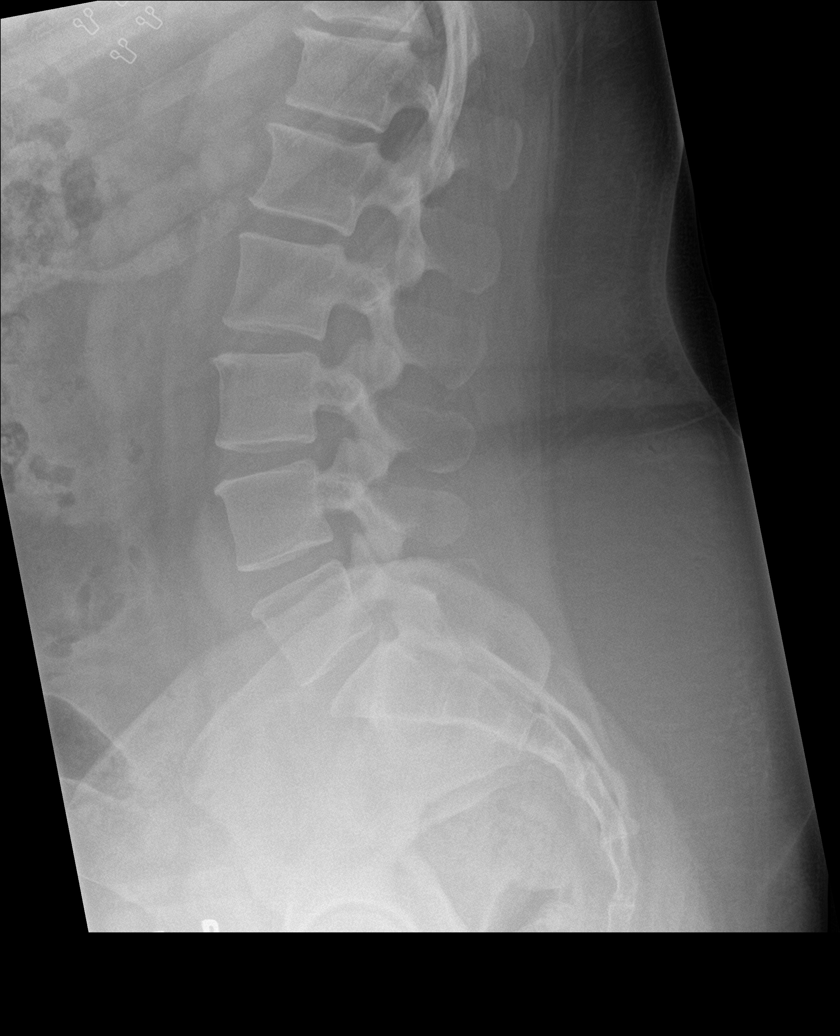

[l-spine spot]
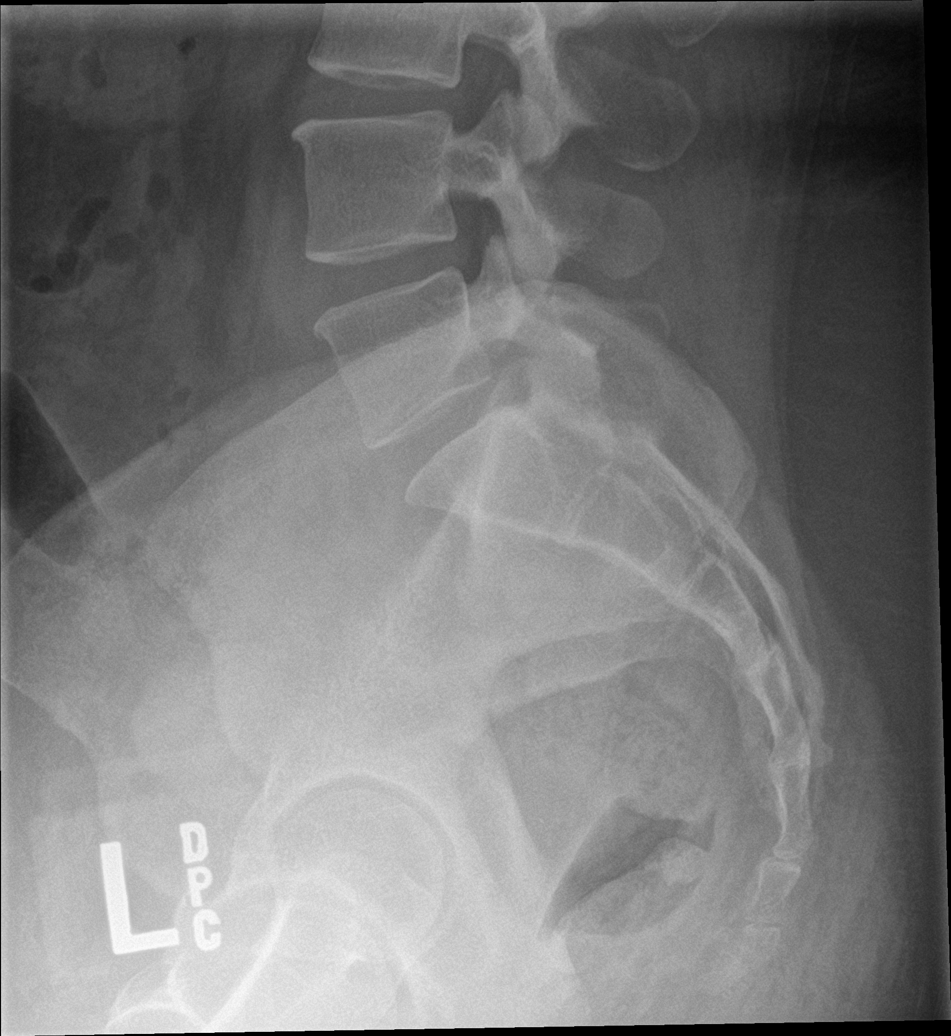

[5 of 5 positions shown; findings below may reference images not displayed]

FINDINGS: There is no evidence of lumbar spine fracture. Alignment is normal.
Intervertebral disc spaces are maintained.
IMPRESSION: Negative.

## 2021-09-28 MED ORDER — NAPROXEN 500 MG PO TABS: 500.0000 mg | ORAL_TABLET | Freq: Two times a day (BID) | ORAL | 0 refills | Status: DC

## 2021-09-28 MED ORDER — DIAZEPAM 5 MG PO TABS: 5.0000 mg | ORAL_TABLET | Freq: Four times a day (QID) | ORAL | 0 refills | Status: DC | PRN

## 2021-09-28 MED ORDER — METHOCARBAMOL 500 MG PO TABS
1000.0000 mg | ORAL_TABLET | Freq: Once | ORAL | Status: AC
  Administered 2021-09-28: 05:00:00 1000 mg via ORAL
  Filled 2021-09-28: qty 2

## 2021-09-28 MED ORDER — KETOROLAC TROMETHAMINE 15 MG/ML IJ SOLN
15.0000 mg | Freq: Once | INTRAMUSCULAR | Status: AC
  Administered 2021-09-28: 05:00:00 15 mg via INTRAMUSCULAR
  Filled 2021-09-28: qty 1

## 2021-09-28 MED ORDER — DIAZEPAM 5 MG PO TABS
5.0000 mg | ORAL_TABLET | Freq: Once | ORAL | Status: AC
  Administered 2021-09-28: 07:00:00 5 mg via ORAL
  Filled 2021-09-28: qty 1

## 2021-09-28 MED ORDER — DIAZEPAM 2 MG PO TABS: 2.0000 mg | ORAL_TABLET | Freq: Once | ORAL | Status: DC

## 2021-09-28 MED ORDER — LIDOCAINE 5 % EX PTCH: 1.0000 | MEDICATED_PATCH | CUTANEOUS | 0 refills | Status: DC

## 2021-09-28 NOTE — ED Provider Notes (Signed)
[facility] Emergency Department Provider Note MRN:  [id number]  Arrival date & time: 09/28/21     Chief Complaint   Back Pain   History of Present Illness   Cathy Wells is a 30 y.o. year-old female with no pertinent past medical history presenting to the ED with chief complaint of back pain.   Location: Lumbar, midline and left side Duration: A few hours Onset: Gradual Timing: Constant Description: Soreness Severity: Severe Exacerbating/Alleviating Factors: Worse with motion Associated Symptoms: None Pertinent Negatives: No numbness or weakness to the legs, no bowel or bladder dysfunction, no fever  Additional History: Slipped and fell down a hill yesterday evening, landed on her bottom but it did not cause any significant pain at the time.  Review of Systems  A complete 10 system review of systems was obtained and all systems are negative except as noted in the HPI and PMH.   Patient's Health History    Past Medical History:  Diagnosis Date   Complication of anesthesia    PONV   Frequency of urination    GERD (gastroesophageal reflux disease)    Hematuria    Left ureteral Ollinger    Mild asthma    Ovarian cyst    Urgency of urination    Wears contact lenses     Past Surgical History:  Procedure Laterality Date   APPENDECTOMY  07/25/2006   OPEN   URETEROSCOPY WITH HOLMIUM LASER LITHOTRIPSY Left 07/21/2017   Procedure: retrogrades, URETEROSCOPY WITH Garciagarcia basketry, stent placement;  Surgeon: Crist Fat, MD;  Location: [facility];  Service: Urology;  Laterality: Left;    Family History  Problem Relation Age of Onset   Hypertension Mother    Asthma Father    Hypertension Father    Hypertension Maternal Grandfather    Diabetes Paternal Grandmother    Hypertension Paternal Grandmother    COPD Paternal Grandfather     Social History   Socioeconomic History   Marital status: Married    Spouse name: Not on  file   Number of children: Not on file   Years of education: Not on file   Highest education level: Not on file  Occupational History   Not on file  Tobacco Use   Smoking status: Never   Smokeless tobacco: Never  Vaping Use   Vaping Use: Never used  Substance and Sexual Activity   Alcohol use: No   Drug use: No   Sexual activity: Yes    Birth control/protection: None  Other Topics Concern   Not on file  Social History Narrative   Not on file   Social Determinants of Health   Financial Resource Strain: Not on file  Food Insecurity: Not on file  Transportation Needs: Not on file  Physical Activity: Not on file  Stress: Not on file  Social Connections: Not on file  Intimate Partner Violence: Not on file     Physical Exam   Vitals:   09/28/21 0512  BP: 126/81  Pulse: 100  Resp: 20  Temp: 98.3 F (36.8 C)  SpO2: 100%    CONSTITUTIONAL: Well-appearing, NAD NEURO:  Alert and oriented x 3, normal and symmetric strength and sensation, normal coordination, normal speech EYES:  eyes equal and reactive ENT/NECK:  no LAD, no JVD CARDIO: Regular rate, well-perfused, normal S1 and S2 PULM:  CTAB no wheezing or rhonchi GI/GU:  normal bowel sounds, non-distended, non-tender MSK/SPINE:  No gross deformities, no edema SKIN:  no rash,  atraumatic PSYCH:  Appropriate speech and behavior  *Additional and/or pertinent findings included in MDM below  Diagnostic and Interventional Summary    EKG Interpretation  Date/Time:    Ventricular Rate:    PR Interval:    QRS Duration:   QT Interval:    QTC Calculation:   R Axis:     Text Interpretation:         Labs Reviewed - No data to display  DG Lumbar Spine Complete  Final Result      Medications  diazepam (VALIUM) tablet 5 mg (has no administration in time range)  methocarbamol (ROBAXIN) tablet 1,000 mg (1,000 mg Oral Given 09/28/21 0527)  ketorolac (TORADOL) 15 MG/ML injection 15 mg (15 mg Intramuscular Given  09/28/21 0527)     Procedures  /  Critical Care Procedures  ED Course and Medical Decision Making  I have reviewed the triage vital signs, the nursing notes, and pertinent available records from the EMR.  Listed above are laboratory and imaging tests that I personally ordered, reviewed, and interpreted and then considered in my medical decision making (see below for details).  Recent fall and potential back trauma, will obtain screening x-ray, but favoring muscle strain or spasm.  There is no numbness or weakness or bowel or bladder dysfunction, no red flag symptoms to suggest myelopathy.  Anticipating discharge after better pain control.       Barth Kirks. Sedonia Small, MD Jud [email or web address]  Final Clinical Impressions(s) / ED Diagnoses     ICD-10-CM   1. Strain of lumbar region, initial encounter  S39.012A       ED Discharge Orders          Ordered    diazepam (VALIUM) 5 MG tablet  Every 6 hours PRN        09/28/21 0622    naproxen (NAPROSYN) 500 MG tablet  2 times daily        09/28/21 0622    lidocaine (LIDODERM) 5 %  Every 24 hours        09/28/21 0622             Discharge Instructions Discussed with and Provided to Patient:     Discharge Instructions      You were evaluated in the Emergency Department and after careful evaluation, we did not find any emergent condition requiring admission or further testing in the hospital.  Your exam/testing today was overall reassuring.  Symptoms seem to be due to muscle strain or spasm.  Take the Naprosyn anti-inflammatory as directed for pain.  Can also use the numbing patches as needed.  For more significant pain, can use the Valium.  This is a strong muscle relaxer and please use caution as it can cause drowsiness.  Please return to the Emergency Department if you experience any worsening of your condition.  Thank you for allowing [place] to be a part of your  care.         Maudie Flakes, MD 09/28/21 [phone]

## 2021-09-28 NOTE — Discharge Instructions (Signed)
You were evaluated in the Emergency Department and after careful evaluation, we did not find any emergent condition requiring admission or further testing in the hospital.  Your exam/testing today was overall reassuring.  Symptoms seem to be due to muscle strain or spasm.  Take the Naprosyn anti-inflammatory as directed for pain.  Can also use the numbing patches as needed.  For more significant pain, can use the Valium.  This is a strong muscle relaxer and please use caution as it can cause drowsiness.  Please return to the Emergency Department if you experience any worsening of your condition.  Thank you for allowing [place] to be a part of your care.

## 2021-09-28 NOTE — ED Triage Notes (Signed)
Pov from home with husband with cc of increasing back pain over the last couple days however last night it got worse. She was looking for a  deer when she slipped over some wet leaves and slid on her bottom down the hill. The fall did not cause any pain. She woke up around 0230 this am with worse lumbar pain. That goes down both legs but is worse  on the left side.

## 2021-10-08 ENCOUNTER — Other Ambulatory Visit (HOSPITAL_COMMUNITY): Payer: Self-pay | Admitting: Physician Assistant

## 2021-10-08 ENCOUNTER — Other Ambulatory Visit: Payer: Self-pay | Admitting: Physician Assistant

## 2021-10-08 DIAGNOSIS — M62830 Muscle spasm of back: Secondary | ICD-10-CM

## 2021-10-08 DIAGNOSIS — S335XXA Sprain of ligaments of lumbar spine, initial encounter: Secondary | ICD-10-CM

## 2021-10-08 DIAGNOSIS — M5416 Radiculopathy, lumbar region: Secondary | ICD-10-CM

## 2021-10-11 ENCOUNTER — Ambulatory Visit (HOSPITAL_COMMUNITY): Payer: BC Managed Care – PPO

## 2021-10-11 ENCOUNTER — Other Ambulatory Visit: Payer: Self-pay

## 2021-10-11 ENCOUNTER — Ambulatory Visit (HOSPITAL_COMMUNITY)
Admission: RE | Admit: 2021-10-11 | Discharge: 2021-10-11 | Disposition: A | Discharge status: Home / Self Care | Payer: BC Managed Care – PPO | Source: Ambulatory Visit | Attending: Physician Assistant | Admitting: Physician Assistant

## 2021-10-11 DIAGNOSIS — M62830 Muscle spasm of back: Secondary | ICD-10-CM | POA: Diagnosis not present

## 2021-10-11 DIAGNOSIS — M5416 Radiculopathy, lumbar region: Secondary | ICD-10-CM | POA: Diagnosis not present

## 2021-10-11 DIAGNOSIS — S335XXA Sprain of ligaments of lumbar spine, initial encounter: Secondary | ICD-10-CM | POA: Diagnosis not present

## 2021-10-11 DIAGNOSIS — M545 Low back pain, unspecified: Secondary | ICD-10-CM | POA: Diagnosis not present

## 2021-10-11 DIAGNOSIS — M79605 Pain in left leg: Secondary | ICD-10-CM | POA: Diagnosis not present

## 2021-10-11 DIAGNOSIS — M533 Sacrococcygeal disorders, not elsewhere classified: Secondary | ICD-10-CM | POA: Diagnosis not present

## 2021-10-11 DIAGNOSIS — M79604 Pain in right leg: Secondary | ICD-10-CM | POA: Diagnosis not present

## 2021-10-11 IMAGING — MR MR PELVIS W/O CM
4 of 5 series · 32 of 48 positions shown · non-contrast
Comparison: CT [DATE]

CLINICAL DATA: Low back pain with bilateral leg pain, left worse
than right. Recent fall

EXAM:
MRI PELVIS WITHOUT CONTRAST
TECHNIQUE: Multiplanar multisequence MR imaging of the pelvis was performed. No
intravenous contrast was administered.

[Series 17: T1 · coronal · 4.0mm · 1.04mm/px · 9 of 32 slices shown (1 of 2)]
[im 1/32]
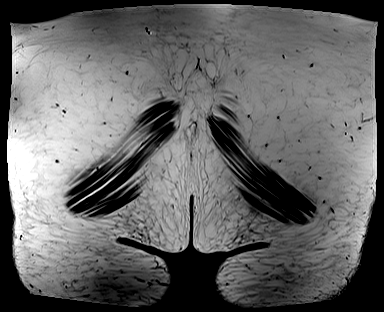
[im 4/32]
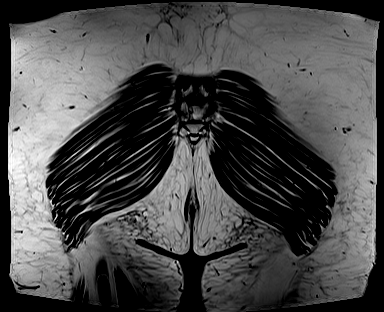
[im 8/32]
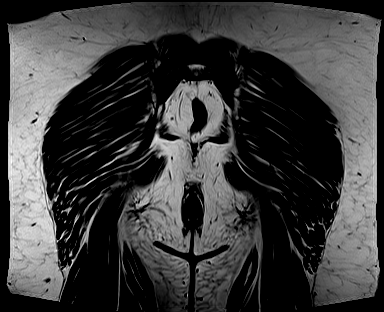
[im 12/32]
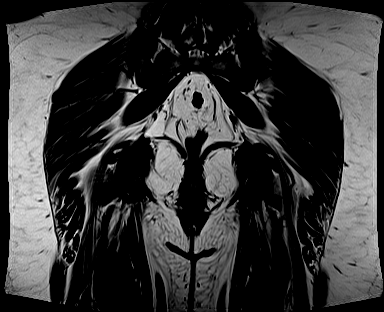
[im 16/32]
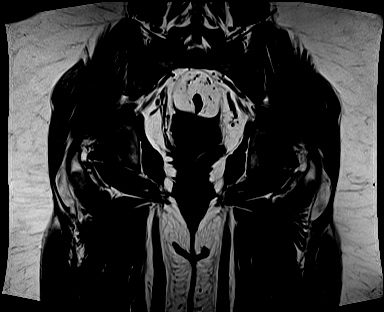
[im 20/32]
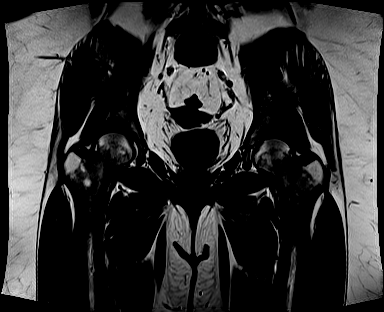
[im 24/32]
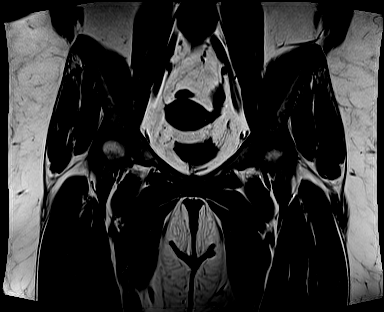
[im 28/32]
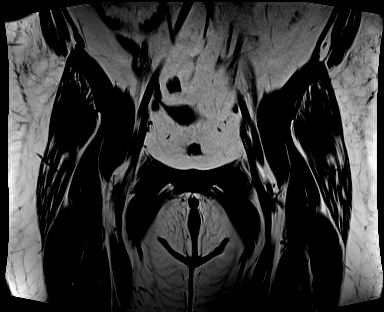
[im 32/32]
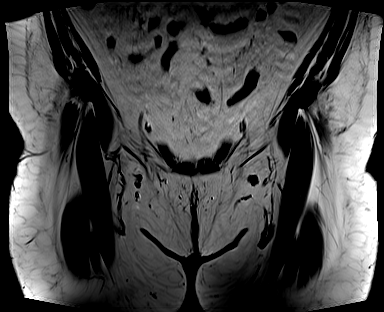

[Series 18: STIR · coronal · 4.0mm · 1.04mm/px · 8 of 32 slices shown]
[im 1/32]
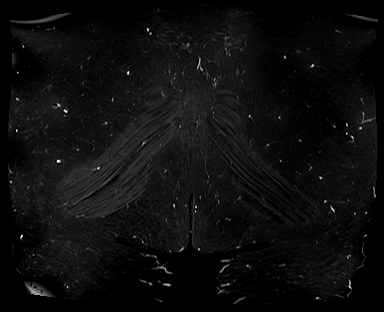
[im 5/32]
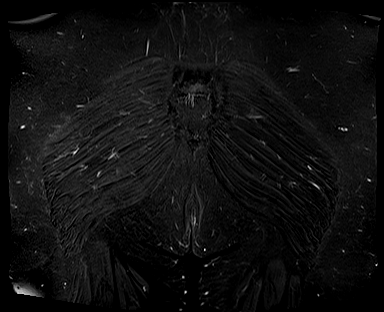
[im 9/32]
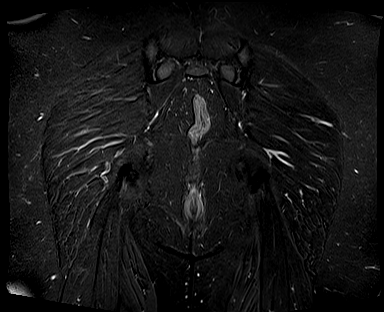
[im 14/32]
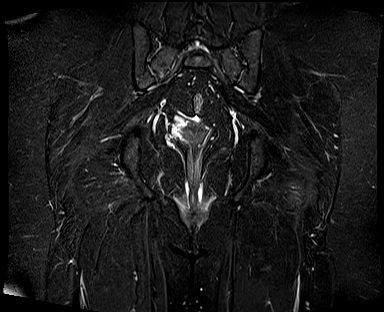
[im 18/32]
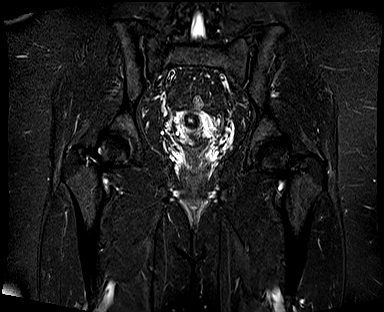
[im 23/32]
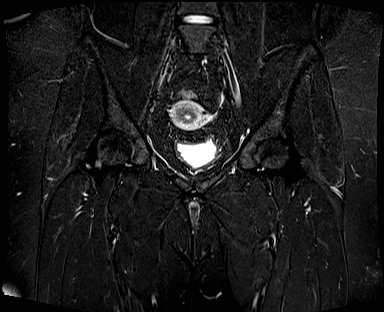
[im 27/32]
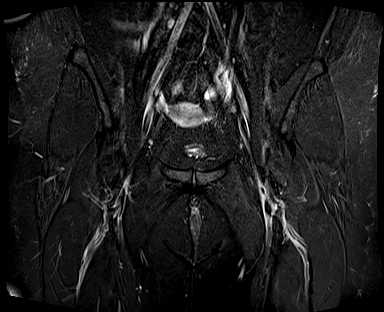
[im 32/32]
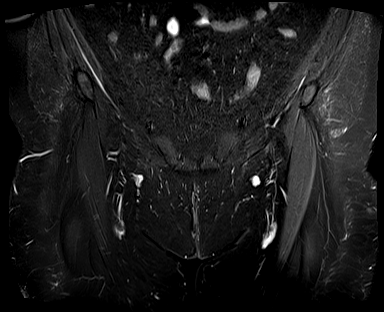

[Series 19: T1 · axial · 4.0mm · 0.74mm/px · z∈[-279,-74]mm · 8 of 42 slices shown (2 of 2)]
[im 1/42]
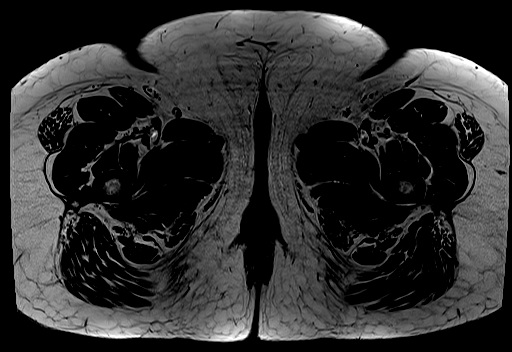
[im 5/42]
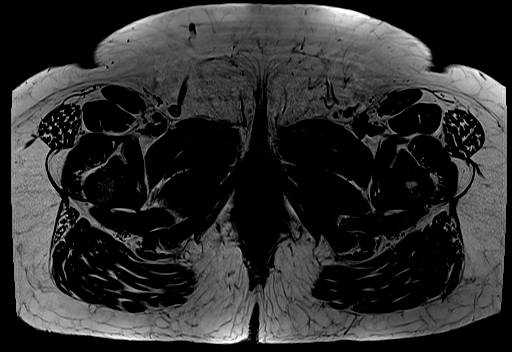
[im 14/42]
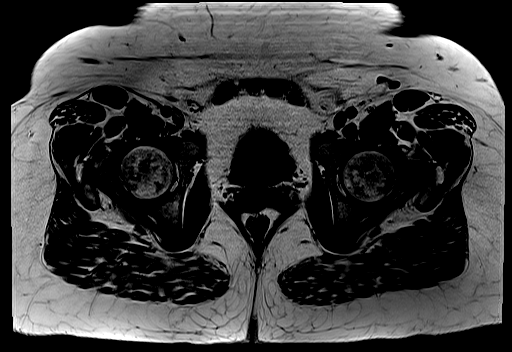
[im 19/42]
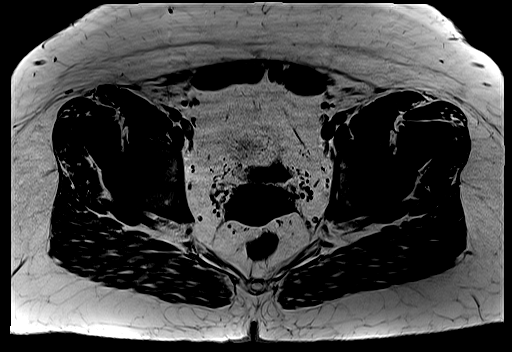
[im 23/42]
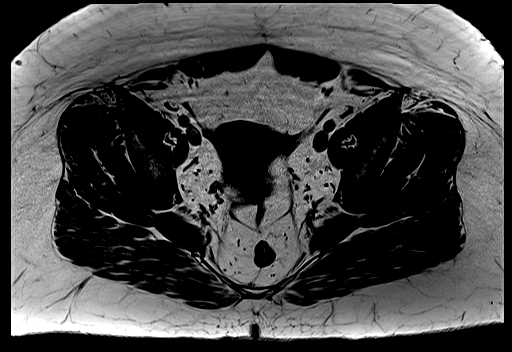
[im 28/42]
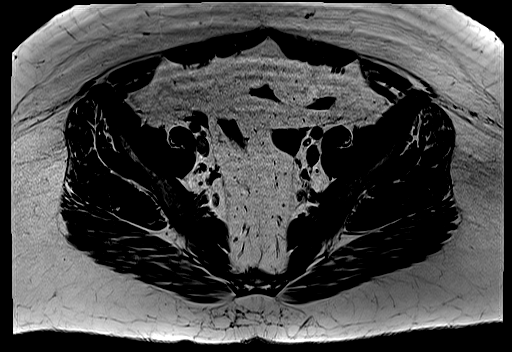
[im 37/42]
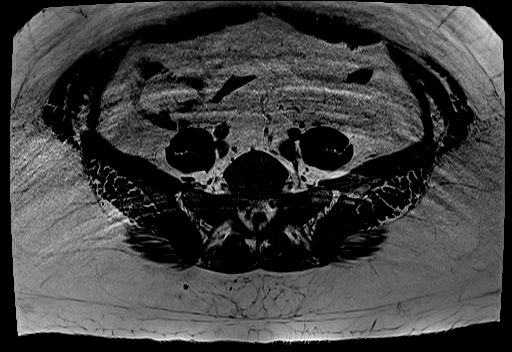
[im 42/42]
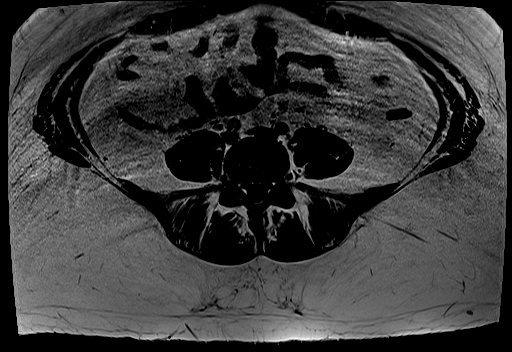

[Series 20: T2 fat-sat · axial · 4.0mm · 0.74mm/px · z∈[-279,-99]mm · 7 of 42 slices shown]
[im 1/42]
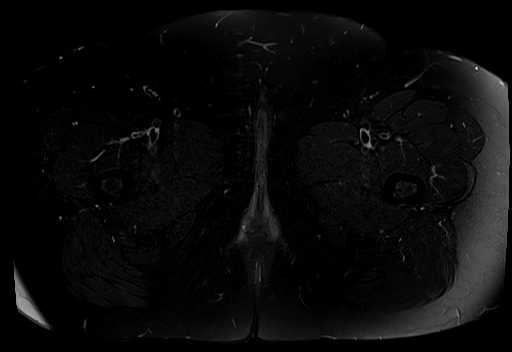
[im 5/42]
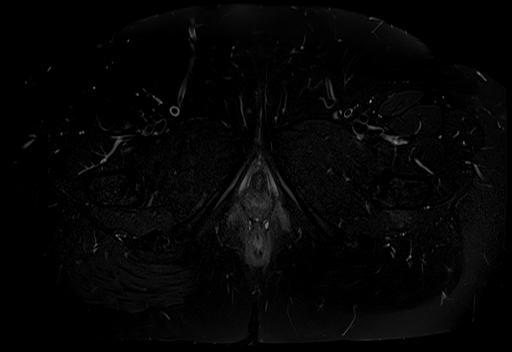
[im 14/42]
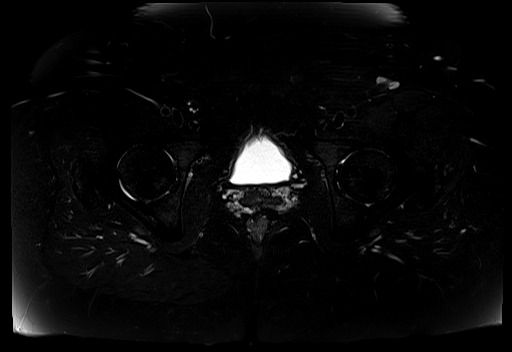
[im 19/42]
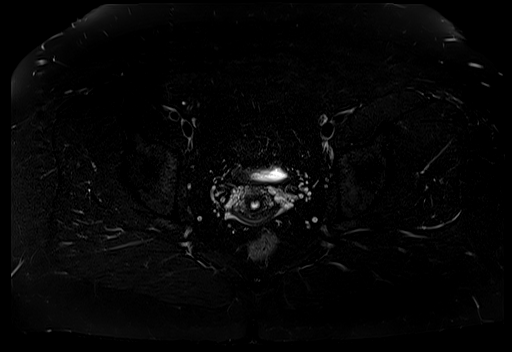
[im 23/42]
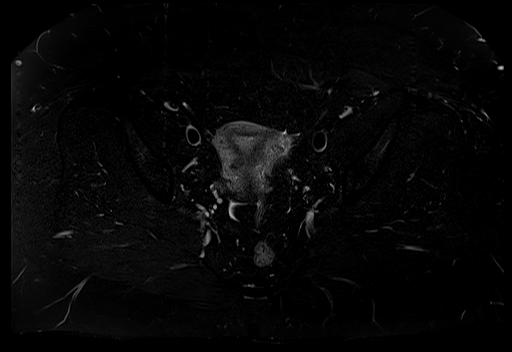
[im 28/42]
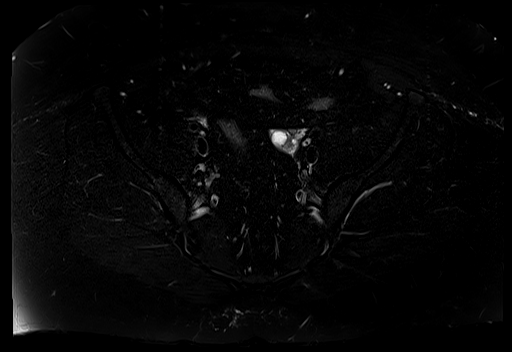
[im 37/42]
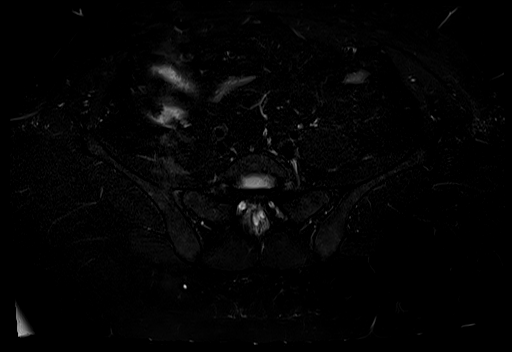

[32 of 48 positions shown; findings below may reference images not displayed]

FINDINGS: Bones/Joint/Cartilage

No acute fracture. No dislocation. No femoral head avascular
necrosis. Bilateral hip joints are intact. No significant
arthropathy. No discrete cartilage defect. No subchondral marrow
signal changes. No hip joint effusion. Bony pelvis intact without
diastasis. Minimal arthropathy involving the inferior aspect of the
right sacroiliac joint with mild subchondral marrow signal changes.
No erosion. No SI joint effusion. Left SI joint is unremarkable.
Pubic symphysis within normal limits. No bone marrow edema. Stable
benign bone island within the right femoral neck. No marrow
replacing bone lesion.

Ligaments

Intact.

Muscles and Tendons

The gluteal, hamstring, iliopsoas, rectus femoris, and adductor
tendons appear intact without tear or significant tendinosis. Normal
muscle bulk and signal intensity without edema, atrophy, or fatty
infiltration.

Soft tissues

No soft tissue edema or fluid collection. No inguinal
lymphadenopathy. No acute findings within the pelvis.
IMPRESSION: 1. Minimal arthropathy involving the right sacroiliac joint with
mild subchondral marrow signal changes. No erosion or joint effusion
to suggest sacroiliitis.
2. Otherwise unremarkable MRI of the pelvis.

## 2021-10-11 IMAGING — MR MR LUMBAR SPINE W/O CM
4 of 5 series · 26 of 48 positions shown · non-contrast
Comparison: Prior radiograph from [DATE].

CLINICAL DATA: Initial evaluation for low back pain with bilateral
leg pain, left greater than right.

EXAM:
MRI LUMBAR SPINE WITHOUT CONTRAST
TECHNIQUE: Multiplanar, multisequence MR imaging of the lumbar spine was
performed. No intravenous contrast was administered.

[Series 5: T2 · sagittal · 4.0mm · 0.73mm/px · 5 of 12 slices shown (1 of 2)]
[im 1/12]
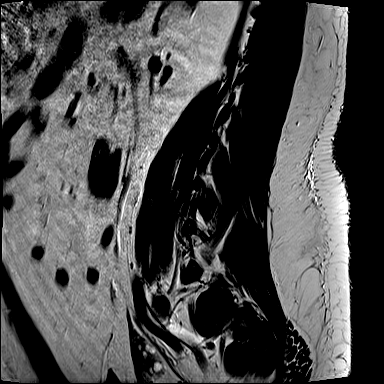
[im 3/12]
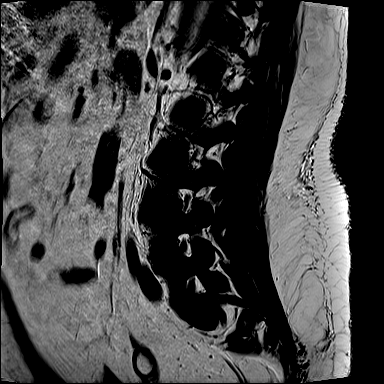
[im 6/12]
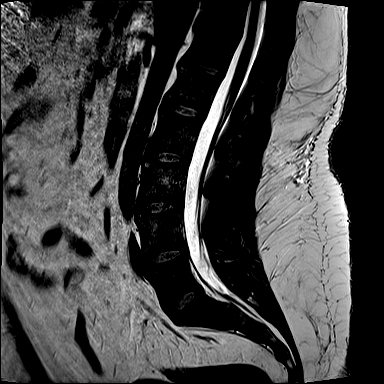
[im 9/12]
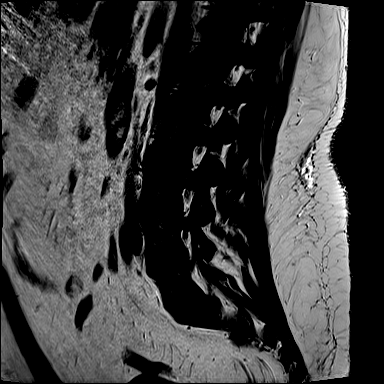
[im 12/12]
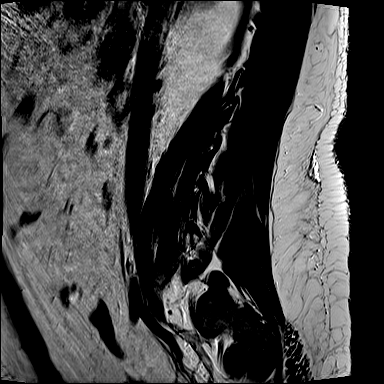

[Series 7: T1 · sagittal · 4.0mm · 0.88mm/px · 6 of 12 slices shown (1 of 2)]
[im 1/12]
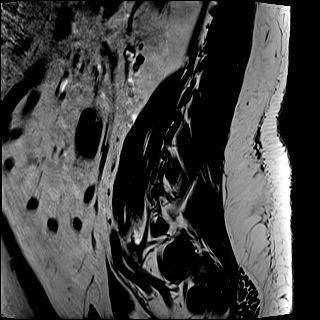
[im 3/12]
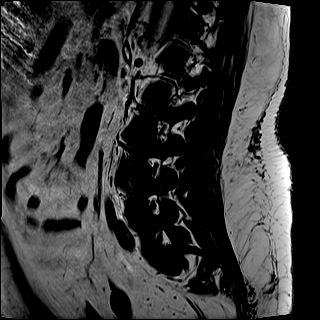
[im 5/12]
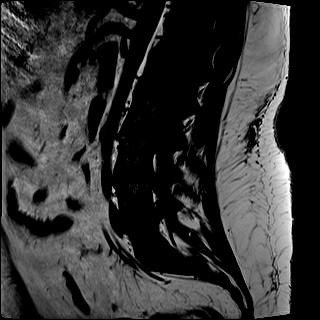
[im 7/12]
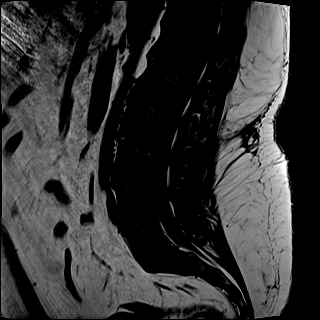
[im 9/12]
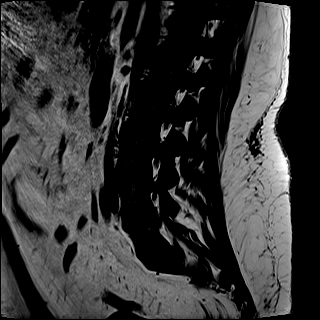
[im 12/12]
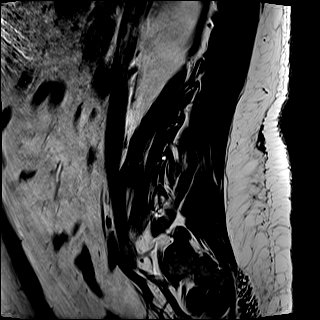

[Series 8: T2 · axial · 4.0mm · 0.57mm/px · z∈[-164,+52]mm · 10 of 34 slices shown (2 of 2)]
[im 3/34]
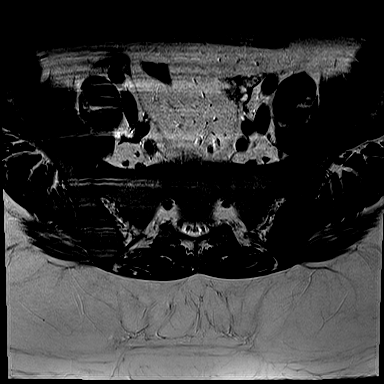
[im 5/34]
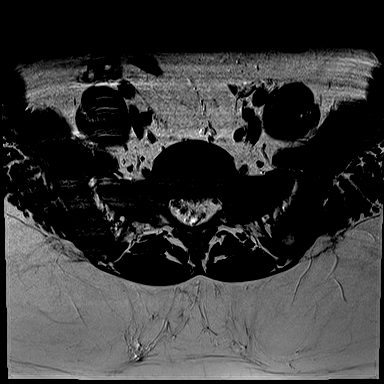
[im 7/34]
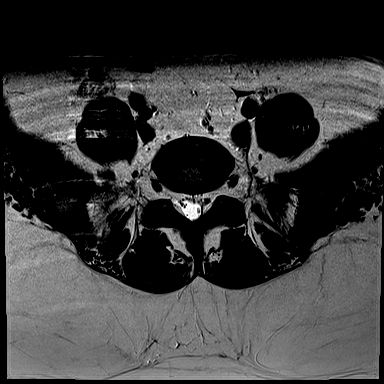
[im 12/34]
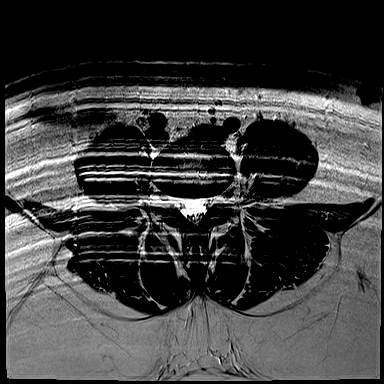
[im 16/34]
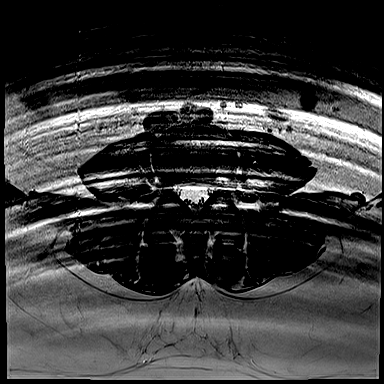
[im 18/34]
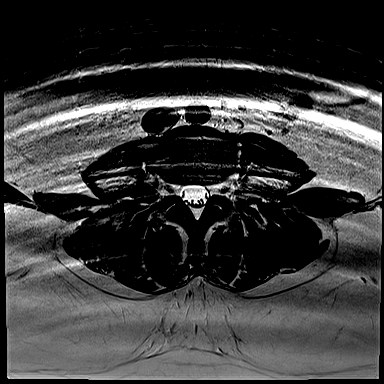
[im 20/34]
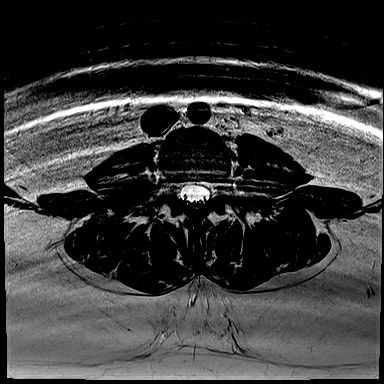
[im 25/34]
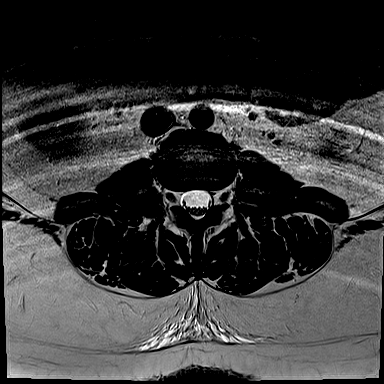
[im 29/34]
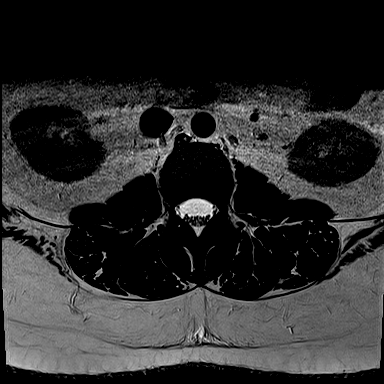
[im 34/34]
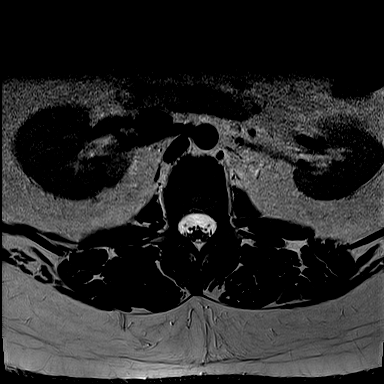

[Series 9: T1 · axial · 4.0mm · 0.34mm/px · z∈[-164,+28]mm · 5 of 34 slices shown (2 of 2)]
[im 3/34]
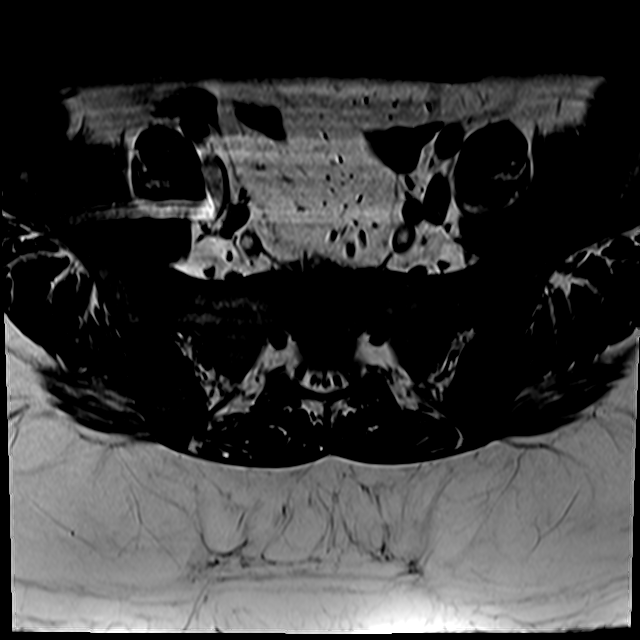
[im 5/34]
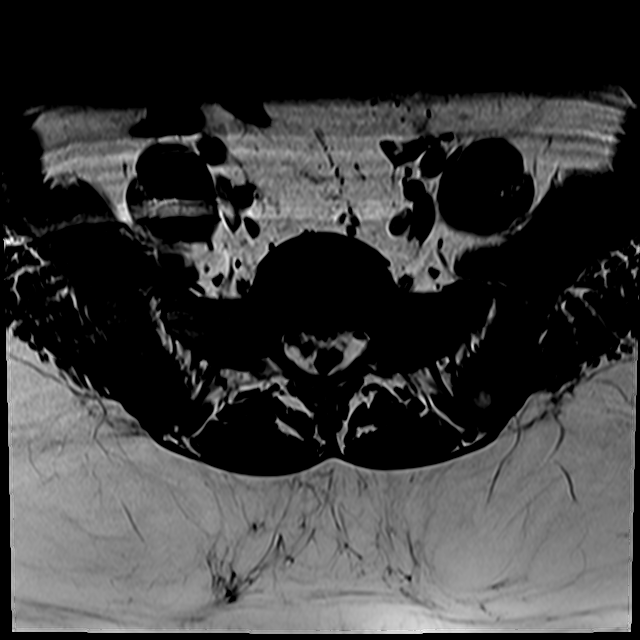
[im 7/34]
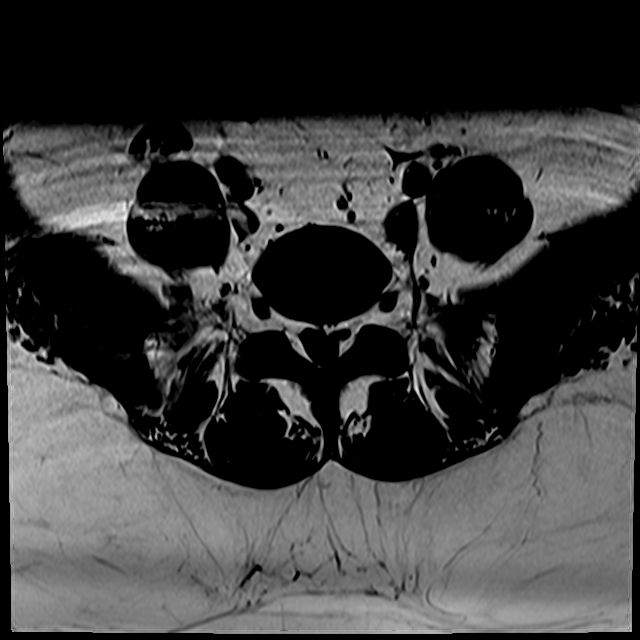
[im 18/34]
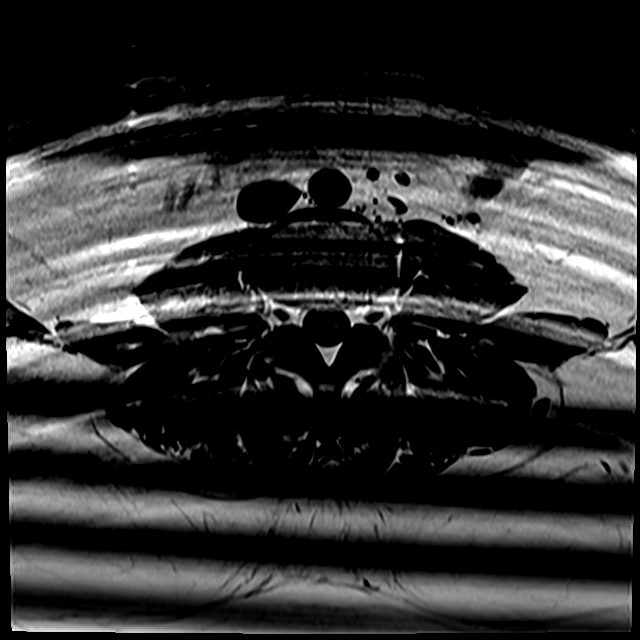
[im 29/34]
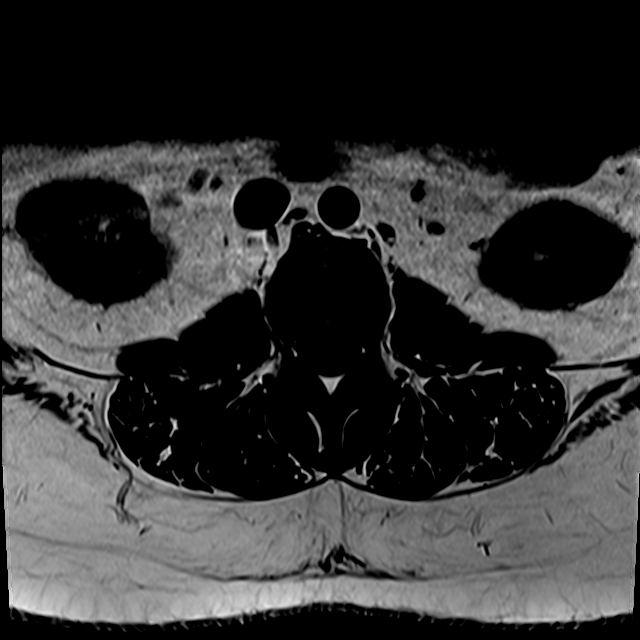

[26 of 48 positions shown; findings below may reference images not displayed]

FINDINGS: Segmentation: Standard. Lowest well-formed disc space labeled the
L5-S1 level.

Alignment: Physiologic with preservation of the normal lumbar
lordosis. No listhesis.

Vertebrae: Vertebral body height well maintained without acute or
chronic fracture. Bone marrow signal intensity within normal limits.
No discrete or worrisome osseous lesions or abnormal marrow edema.

Conus medullaris and cauda equina: Conus extends to the L1 level.
Conus and cauda equina appear normal.

Paraspinal and other soft tissues: Unremarkable.

Disc levels:

No significant disc pathology seen within the lumbar spine.
Intervertebral discs are well hydrated with preserved disc height.
No disc bulge or focal disc herniation. No significant facet
pathology. No canal or neural foraminal stenosis or evidence for
neural impingement.
IMPRESSION: Normal MRI of the lumbar spine. No significant disc pathology,
stenosis, or evidence for neural impingement.

## 2021-10-13 ENCOUNTER — Other Ambulatory Visit: Payer: Self-pay

## 2021-10-13 ENCOUNTER — Ambulatory Visit: Payer: BC Managed Care – PPO | Admitting: Orthopaedic Surgery

## 2021-10-13 ENCOUNTER — Encounter: Payer: Self-pay | Admitting: Orthopaedic Surgery

## 2021-10-13 VITALS — BP 140/88 | HR 100 | Ht 62.0 in | Wt 240.0 lb

## 2021-10-13 DIAGNOSIS — W010XXA Fall on same level from slipping, tripping and stumbling without subsequent striking against object, initial encounter: Secondary | ICD-10-CM

## 2021-10-13 DIAGNOSIS — M5442 Lumbago with sciatica, left side: Secondary | ICD-10-CM

## 2021-10-13 MED ORDER — METHOCARBAMOL 500 MG PO TABS: 500.0000 mg | ORAL_TABLET | Freq: Four times a day (QID) | ORAL | 1 refills | Status: DC

## 2021-10-13 NOTE — Patient Instructions (Addendum)
You have been referred to physical therapy in [place]. It is across from [employer] on [address]  For your back pain use Aspercreme, Biofreeze, Blue Emu or Voltaren Gel over the counter 2-3 times daily. Rub into area well each use for best results.   Follow-up in 1 week  Stay out of work until you see Dr.Keeling for your follow up.

## 2021-10-13 NOTE — Progress Notes (Signed)
Subjective:    Patient ID: Cathy Wells, female    DOB: 17-Jun-1991, 30 y.o.   MRN: [id number]  HPI She fell in the woods going down a incline and landed on her buttocks when her feet gave way on wet leaves.  This happened November 27.  She had pain in the lower back and buttock area.  She went to the ER early the next morning in the middle of the night complaining of lower back pain and left leg numbness.  X-rays were done and a MRI of back and pelvis were done, all negative.  I have reviewed the ER records.  I have independently reviewed the MRI.    I have independently reviewed and interpreted x-rays of this patient done at another site by another physician or qualified health professional.  She had continued pain. She went to [facility].  She has been on Robaxin and Tramadol and a prednisone pack with some help. She has numbness of the left foot at times.  Her back is still very tender.  She has been out of work the entire time.  She has pain sitting for a while.  She has pain after standing a while.  She has tried ice and heat also.    Review of Systems  Constitutional:  Positive for activity change.  Respiratory:  Positive for shortness of breath.   Musculoskeletal:  Positive for arthralgias, back pain and myalgias.  All other systems reviewed and are negative. For Review of Systems, all other systems reviewed and are negative.  The following is a summary of the past history medically, past history surgically, known current medicines, social history and family history.  This information is gathered electronically by the computer from prior information and documentation.  I review this each visit and have found including this information at this point in the chart is beneficial and informative.   Past Medical History:  Diagnosis Date   Complication of anesthesia    PONV   Frequency of urination    GERD (gastroesophageal reflux disease)    Hematuria    Left ureteral [place]     Mild asthma    Ovarian cyst    Urgency of urination    Wears contact lenses     Past Surgical History:  Procedure Laterality Date   APPENDECTOMY  07/25/2006   OPEN   URETEROSCOPY WITH HOLMIUM LASER LITHOTRIPSY Left 07/21/2017   Procedure: retrogrades, URETEROSCOPY WITH [place] basketry, stent placement;  Surgeon: Ardis Hughs, MD;  Location: [facility];  Service: Urology;  Laterality: Left;    Current Outpatient Medications on File Prior to Visit  Medication Sig Dispense Refill   albuterol (PROVENTIL HFA;VENTOLIN HFA) 108 (90 Base) MCG/ACT inhaler Inhale 1-2 puffs into the lungs every 6 (six) hours as needed for wheezing or shortness of breath.     ALPRAZolam (XANAX) 0.5 MG tablet Take 0.5 mg by mouth 2 (two) times daily as needed for anxiety.     citalopram (CELEXA) 10 MG tablet Take 10 mg by mouth daily.     lidocaine (LIDODERM) 5 % Place 1 patch onto the skin daily. Remove & Discard patch within 12 hours or as directed by MD 5 patch 0   olmesartan-hydrochlorothiazide (BENICAR HCT) 40-25 MG tablet Take 1 tablet by mouth daily.     pantoprazole (PROTONIX) 40 MG tablet Take 40 mg by mouth daily.     traMADol (ULTRAM) 50 MG tablet Take 50 mg by mouth every 6 (six)  hours as needed.     naproxen (NAPROSYN) 500 MG tablet Take 1 tablet (500 mg total) by mouth 2 (two) times daily. (Patient not taking: Reported on 10/13/2021) 30 tablet 0   No current facility-administered medications on file prior to visit.    Social History   Socioeconomic History   Marital status: Married    Spouse name: Not on file   Number of children: Not on file   Years of education: Not on file   Highest education level: Not on file  Occupational History   Not on file  Tobacco Use   Smoking status: Never   Smokeless tobacco: Never  Vaping Use   Vaping Use: Never used  Substance and Sexual Activity   Alcohol use: No   Drug use: No   Sexual activity: Yes    Birth  control/protection: None  Other Topics Concern   Not on file  Social History Narrative   Not on file   Social Determinants of Health   Financial Resource Strain: Not on file  Food Insecurity: Not on file  Transportation Needs: Not on file  Physical Activity: Not on file  Stress: Not on file  Social Connections: Not on file  Intimate Partner Violence: Not on file    Family History  Problem Relation Age of Onset   Hypertension Mother    Asthma Father    Hypertension Father    Hypertension Maternal Grandfather    Diabetes Paternal Grandmother    Hypertension Paternal Grandmother    COPD Paternal Grandfather     BP 140/88    Pulse 100    Ht 5\' 2"  (1.575 m)    Wt 240 lb (108.9 kg)    BMI 43.90 kg/m   Body mass index is 43.9 kg/m.     Objective:   Physical Exam Vitals and nursing note reviewed. Exam conducted with a chaperone present.  Constitutional:      Appearance: She is well-developed.  HENT:     Head: Normocephalic and atraumatic.  Eyes:     Conjunctiva/sclera: Conjunctivae normal.     Pupils: Pupils are equal, round, and reactive to light.  Cardiovascular:     Rate and Rhythm: Normal rate and regular rhythm.  Pulmonary:     Effort: Pulmonary effort is normal.  Abdominal:     Palpations: Abdomen is soft.  Musculoskeletal:     Cervical back: Normal range of motion and neck supple.       Back:  Skin:    General: Skin is warm and dry.  Neurological:     Mental Status: She is alert and oriented to person, place, and time.     Cranial Nerves: No cranial nerve deficit.     Motor: No abnormal muscle tone.     Coordination: Coordination normal.     Deep Tendon Reflexes: Reflexes are normal and symmetric. Reflexes normal.  Psychiatric:        Behavior: Behavior normal.        Thought Content: Thought content normal.        Judgment: Judgment normal.          Assessment & Plan:   Encounter Diagnosis  Name Primary?   Acute midline low back pain with  left-sided sciatica Yes   I will begin PT.  I will refill her Robaxin.  She is to resume her Naprosyn.  She is to stay out of work.  Return in one week.  Call if any problem.  Precautions discussed.  Electronically Signed Sanjuana Kava, MD 12/13/202210:05 AM

## 2021-10-20 ENCOUNTER — Other Ambulatory Visit: Payer: Self-pay

## 2021-10-20 ENCOUNTER — Encounter: Payer: Self-pay | Admitting: Orthopaedic Surgery

## 2021-10-20 ENCOUNTER — Ambulatory Visit (INDEPENDENT_AMBULATORY_CARE_PROVIDER_SITE_OTHER): Payer: BC Managed Care – PPO | Admitting: Orthopaedic Surgery

## 2021-10-20 DIAGNOSIS — M5442 Lumbago with sciatica, left side: Secondary | ICD-10-CM | POA: Diagnosis not present

## 2021-10-20 NOTE — Progress Notes (Signed)
Virtual Visit via Telephone Note  I connected withNAME@ on 10/20/21 at  9:20 AM EST by telephone and verified that I am speaking with the correct person using two identifiers.  Location: Patient: home Provider: office   I discussed the limitations, risks, security and privacy concerns of performing an evaluation and management service by telephone and the availability of in person appointments. I also discussed with the patient that there may be a patient responsible charge related to this service. The patient expressed understanding and agreed to proceed.   History of Present Illness: She could not get PT until tomorrow.  She still has lower back pain.  She has spasm in the back and left left numb at times.She has used Aspercreme.  She is sick today with GI problem.     Observations/Objective: Per above.  Assessment and Plan: Encounter Diagnosis  Name Primary?   Acute midline low back pain with left-sided sciatica Yes      Follow Up Instructions: Go to PT.  See in office in two weeks.  Stay out of work.   I discussed the assessment and treatment plan with the patient. The patient was provided an opportunity to ask questions and all were answered. The patient agreed with the plan and demonstrated an understanding of the instructions.   The patient was advised to call back or seek an in-person evaluation if the symptoms worsen or if the condition fails to improve as anticipated.  I provided 8 minutes of non-face-to-face time during this encounter.   Darreld Mclean, MD

## 2021-10-20 NOTE — Patient Instructions (Signed)
Out of work until seen

## 2021-10-21 ENCOUNTER — Encounter (HOSPITAL_COMMUNITY): Payer: Self-pay | Admitting: Physical Therapy

## 2021-10-21 ENCOUNTER — Ambulatory Visit (HOSPITAL_COMMUNITY): Payer: BC Managed Care – PPO | Attending: Orthopaedic Surgery | Admitting: Physical Therapy

## 2021-10-21 DIAGNOSIS — M5442 Lumbago with sciatica, left side: Secondary | ICD-10-CM | POA: Diagnosis not present

## 2021-10-21 DIAGNOSIS — M545 Low back pain, unspecified: Secondary | ICD-10-CM | POA: Insufficient documentation

## 2021-10-21 NOTE — Therapy (Signed)
Cathy Wells, [place], [address] Phone: [phone]   Fax:  [phone]  Physical Therapy Evaluation  Patient Details  Name: Cathy Wells MRN: [id number] Date of Birth: 07-Oct-1991 Referring Provider (PT): Dr Cathy Wells   Encounter Date: 10/21/2021   PT End of Session - 10/21/21 1338     Visit Number 1    Number of Visits 8    Date for PT Re-Evaluation 11/18/21    Authorization Type BCBS COMM PPO (30 VL)    Authorization - Visit Number 1    Authorization - Number of Visits 30    PT Start Time [id number]    PT Stop Time 1345    PT Time Calculation (min) 40 min    Activity Tolerance Patient tolerated treatment well    Behavior During Therapy [name] for tasks assessed/performed             Past Medical History:  Diagnosis Date   Complication of anesthesia    PONV   Frequency of urination    GERD (gastroesophageal reflux disease)    Hematuria    Left ureteral [place]    Mild asthma    Ovarian cyst    Urgency of urination    Wears contact lenses     Past Surgical History:  Procedure Laterality Date   APPENDECTOMY  07/25/2006   OPEN   URETEROSCOPY WITH HOLMIUM LASER LITHOTRIPSY Left 07/21/2017   Procedure: retrogrades, URETEROSCOPY WITH [place] basketry, stent placement;  Surgeon: Ardis Hughs, MD;  Location: [facility];  Service: Urology;  Laterality: Left;    There were no vitals filed for this visit.    Subjective Assessment - 10/21/21 1314     Subjective Patient presents to therapy with complaint of LBP with LT side sciatica beginning 09/27/21 after a fall. She had xrays and MRI which looked fine. She suspects some inflammation and maybe a muscle strain. She is currently taking anti inflammatory.  Symptoms have improved some since onset.    Limitations Lifting;Standing;Walking    Diagnostic tests xray, MRI    Patient Stated Goals Decreased pain and sciatica    Currently in Pain?  No/denies   3/10 at worst               [facility] PT Assessment - 10/21/21 0001       Assessment   Medical Diagnosis LBP    Referring Provider (PT) Dr Cathy Wells    Onset Date/Surgical Date 09/27/21    Next MD Visit 11/03/21    Prior Therapy Yes      Precautions   Precautions None      Restrictions   Weight Bearing Restrictions No      Balance Screen   Has the patient fallen in the past 6 months Yes    How many times? 1    Has the patient had a decrease in activity level because of a fear of falling?  No    Is the patient reluctant to leave their home because of a fear of falling?  No      Home Ecologist residence      Prior Function   Level of Independence Independent      Cognition   Overall Cognitive Status Within Functional Limits for tasks assessed      Observation/Other Assessments   Focus on Therapeutic Outcomes (FOTO)  47% function      ROM / Strength   AROM /  PROM / Strength AROM;Strength      AROM   AROM Assessment Site Lumbar    Lumbar Flexion 50% limited    Lumbar Extension 25% limited    Lumbar - Right Side Bend WNL    Lumbar - Left Side Bend WNL but increased pain      Strength   Strength Assessment Site Hip;Knee    Right/Left Hip Right;Left    Right Hip Flexion 5/5    Right Hip Extension 4/5   pain   Right Hip ABduction 4+/5    Left Hip Flexion 4+/5    Left Hip Extension 4/5   pain   Left Hip ABduction 4+/5    Right/Left Knee Right;Left    Right Knee Extension 5/5    Left Knee Extension 5/5      Palpation   Palpation comment increased TTP about sacrum      Special Tests   Other special tests (+) sacral compression                        Objective measurements completed on examination: See above findings.       [facility] Adult PT Treatment/Exercise - 10/21/21 0001       Exercises   Exercises Lumbar      Lumbar Exercises: Supine   Ab Set 5 reps    Other Supine Lumbar Exercises glute set  x5, clamshell (supine) x 5                     PT Education - 10/21/21 1315     Education Details on evaluation findings, POC and HEP    Person(s) Educated Patient    Methods Explanation;Handout    Comprehension Verbalized understanding              PT Short Term Goals - 10/21/21 1349       PT SHORT TERM GOAL #1   Title Patient will be independent with initial HEP and self-management strategies to improve functional outcomes    Time 2    Period Weeks    Status New    Target Date 11/04/21               PT Long Term Goals - 10/21/21 1358       PT LONG TERM GOAL #1   Title Patient will have equal to or > 4+/5 MMT bilateral hip extension to improve ability to perform functional mobility, stair ambulation and ADLs.    Time 4    Period Weeks    Status New    Target Date 11/18/21      PT LONG TERM GOAL #2   Title Patient will report at least 75% overall improvement in subjective complaint to indicate improvement in ability to perform ADLs.    Time 4    Period Weeks    Status New    Target Date 11/18/21      PT LONG TERM GOAL #3   Title Patient will improve FOTO score to predicted value to indicate improvement in functional outcomes    Time 4    Period Weeks    Status New    Target Date 11/18/21      PT LONG TERM GOAL #4   Title Patient will be independent with advanced HEP and self-management strategies to improve functional outcomes    Time 4    Period Weeks    Status New    Target Date 11/18/21  Plan - 10/21/21 1343     Clinical Impression Statement Patient is a 30 y.o. female who presents to physical therapy with complaint of LBP. Patient demonstrates decreased strength, ROM restriction, and increased tenderness to palpation which are likely contributing to symptoms of pain and are negatively impacting patient ability to perform ADLs and functional mobility tasks. Patient will benefit from skilled physical therapy  services to address these deficits to reduce pain, improve level of function with ADLs and functional mobility tasks.    Examination-Activity Limitations Lift;Stand;Locomotion Level;Transfers;Squat    Examination-Participation Restrictions Cleaning;Laundry;Shop;Occupation;Community Activity;Yard Work    Stability/Clinical Decision Making Stable/Uncomplicated    Designer, jewellery Low    Rehab Potential Good    PT Frequency 2x / week    PT Duration 4 weeks    PT Treatment/Interventions ADLs/Self Care Home Management;Biofeedback;Traction;Moist Heat;Iontophoresis 4mg /ml Dexamethasone;Gait training;DME Instruction;Balance training;Neuromuscular re-education;Scar mobilization;Passive range of motion;Visual/perceptual remediation/compensation;Dry needling;Manual techniques;Functional mobility training;Stair training;Ultrasound;Cryotherapy;Parrafin;Fluidtherapy;Electrical Stimulation;Contrast Bath;Therapeutic exercise;Therapeutic activities;Patient/family education;Manual lymph drainage;Energy conservation;Spinal Manipulations;Splinting;Joint Manipulations;Taping;Orthotic Fit/Training;Vasopneumatic Device;Compression bandaging    PT Next Visit Plan Review HEP and progress glute strength and lumbar mobility as tolerated. STM or IASTM to sacral ligamentous complex for pain    PT Home Exercise Plan Eval: glute set, ab set, supine clam    Consulted and Agree with Plan of Care Patient             Patient will benefit from skilled therapeutic intervention in order to improve the following deficits and impairments:  Abnormal gait, Increased fascial restricitons, Impaired sensation, Pain, Improper body mechanics, Decreased mobility, Increased muscle spasms, Decreased activity tolerance, Decreased range of motion, Decreased strength, Impaired flexibility  Visit Diagnosis: Low back pain, unspecified back pain laterality, unspecified chronicity, unspecified whether sciatica present     Problem  List Patient Active Problem List   Diagnosis Date Noted   Laceration of head and neck 10/11/2020   Irregular periods 12/14/2016   RLQ abdominal pain 11/30/2016   Missed periods 11/30/2016   Right ovarian cyst 11/30/2016   Urinary frequency 05/27/2015   Pelvic pain in female 05/27/2015   UTI (lower urinary tract infection) 04/28/2015   2:05 PM, 10/21/21 Josue Hector PT DPT  Physical Therapist with [facility]  (336) [address] [place], [place], [address] Phone: [phone]   Fax:  [phone]  Name: Cathy Wells MRN: [id number] Date of Birth: 1991/09/30

## 2021-10-21 NOTE — Patient Instructions (Signed)
Access Code: [email or web address] URL: [email or web address] Date: 10/21/2021 Prepared by: Georges Lynch  Exercises Supine Gluteal Sets - 2-3 x daily - 7 x weekly - 2 sets - 10 reps - 5 second hold Hooklying Clamshell with Resistance - 2-3 x daily - 7 x weekly - 2 sets - 10 reps - 5 second hold Supine Transversus Abdominis Bracing - Hands on Ground - 2-3 x daily - 7 x weekly - 2 sets - 10 reps - 5 second hold

## 2021-10-22 ENCOUNTER — Telehealth (HOSPITAL_COMMUNITY): Payer: Self-pay

## 2021-10-22 ENCOUNTER — Ambulatory Visit (HOSPITAL_COMMUNITY): Payer: BC Managed Care – PPO

## 2021-10-22 NOTE — Telephone Encounter (Signed)
No show, called and left message concerning missed apt today.  Reminded next apt date and time with contact number included if needs to cancel/reschedule apts in the future.    Becky Sax, LPTA/CLT; [place] [phone]

## 2021-10-29 ENCOUNTER — Encounter (HOSPITAL_COMMUNITY): Payer: Self-pay | Admitting: Physical Therapy

## 2021-10-29 ENCOUNTER — Ambulatory Visit (HOSPITAL_COMMUNITY): Payer: BC Managed Care – PPO | Admitting: Physical Therapy

## 2021-10-29 ENCOUNTER — Other Ambulatory Visit: Payer: Self-pay

## 2021-10-29 DIAGNOSIS — M545 Low back pain, unspecified: Secondary | ICD-10-CM | POA: Diagnosis not present

## 2021-10-29 DIAGNOSIS — M5442 Lumbago with sciatica, left side: Secondary | ICD-10-CM | POA: Diagnosis not present

## 2021-10-29 NOTE — Therapy (Signed)
[facility] [address] [place], [place], [address] Phone: [phone]   Fax:  [phone]  Physical Therapy Treatment  Patient Details  Name: Cathy Wells MRN: [id number] Date of Birth: May 27, 1991 Referring Provider (PT): Dr Hilda Lias   Encounter Date: 10/29/2021   PT End of Session - 10/29/21 1444     Visit Number 2    Number of Visits 8    Date for PT Re-Evaluation 11/18/21    Authorization Type BCBS COMM PPO (30 VL)    Authorization - Visit Number 2    Authorization - Number of Visits 30    Progress Note Due on Visit 8    PT Start Time 1410   Pt is late   PT Stop Time 1440    PT Time Calculation (min) 30 min    Activity Tolerance Patient tolerated treatment well    Behavior During Therapy [facility] for tasks assessed/performed             Past Medical History:  Diagnosis Date   Complication of anesthesia    PONV   Frequency of urination    GERD (gastroesophageal reflux disease)    Hematuria    Left ureteral [place]    Mild asthma    Ovarian cyst    Urgency of urination    Wears contact lenses     Past Surgical History:  Procedure Laterality Date   APPENDECTOMY  07/25/2006   OPEN   URETEROSCOPY WITH HOLMIUM LASER LITHOTRIPSY Left 07/21/2017   Procedure: retrogrades, URETEROSCOPY WITH [place] basketry, stent placement;  Surgeon: Crist Fat, MD;  Location: [facility];  Service: Urology;  Laterality: Left;    There were no vitals filed for this visit.   Subjective Assessment - 10/29/21 1411     Subjective Pt states she has been doing the exercises. Her back is not quite as bad she only has pain when she moves a certain way which can go up to a 4/10 .    Limitations Lifting;Standing;Walking    Diagnostic tests xray, MRI    Currently in Pain? No/denies                               [facility] Adult PT Treatment/Exercise - 10/29/21 0001       Exercises   Exercises Lumbar       Lumbar Exercises: Standing   Scapular Retraction Both;10 reps;Theraband    Theraband Level (Scapular Retraction) Level 3 (Green)    Row Strengthening;Both;10 reps;Theraband    Theraband Level (Row) Level 3 (Green)    Shoulder Extension Strengthening;Both;10 reps;Theraband    Theraband Level (Shoulder Extension) Level 3 (Green)      Lumbar Exercises: Supine   Ab Set 5 reps    Bridge 5 reps;Limitations    Bridge Limitations x2    Other Supine Lumbar Exercises isometric hip ab/adduction 5x x 3 reps      Lumbar Exercises: Sidelying   Clam 10 reps      Manual Therapy   Manual Therapy Muscle Energy Technique    Manual therapy comments completed seperate from all other aspects of treatment    Muscle Energy Technique Anterior rotation of Lt SI                       PT Short Term Goals - 10/29/21 1434       PT SHORT TERM GOAL #  1   Title Patient will be independent with initial HEP and self-management strategies to improve functional outcomes    Time 2    Period Weeks    Status On-going    Target Date 11/04/21               PT Long Term Goals - 10/29/21 1435       PT LONG TERM GOAL #1   Title Patient will have equal to or > 4+/5 MMT bilateral hip extension to improve ability to perform functional mobility, stair ambulation and ADLs.    Time 4    Status Achieved      PT LONG TERM GOAL #2   Title Patient will report at least 75% overall improvement in subjective complaint to indicate improvement in ability to perform ADLs.    Time 4    Period Weeks    Status On-going      PT LONG TERM GOAL #3   Title Patient will improve FOTO score to predicted value to indicate improvement in functional outcomes    Time 4    Period Weeks    Status On-going                   Plan - 10/29/21 1445     Clinical Impression Statement Evaluation and goals reviewed with pt.  SI checked with noted posterior rotation of Lt; therapist completed mm energy  techniques to correct with good alignment noted following technique.  Educated pt on proper technique for bed mobility    Examination-Activity Limitations Lift;Stand;Locomotion Level;Transfers;Squat    Examination-Participation Restrictions Cleaning;Laundry;Shop;Occupation;Community Activity;Yard Work    Stability/Clinical Decision Making Stable/Uncomplicated    Rehab Potential Good    PT Frequency 2x / week    PT Duration 4 weeks    PT Treatment/Interventions ADLs/Self Care Home Management;Biofeedback;Traction;Moist Heat;Iontophoresis 4mg /ml Dexamethasone;Gait training;DME Instruction;Balance training;Neuromuscular re-education;Scar mobilization;Passive range of motion;Visual/perceptual remediation/compensation;Dry needling;Manual techniques;Functional mobility training;Stair training;Ultrasound;Cryotherapy;Parrafin;Fluidtherapy;Electrical Stimulation;Contrast Bath;Therapeutic exercise;Therapeutic activities;Patient/family education;Manual lymph drainage;Energy conservation;Spinal Manipulations;Splinting;Joint Manipulations;Taping;Orthotic Fit/Training;Vasopneumatic Device;Compression bandaging    PT Next Visit Plan Assess how pt did following mm energy correction.  Continue to work on stab.    PT Home Exercise Plan Eval: glute set, ab set, supine clam; 12/29: bridge, isometric hip ab/adduction, SL clam    Consulted and Agree with Plan of Care Patient             Patient will benefit from skilled therapeutic intervention in order to improve the following deficits and impairments:  Abnormal gait, Increased fascial restricitons, Impaired sensation, Pain, Improper body mechanics, Decreased mobility, Increased muscle spasms, Decreased activity tolerance, Decreased range of motion, Decreased strength, Impaired flexibility  Visit Diagnosis: Low back pain, unspecified back pain laterality, unspecified chronicity, unspecified whether sciatica present     Problem List Patient Active Problem List    Diagnosis Date Noted   Laceration of head and neck 10/11/2020   Irregular periods 12/14/2016   RLQ abdominal pain 11/30/2016   Missed periods 11/30/2016   Right ovarian cyst 11/30/2016   Urinary frequency 05/27/2015   Pelvic pain in female 05/27/2015   UTI (lower urinary tract infection) 04/28/2015   Rayetta Humphrey, PT CLT [phone]  10/29/2021, 2:49 PM  [facility] [address] [place], [place], [address] Phone: [phone]   Fax:  [phone]  Name: Cathy Wells MRN: [id number] Date of Birth: [birth year]

## 2021-11-03 ENCOUNTER — Encounter (HOSPITAL_COMMUNITY): Payer: Self-pay | Admitting: Physical Therapy

## 2021-11-03 ENCOUNTER — Encounter: Payer: Self-pay | Admitting: Orthopaedic Surgery

## 2021-11-03 ENCOUNTER — Ambulatory Visit (HOSPITAL_COMMUNITY): Payer: BC Managed Care – PPO | Attending: Orthopaedic Surgery | Admitting: Physical Therapy

## 2021-11-03 ENCOUNTER — Other Ambulatory Visit: Payer: Self-pay

## 2021-11-03 ENCOUNTER — Ambulatory Visit: Payer: BC Managed Care – PPO | Admitting: Orthopaedic Surgery

## 2021-11-03 VITALS — BP 147/99 | HR 102 | Ht 62.0 in | Wt 243.8 lb

## 2021-11-03 DIAGNOSIS — M545 Low back pain, unspecified: Secondary | ICD-10-CM | POA: Diagnosis not present

## 2021-11-03 DIAGNOSIS — M5442 Lumbago with sciatica, left side: Secondary | ICD-10-CM | POA: Diagnosis not present

## 2021-11-03 MED ORDER — TRAMADOL HCL 50 MG PO TABS: 50.0000 mg | ORAL_TABLET | Freq: Four times a day (QID) | ORAL | 1 refills | Status: DC | PRN

## 2021-11-03 NOTE — Therapy (Signed)
[place], [place], [address] Phone: [phone]   Fax:  [phone]  Physical Therapy Treatment  Patient Details  Name: Cathy Wells MRN: [id number] Date of Birth: 1991/08/07 Referring Provider (PT): Dr Luna Glasgow   Encounter Date: 11/03/2021   PT End of Session - 11/03/21 0857     Visit Number 3    Number of Visits 8    Date for PT Re-Evaluation 11/18/21    Authorization Type BCBS COMM PPO (30 VL)    Authorization - Visit Number 3    Authorization - Number of Visits 30    Progress Note Due on Visit 8    PT Start Time 0840   pt late   PT Stop Time 0920    PT Time Calculation (min) 40 min    Activity Tolerance Patient tolerated treatment well    Behavior During Therapy [facility] for tasks assessed/performed             Past Medical History:  Diagnosis Date   Complication of anesthesia    PONV   Frequency of urination    GERD (gastroesophageal reflux disease)    Hematuria    Left ureteral [place]    Mild asthma    Ovarian cyst    Urgency of urination    Wears contact lenses     Past Surgical History:  Procedure Laterality Date   APPENDECTOMY  07/25/2006   OPEN   URETEROSCOPY WITH HOLMIUM LASER LITHOTRIPSY Left 07/21/2017   Procedure: retrogrades, URETEROSCOPY WITH [place] basketry, stent placement;  Surgeon: Ardis Hughs, MD;  Location: [facility];  Service: Urology;  Laterality: Left;    There were no vitals filed for this visit.   Subjective Assessment - 11/03/21 0845     Subjective Pt states that she is still having pain. States most her pain occurs after sitting for greater than five minutes.  She is doing her exercises.    Limitations Lifting;Standing;Walking    Diagnostic tests xray, MRI    Patient Stated Goals Decreased pain and sciatica    Currently in Pain? Yes    Pain Score 5     Pain Location Back    Pain Orientation Right    Pain Descriptors / Indicators Aching     Pain Type Acute pain                               [facility] Adult PT Treatment/Exercise - 11/03/21 0001       Exercises   Exercises Lumbar      Lumbar Exercises: Stretches   Single Knee to Chest Stretch 3 reps;30 seconds    Lower Trunk Rotation 5 reps      Lumbar Exercises: Standing   Heel Raises 10 reps    Functional Squats 10 reps    Scapular Retraction Both;Theraband;15 reps    [employer];Theraband;15 reps    Shoulder Extension Strengthening;Both;Theraband;15 reps      Lumbar Exercises: Seated   Sit to Stand 10 reps      Lumbar Exercises: Supine   Bridge 10 reps    Isometric Hip Flexion 10 reps    Other Supine Lumbar Exercises isometric hip ab/adduction x10      Lumbar Exercises: Sidelying   Clam 10 reps    Hip Abduction 10 reps  PT Short Term Goals - 10/29/21 1434       PT SHORT TERM GOAL #1   Title Patient will be independent with initial HEP and self-management strategies to improve functional outcomes    Time 2    Period Weeks    Status On-going    Target Date 11/04/21               PT Long Term Goals - 10/29/21 1435       PT LONG TERM GOAL #1   Title Patient will have equal to or > 4+/5 MMT bilateral hip extension to improve ability to perform functional mobility, stair ambulation and ADLs.    Time 4    Status Achieved      PT LONG TERM GOAL #2   Title Patient will report at least 75% overall improvement in subjective complaint to indicate improvement in ability to perform ADLs.    Time 4    Period Weeks    Status On-going      PT LONG TERM GOAL #3   Title Patient will improve FOTO score to predicted value to indicate improvement in functional outcomes    Time 4    Period Weeks    Status On-going                   Plan - 11/03/21 0859     Clinical Impression Statement SI jt checked and is in proper postitioning.  Progressed stability exercises both sidelying  and standing.  Pt educated on the use of the lumbar roll due to complaints of increased pain while sitting .    Examination-Activity Limitations Lift;Stand;Locomotion Level;Transfers;Squat    Examination-Participation Restrictions Cleaning;Laundry;Shop;Occupation;Community Activity;Yard Work    Stability/Clinical Decision Making Stable/Uncomplicated    Rehab Potential Good    PT Frequency 2x / week    PT Home Exercise Plan Eval: glute set, ab set, supine clam; 12/29: bridge, isometric hip ab/adduction, SL clam;1/3:  heel raises; functional squat sit to stand             Patient will benefit from skilled therapeutic intervention in order to improve the following deficits and impairments:  Abnormal gait, Increased fascial restricitons, Impaired sensation, Pain, Improper body mechanics, Decreased mobility, Increased muscle spasms, Decreased activity tolerance, Decreased range of motion, Decreased strength, Impaired flexibility  Visit Diagnosis: Low back pain, unspecified back pain laterality, unspecified chronicity, unspecified whether sciatica present     Problem List Patient Active Problem List   Diagnosis Date Noted   Laceration of head and neck 10/11/2020   Irregular periods 12/14/2016   RLQ abdominal pain 11/30/2016   Missed periods 11/30/2016   Right ovarian cyst 11/30/2016   Urinary frequency 05/27/2015   Pelvic pain in female 05/27/2015   UTI (lower urinary tract infection) 04/28/2015   Rayetta Humphrey, PT CLT [phone]  11/03/2021, 9:24 AM  [facility] [address] [place], [place], [address] Phone: [phone]   Fax:  [phone]  Name: Cathy Wells MRN: [id number] Date of Birth: 03/13/1991

## 2021-11-03 NOTE — Patient Instructions (Signed)
Continue therapy Out of work two more weeks

## 2021-11-03 NOTE — Progress Notes (Signed)
I still hurt.  She has been to PT twice for the lower back.  She was limited in going during the holidays.  She is slightly better but still has lower back pain.   She is doing the exercises at home.  She has no new trauma, no weakness.  Spine/Pelvis examination:  Inspection:  Overall, sacoiliac joint benign and hips nontender; without crepitus or defects.   Thoracic spine inspection: Alignment normal without kyphosis present   Lumbar spine inspection:  Alignment  with normal lumbar lordosis, without scoliosis apparent.   Thoracic spine palpation:  without tenderness of spinal processes   Lumbar spine palpation: without tenderness of lumbar area; without tightness of lumbar muscles    Range of Motion:   Lumbar flexion, forward flexion is normal without pain or tenderness    Lumbar extension is full without pain or tenderness   Left lateral bend is normal without pain or tenderness   Right lateral bend is normal without pain or tenderness   Straight leg raising is normal  Strength & tone: normal   Stability overall normal stability  Encounter Diagnosis  Name Primary?   Acute midline low back pain with left-sided sciatica Yes   I will refill her Tramadol.  Continue PT and the exercises.  Stay out of work another two weeks.  PT has recommended a lumbar support for her chair and I told her to get it.  Return in one month.  I have reviewed the [place] Controlled Substance Reporting System web site prior to prescribing narcotic medicine for this patient.  Call if any problem.  Precautions discussed.   Electronically Signed Darreld Mclean, MD 1/3/202310:33 AM

## 2021-11-05 ENCOUNTER — Ambulatory Visit (HOSPITAL_COMMUNITY): Payer: BC Managed Care – PPO

## 2021-11-05 ENCOUNTER — Other Ambulatory Visit: Payer: Self-pay

## 2021-11-05 DIAGNOSIS — M545 Low back pain, unspecified: Secondary | ICD-10-CM

## 2021-11-05 NOTE — Therapy (Signed)
[place], [place], [address] Phone: [phone]   Fax:  [phone]  Physical Therapy Treatment  Patient Details  Name: Cathy Wells MRN: [id number] Date of Birth: 1991/02/02 Referring Provider (PT): Dr Luna Glasgow   Encounter Date: 11/05/2021   PT End of Session - 11/05/21 1520     Visit Number 4    Number of Visits 8    Date for PT Re-Evaluation 11/18/21    Authorization Type BCBS COMM PPO (30 VL)    Authorization - Visit Number 4    Authorization - Number of Visits 30    Progress Note Due on Visit 8    PT Start Time 1518    PT Stop Time 1600    PT Time Calculation (min) 42 min    Activity Tolerance Patient tolerated treatment well    Behavior During Therapy [facility] for tasks assessed/performed             Past Medical History:  Diagnosis Date   Complication of anesthesia    PONV   Frequency of urination    GERD (gastroesophageal reflux disease)    Hematuria    Left ureteral [place]    Mild asthma    Ovarian cyst    Urgency of urination    Wears contact lenses     Past Surgical History:  Procedure Laterality Date   APPENDECTOMY  07/25/2006   OPEN   URETEROSCOPY WITH HOLMIUM LASER LITHOTRIPSY Left 07/21/2017   Procedure: retrogrades, URETEROSCOPY WITH [place] basketry, stent placement;  Surgeon: Ardis Hughs, MD;  Location: [facility];  Service: Urology;  Laterality: Left;    There were no vitals filed for this visit.   Subjective Assessment - 11/05/21 1522     Subjective Possibly slept wrong last night with some discomfort/flare-up    Limitations Lifting;Standing;Walking    Diagnostic tests xray, MRI    Patient Stated Goals Decreased pain and sciatica    Currently in Pain? Yes    Pain Score 5     Pain Location Buttocks    Pain Orientation Right                               [facility] Adult PT Treatment/Exercise - 11/05/21 0001       Lumbar  Exercises: Supine   Pelvic Tilt 20 reps    Pelvic Tilt Limitations with horizontal towel roll      Lumbar Exercises: Prone   Other Prone Lumbar Exercises prone heel squeeze 3x10      Manual Therapy   Manual Therapy Muscle Energy Technique;Joint mobilization    Manual therapy comments completed seperate from all other aspects of treatment    Joint Mobilization posterior to anterior PIVM at sacrum grade 4 oscilations    Muscle Energy Technique Anterior rotation of Lt SI                       PT Short Term Goals - 10/29/21 1434       PT SHORT TERM GOAL #1   Title Patient will be independent with initial HEP and self-management strategies to improve functional outcomes    Time 2    Period Weeks    Status On-going    Target Date 11/04/21               PT Long Term Goals - 10/29/21 1435  PT LONG TERM GOAL #1   Title Patient will have equal to or > 4+/5 MMT bilateral hip extension to improve ability to perform functional mobility, stair ambulation and ADLs.    Time 4    Status Achieved      PT LONG TERM GOAL #2   Title Patient will report at least 75% overall improvement in subjective complaint to indicate improvement in ability to perform ADLs.    Time 4    Period Weeks    Status On-going      PT LONG TERM GOAL #3   Title Patient will improve FOTO score to predicted value to indicate improvement in functional outcomes    Time 4    Period Weeks    Status On-going                   Plan - 11/05/21 1652     Clinical Impression Statement Minimal response with continued point pain at PSIS/sacrotuberous ligament area.  Pt notes increased pain response with active contraction of glutes overlying PSIS area    Examination-Activity Limitations Lift;Stand;Locomotion Level;Transfers;Squat    Examination-Participation Restrictions Cleaning;Laundry;Shop;Occupation;Community Activity;Yard Work    Stability/Clinical Decision Making Stable/Uncomplicated     Rehab Potential Good    PT Frequency 2x / week    PT Home Exercise Plan Eval: glute set, ab set, supine clam; 12/29: bridge, isometric hip ab/adduction, SL clam;1/3:  heel raises; functional squat sit to stand             Patient will benefit from skilled therapeutic intervention in order to improve the following deficits and impairments:  Abnormal gait, Increased fascial restricitons, Impaired sensation, Pain, Improper body mechanics, Decreased mobility, Increased muscle spasms, Decreased activity tolerance, Decreased range of motion, Decreased strength, Impaired flexibility  Visit Diagnosis: Low back pain, unspecified back pain laterality, unspecified chronicity, unspecified whether sciatica present     Problem List Patient Active Problem List   Diagnosis Date Noted   Laceration of head and neck 10/11/2020   Irregular periods 12/14/2016   RLQ abdominal pain 11/30/2016   Missed periods 11/30/2016   Right ovarian cyst 11/30/2016   Urinary frequency 05/27/2015   Pelvic pain in female 05/27/2015   UTI (lower urinary tract infection) 04/28/2015    [name], PT 11/05/2021, 4:54 PM  [facility] [address] [place], [place], [address] Phone: [phone]   Fax:  [phone]  Name: Cathy Wells MRN: [id number] Date of Birth: [birth year]

## 2021-11-10 ENCOUNTER — Telehealth (HOSPITAL_COMMUNITY): Payer: Self-pay | Admitting: Physical Therapy

## 2021-11-10 ENCOUNTER — Ambulatory Visit (HOSPITAL_COMMUNITY): Payer: BC Managed Care – PPO | Admitting: Physical Therapy

## 2021-11-10 NOTE — Telephone Encounter (Signed)
Pt l/m that her back is hurting to bad to come today -  please cx this apptment

## 2021-11-10 NOTE — Telephone Encounter (Signed)
Originally thought pt was NS, however therapist was able to reach by phone following appt time.  Pt reported she had called clinic and left a message to cancel her appt due to having increased pain today.  States she had taken a pain pill and was unable to drive to her session today.  Teena Irani, PTA/CLT, [facility] [phone]

## 2021-11-11 ENCOUNTER — Telehealth (HOSPITAL_COMMUNITY): Payer: Self-pay | Admitting: Physical Therapy

## 2021-11-11 ENCOUNTER — Encounter (HOSPITAL_COMMUNITY): Payer: BC Managed Care – PPO | Admitting: Physical Therapy

## 2021-11-11 NOTE — Telephone Encounter (Signed)
Patient no show, left message for patient making her aware of missed appointment and reminded her of next appointment.   10:22 AM, 11/11/21 Wyman Songster PT, DPT Physical Therapist at [facility]

## 2021-11-13 ENCOUNTER — Encounter (HOSPITAL_COMMUNITY): Payer: Self-pay | Admitting: Physical Therapy

## 2021-11-13 ENCOUNTER — Ambulatory Visit (HOSPITAL_COMMUNITY): Payer: BC Managed Care – PPO | Admitting: Physical Therapy

## 2021-11-13 ENCOUNTER — Other Ambulatory Visit: Payer: Self-pay

## 2021-11-13 DIAGNOSIS — M545 Low back pain, unspecified: Secondary | ICD-10-CM

## 2021-11-13 NOTE — Therapy (Signed)
[place], [place], [address] Phone: [phone]   Fax:  [phone]  Physical Therapy Treatment  Patient Details  Name: Cathy Wells MRN: [id number] Date of Birth: [birth year] Referring Provider (PT): Dr Luna Glasgow   Encounter Date: 11/13/2021   PT End of Session - 11/13/21 1205     Visit Number 5    Number of Visits 8    Date for PT Re-Evaluation 11/18/21    Authorization Type BCBS COMM PPO (30 VL)    Authorization - Visit Number 5    Authorization - Number of Visits 30    Progress Note Due on Visit 8    PT Start Time 1126    PT Stop Time 1200    PT Time Calculation (min) 34 min    Activity Tolerance Patient tolerated treatment well    Behavior During Therapy [facility] for tasks assessed/performed             Past Medical History:  Diagnosis Date   Complication of anesthesia    PONV   Frequency of urination    GERD (gastroesophageal reflux disease)    Hematuria    Left ureteral [place]    Mild asthma    Ovarian cyst    Urgency of urination    Wears contact lenses     Past Surgical History:  Procedure Laterality Date   APPENDECTOMY  07/25/2006   OPEN   URETEROSCOPY WITH HOLMIUM LASER LITHOTRIPSY Left 07/21/2017   Procedure: retrogrades, URETEROSCOPY WITH [place] basketry, stent placement;  Surgeon: Ardis Hughs, MD;  Location: [facility];  Service: Urology;  Laterality: Left;    There were no vitals filed for this visit.   Subjective Assessment - 11/13/21 1206     Subjective Patient had another flare up since the last session due to her home project of painting her cabinets. She states that she is not finished painting and will have more work to do over the weekend.    Limitations Lifting;Standing;Walking    Patient Stated Goals Decreased pain and sciatica    Currently in Pain? Yes    Pain Score 4     Pain Location Buttocks    Pain Orientation Right    Pain Descriptors /  Indicators Aching    Pain Type Acute pain                               [facility] Adult PT Treatment/Exercise - 11/13/21 0001       Lumbar Exercises: Supine   Pelvic Tilt 20 reps    Other Supine Lumbar Exercises LTRs 1x15, belted hip abd iso in (hooklying, dead bug, and dead bug to hooklying flow) 5x5s each, Sidelying QL stretch with foam wedge 4x20s each, childs to cobra pose flow 2x40s                       PT Short Term Goals - 10/29/21 1434       PT SHORT TERM GOAL #1   Title Patient will be independent with initial HEP and self-management strategies to improve functional outcomes    Time 2    Period Weeks    Status On-going    Target Date 11/04/21               PT Long Term Goals - 10/29/21 1435       PT LONG TERM  GOAL #1   Title Patient will have equal to or > 4+/5 MMT bilateral hip extension to improve ability to perform functional mobility, stair ambulation and ADLs.    Time 4    Status Achieved      PT LONG TERM GOAL #2   Title Patient will report at least 75% overall improvement in subjective complaint to indicate improvement in ability to perform ADLs.    Time 4    Period Weeks    Status On-going      PT LONG TERM GOAL #3   Title Patient will improve FOTO score to predicted value to indicate improvement in functional outcomes    Time 4    Period Weeks    Status On-going                   Plan - 11/13/21 1216     Clinical Impression Statement No major changes in presentation with regard to the previous treatment session or with treatments performed during today's session. No irregular movement patterns are noticed due to her pain with the exception of being unable to reach end range during the [employer] exercise.    Examination-Activity Limitations Lift;Stand;Locomotion Level;Transfers;Squat    Examination-Participation Restrictions Cleaning;Laundry;Shop;Occupation;Community Activity;Yard Work     Stability/Clinical Decision Making Stable/Uncomplicated    Rehab Potential Good    PT Frequency 2x / week    PT Next Visit Plan Continue with stabilization exercises and low back alternating motion within a painfree range.    PT Home Exercise Plan Eval: glute set, ab set, supine clam; 12/29: bridge, isometric hip ab/adduction, SL clam;1/3:  heel raises; functional squat sit to stand    Consulted and Agree with Plan of Care Patient             Patient will benefit from skilled therapeutic intervention in order to improve the following deficits and impairments:  Abnormal gait, Increased fascial restricitons, Impaired sensation, Pain, Improper body mechanics, Decreased mobility, Increased muscle spasms, Decreased activity tolerance, Decreased range of motion, Decreased strength, Impaired flexibility  Visit Diagnosis: Low back pain, unspecified back pain laterality, unspecified chronicity, unspecified whether sciatica present     Problem List Patient Active Problem List   Diagnosis Date Noted   Laceration of head and neck 10/11/2020   Irregular periods 12/14/2016   RLQ abdominal pain 11/30/2016   Missed periods 11/30/2016   Right ovarian cyst 11/30/2016   Urinary frequency 05/27/2015   Pelvic pain in female 05/27/2015   UTI (lower urinary tract infection) 04/28/2015    12:54 PM,11/13/21 Adalberto Cole, DPT [facility]   [place] [address] [place], [place], [address] Phone: [phone]   Fax:  [phone]  Name: Cathy Wells MRN: [id number] Date of Birth: 08-29-1991

## 2021-11-17 ENCOUNTER — Encounter (HOSPITAL_COMMUNITY): Payer: BC Managed Care – PPO | Admitting: Physical Therapy

## 2021-11-18 ENCOUNTER — Ambulatory Visit (HOSPITAL_COMMUNITY): Payer: BC Managed Care – PPO | Admitting: Physical Therapy

## 2021-11-18 ENCOUNTER — Other Ambulatory Visit: Payer: Self-pay

## 2021-11-18 DIAGNOSIS — F419 Anxiety disorder, unspecified: Secondary | ICD-10-CM | POA: Diagnosis not present

## 2021-11-18 DIAGNOSIS — M545 Low back pain, unspecified: Secondary | ICD-10-CM | POA: Diagnosis not present

## 2021-11-18 NOTE — Therapy (Addendum)
[facility] [address] [place], [place], [address] Phone: [phone]   Fax:  [phone]  Physical Therapy Treatment Progress Note Reporting Period 10/21/2021 to 11/18/2021  See note below for Objective Data and Assessment of Progress/Goals.     Patient Details  Name: Cathy Wells MRN: [id number] Date of Birth: 1991/04/11 Referring Provider (PT): Dr Luna Glasgow   Encounter Date: 11/18/2021   PT End of Session - 11/18/21 1617     Visit Number 6    Number of Visits 14    Date for PT Re-Evaluation 12/23/21    Authorization Type BCBS COMM PPO (30 VL)    Authorization - Visit Number 6    Authorization - Number of Visits 30    Progress Note Due on Visit 14    PT Start Time 1536    PT Stop Time 1615    PT Time Calculation (min) 39 min    Activity Tolerance Patient tolerated treatment well    Behavior During Therapy WFL for tasks assessed/performed             Past Medical History:  Diagnosis Date   Complication of anesthesia    PONV   Frequency of urination    GERD (gastroesophageal reflux disease)    Hematuria    Left ureteral [place]    Mild asthma    Ovarian cyst    Urgency of urination    Wears contact lenses     Past Surgical History:  Procedure Laterality Date   APPENDECTOMY  07/25/2006   OPEN   URETEROSCOPY WITH HOLMIUM LASER LITHOTRIPSY Left 07/21/2017   Procedure: retrogrades, URETEROSCOPY WITH [place] basketry, stent placement;  Surgeon: Ardis Hughs, MD;  Location: [facility];  Service: Urology;  Laterality: Left;    There were no vitals filed for this visit.   Subjective Assessment - 11/18/21 1556     Subjective Pt states her pain vaires according to positional changes and activities.  STanding up from a chair increases pain.  Currently without pain when entered clinic.    Currently in Pain? No/denies                [facility] PT Assessment - 11/18/21 1557       Assessment    Medical Diagnosis LBP    Referring Provider (PT) Dr Luna Glasgow    Onset Date/Surgical Date 09/27/21    Next MD Visit 12/01/21    Prior Therapy Yes      Precautions   Precautions None      Restrictions   Weight Bearing Restrictions No      Home Environment   Living Environment Private residence      Prior Function   Level of Independence Independent      Cognition   Overall Cognitive Status Within Functional Limits for tasks assessed      Observation/Other Assessments   Focus on Therapeutic Outcomes (FOTO)  50% FS (was 47% functional status)      AROM   Lumbar Flexion WNL (was 50% limited    Lumbar Extension 10% limited (was 25% limited)    Lumbar - Right Side Bend WNL    Lumbar - Left Side Bend WNL but increased pain with Lt sidebending      Strength   Right Hip Flexion 5/5   was 5/5   Right Hip Extension 5/5   was 4/5 with pain   Right Hip ABduction 5/5    Left Hip Flexion 5/5  Left Hip Extension 4/5   pain   Left Hip ABduction 5/5    Right Knee Extension 5/5    Left Knee Extension 5/5      Palpation   Palpation comment increased TTP about sacrum      Special Tests   Other special tests --                           [facility] Adult PT Treatment/Exercise - 11/18/21 0001       Lumbar Exercises: Seated   Sit to Stand 5 reps    Sit to Stand Limitations with Lt further back to increase weight shift to Lt      Lumbar Exercises: Supine   Bridge 5 reps    Bridge Limitations Lt LE only with Rt on support surface                       PT Short Term Goals - 11/18/21 1619       PT SHORT TERM GOAL #1   Title Patient will be independent with initial HEP and self-management strategies to improve functional outcomes    Time 2    Period Weeks    Status On-going    Target Date 11/04/21               PT Long Term Goals - 11/18/21 1618       PT LONG TERM GOAL #1   Title Patient will have equal to or > 4+/5 MMT bilateral hip  extension to improve ability to perform functional mobility, stair ambulation and ADLs.    Time 4    Status On-going      PT LONG TERM GOAL #2   Title Patient will report at least 75% overall improvement in subjective complaint to indicate improvement in ability to perform ADLs.    Time 4    Period Weeks    Status On-going      PT LONG TERM GOAL #3   Title Patient will improve FOTO score to predicted value to indicate improvement in functional outcomes    Time 4    Period Weeks    Status On-going                   Plan - 11/18/21 1633     Clinical Impression Statement Progress note completed today and recertification.  Pt has completed 6 PT visits over the past 6 weeks for Lt SIJ pain and dysfunction.  Pt reports most pain with transitioning movement and following periods of maintaining same positioning.  Pt states she feels she has improved in her strength and frequency of pain, however it still persists. Strength testing reveals all LE mm now 5/5 with exception of Lt hip strength.  Lumbar ROM has also improved to WNL for flexion and just slightly reduced for extension with end range of motion discomfort.   SIJ with Lt anterior rotation with MET completed to adjust with good results.  Pt reported no pain with standing or walking around gym following.  Instructed with sit to stand with Lt closer to body and Lt single leg bridges to help improve Lt gluteal strength and reduce dysfunction.  Manual initiated this session to sacral notch and gluteal mm with noted tightness and discomfort in this area.  Overall improvement at end of session with ease of transitioning and no reports of pain.  Pt will continue to benefit from 4  more weeks of therapy to meet all remaining Long term goals.    Examination-Activity Limitations Lift;Stand;Locomotion Level;Transfers;Squat    Examination-Participation Restrictions Cleaning;Laundry;Shop;Occupation;Community Activity;Yard Work    Stability/Clinical  Decision Making Stable/Uncomplicated    Rehab Potential Good    PT Frequency 2x / week    PT Next Visit Plan Check SIJ and complete muscle energy as needed, manual as needed.  F/U on HEP; progress Lt gluteal strength and pain reduction.    PT Home Exercise Plan Eval: glute set, ab set, supine clam; 12/29: bridge, isometric hip ab/adduction, SL clam;1/3:  heel raises; functional squat sit to stand  1/18:  STS with Lt closer to body to increase weight shift/use of glute, Lt LE bridge    Consulted and Agree with Plan of Care Patient             Patient will benefit from skilled therapeutic intervention in order to improve the following deficits and impairments:  Abnormal gait, Increased fascial restricitons, Impaired sensation, Pain, Improper body mechanics, Decreased mobility, Increased muscle spasms, Decreased activity tolerance, Decreased range of motion, Decreased strength, Impaired flexibility  Visit Diagnosis: Low back pain, unspecified back pain laterality, unspecified chronicity, unspecified whether sciatica present     Problem List Patient Active Problem List   Diagnosis Date Noted   Laceration of head and neck 10/11/2020   Irregular periods 12/14/2016   RLQ abdominal pain 11/30/2016   Missed periods 11/30/2016   Right ovarian cyst 11/30/2016   Urinary frequency 05/27/2015   Pelvic pain in female 05/27/2015   UTI (lower urinary tract infection) 04/28/2015   Teena Irani, PTA/CLT, WTA [phone]  Teena Irani, PTA 11/18/2021, 4:38 PM  9:15 AM, 11/19/21 Josue Hector PT DPT  Physical Therapist with [facility]  [phone] [place] [address] [place], [place], [address] Phone: [phone]   Fax:  [phone]  Name: Cathy Wells MRN: [id number] Date of Birth: April 15, 1991

## 2021-11-19 ENCOUNTER — Encounter (HOSPITAL_COMMUNITY): Payer: BC Managed Care – PPO | Admitting: Physical Therapy

## 2021-11-19 NOTE — Addendum Note (Signed)
Addended by: Josue Hector A on: 11/19/2021 09:17 AM   Modules accepted: Orders

## 2021-11-23 ENCOUNTER — Encounter (HOSPITAL_COMMUNITY): Payer: Self-pay

## 2021-11-23 ENCOUNTER — Other Ambulatory Visit: Payer: Self-pay

## 2021-11-23 ENCOUNTER — Ambulatory Visit (HOSPITAL_COMMUNITY): Payer: BC Managed Care – PPO

## 2021-11-23 DIAGNOSIS — M545 Low back pain, unspecified: Secondary | ICD-10-CM

## 2021-11-23 NOTE — Patient Instructions (Signed)
Access Code: [email or web address] URL: [email or web address] Date: 11/23/2021 Prepared by: Aasia Peavler Hartnett-Rands  Exercises Prone Hip Extension with Bent Knee - 1 x daily - 7 x weekly - 1-3 sets - 5-10 reps - 3 hold Prone Chest Lift with Arms Overhead - 1 x daily - 7 x weekly - 1-3 sets - 5-10 reps - 3 hold Prone Alternating Arm and Leg Lifts - 1 x daily - 7 x weekly - 1-3 sets - 5-10 reps - 3 hold Quadruped Alternating Arm Lift - 1 x daily - 7 x weekly - 1-3 sets - 5-10 reps - 3 hold

## 2021-11-23 NOTE — Therapy (Signed)
[facility] [place], [place], [address] Phone: [phone]   Fax:  [phone]  Physical Therapy Treatment  Patient Details  Name: Cathy Wells MRN: [id number] Date of Birth: May 26, 1991 Referring Provider (PT): Dr Luna Glasgow   Encounter Date: 11/23/2021   PT End of Session - 11/23/21 1020     Visit Number 7    Number of Visits 14    Date for PT Re-Evaluation 12/23/21    Authorization Type BCBS COMM PPO (30 VL)    Authorization - Visit Number 7    Authorization - Number of Visits 30    Progress Note Due on Visit 14    PT Start Time 1022    PT Stop Time 1102    PT Time Calculation (min) 40 min    Activity Tolerance Patient tolerated treatment well    Behavior During Therapy [facility] for tasks assessed/performed             Past Medical History:  Diagnosis Date   Complication of anesthesia    PONV   Frequency of urination    GERD (gastroesophageal reflux disease)    Hematuria    Left ureteral [place]    Mild asthma    Ovarian cyst    Urgency of urination    Wears contact lenses     Past Surgical History:  Procedure Laterality Date   APPENDECTOMY  07/25/2006   OPEN   URETEROSCOPY WITH HOLMIUM LASER LITHOTRIPSY Left 07/21/2017   Procedure: retrogrades, URETEROSCOPY WITH [place] basketry, stent placement;  Surgeon: Ardis Hughs, MD;  Location: [facility];  Service: Urology;  Laterality: Left;    There were no vitals filed for this visit.   Subjective Assessment - 11/23/21 1036     Subjective No pain now but had an instance of pain upon standing from the car a little while ago.    Currently in Pain? No/denies                [facility] PT Assessment - 11/23/21 0001       Posture/Postural Control   Posture Comments leg length equal at 31 inches each ASIS to medial malleolus                [facility] Adult PT Treatment/Exercise - 11/23/21 0001       Lumbar Exercises: Seated   Sit  to Stand 5 reps;Other (comment)    Sit to Stand Limitations 3 sets with Lt further back to increase weight shift to Lt      Lumbar Exercises: Supine   Bridge 10 reps    Bridge Limitations 2 sets; Lt LE only with Rt on support surface      Lumbar Exercises: Prone   Single Arm Raise Right;Left;10 reps    Opposite Arm/Leg Raise Right arm/Left leg;Left arm/Right leg;10 reps;3 seconds    Other Prone Lumbar Exercises L hip extension with knee bent x10      Lumbar Exercises: Quadruped   Single Arm Raise Right;Left;10 reps;2 seconds    Straight Leg Raise 5 reps;2 seconds    Plank on knees/elbows 10 sec hold x5      Manual Therapy   Manual Therapy Muscle Energy Technique    Manual therapy comments completed seperate from all other aspects of treatment    Muscle Energy Technique Anterior rotation of Lt SI                  PT Education -  11/23/21 1220     Education Details Advanced HEP.    Person(s) Educated Patient    Methods Explanation;Handout    Comprehension Verbalized understanding              PT Short Term Goals - 11/23/21 1223       PT SHORT TERM GOAL #1   Title Patient will be independent with initial HEP and self-management strategies to improve functional outcomes    Time 2    Period Weeks    Status Achieved    Target Date 11/04/21               PT Long Term Goals - 11/23/21 1223       PT LONG TERM GOAL #1   Title Patient will have equal to or > 4+/5 MMT bilateral hip extension to improve ability to perform functional mobility, stair ambulation and ADLs.    Time 4    Status On-going      PT LONG TERM GOAL #2   Title Patient will report at least 75% overall improvement in subjective complaint to indicate improvement in ability to perform ADLs.    Time 4    Period Weeks    Status On-going      PT LONG TERM GOAL #3   Title Patient will improve FOTO score to predicted value to indicate improvement in functional outcomes    Time 4    Period  Weeks    Status On-going                   Plan - 11/23/21 1020     Clinical Impression Statement Session focused on posterior and core strengthening with emphasis on sacroiliac stability. Assessed SI with left anterior rotation correction. Measured leg length with both coming to 31 inches ASIS to medial malleolus.    Examination-Activity Limitations Lift;Stand;Locomotion Level;Transfers;Squat    Examination-Participation Restrictions Cleaning;Laundry;Shop;Occupation;Community Activity;Yard Work    Stability/Clinical Decision Making Stable/Uncomplicated    Rehab Potential Good    PT Frequency 2x / week    PT Next Visit Plan Check SIJ and complete muscle energy as needed, manual as needed.  F/U on HEP; progress Lt gluteal strength and pain reduction.    PT Home Exercise Plan Eval: glute set, ab set, supine clam; 12/29: bridge, isometric hip ab/adduction, SL clam;1/3:  heel raises; functional squat sit to stand  1/18:  STS with Lt closer to body to increase weight shift/use of glute, Lt LE bridge; prone hip extension w/ knee bent, prone and quaraped arm raise, prone and quadraped leg raise, prone alt arm/leg raise.    Consulted and Agree with Plan of Care Patient             Patient will benefit from skilled therapeutic intervention in order to improve the following deficits and impairments:  Abnormal gait, Increased fascial restricitons, Impaired sensation, Pain, Improper body mechanics, Decreased mobility, Increased muscle spasms, Decreased activity tolerance, Decreased range of motion, Decreased strength, Impaired flexibility  Visit Diagnosis: Low back pain, unspecified back pain laterality, unspecified chronicity, unspecified whether sciatica present     Problem List Patient Active Problem List   Diagnosis Date Noted   Laceration of head and neck 10/11/2020   Irregular periods 12/14/2016   RLQ abdominal pain 11/30/2016   Missed periods 11/30/2016   Right ovarian cyst  11/30/2016   Urinary frequency 05/27/2015   Pelvic pain in female 05/27/2015   UTI (lower urinary tract infection) 04/28/2015   Floria Raveling.  Hartnett-Rands, MS, PT Per [place] [phone]  Jeannie Done, PT 11/23/2021, 12:24 PM  [facility] [address] [place], [place], [address] Phone: [phone]   Fax:  [phone]  Name: Cathy Wells MRN: [id number] Date of Birth: October 08, 1991

## 2021-11-26 ENCOUNTER — Other Ambulatory Visit: Payer: Self-pay

## 2021-11-26 ENCOUNTER — Ambulatory Visit (INDEPENDENT_AMBULATORY_CARE_PROVIDER_SITE_OTHER): Payer: BC Managed Care – PPO | Admitting: Orthopaedic Surgery

## 2021-11-26 ENCOUNTER — Encounter: Payer: Self-pay | Admitting: Orthopaedic Surgery

## 2021-11-26 ENCOUNTER — Ambulatory Visit (HOSPITAL_COMMUNITY): Payer: BC Managed Care – PPO | Admitting: Physical Therapy

## 2021-11-26 ENCOUNTER — Ambulatory Visit: Payer: BC Managed Care – PPO

## 2021-11-26 VITALS — BP 149/98 | HR 98 | Ht 62.0 in | Wt 239.0 lb

## 2021-11-26 DIAGNOSIS — M545 Low back pain, unspecified: Secondary | ICD-10-CM | POA: Diagnosis not present

## 2021-11-26 DIAGNOSIS — M5442 Lumbago with sciatica, left side: Secondary | ICD-10-CM

## 2021-11-26 NOTE — Progress Notes (Signed)
I fell.  I hurt.  She has been going to PT for her lower back pain.  She fell the other day and has increased pain of the left SI joint and sacrum area.  She has no numbness.  She has no weakness.  She is tender over the left sacrum area.  There is no redness.  NV intact.  Spine/Pelvis examination:  Inspection:  Overall, sacoiliac joint benign and hips nontender; without crepitus or defects.   Thoracic spine inspection: Alignment normal without kyphosis present   Lumbar spine inspection:  Alignment  with normal lumbar lordosis, without scoliosis apparent.   Thoracic spine palpation:  without tenderness of spinal processes   Lumbar spine palpation: without tenderness of lumbar area; without tightness of lumbar muscles    Range of Motion:   Lumbar flexion, forward flexion is normal without pain or tenderness    Lumbar extension is full without pain or tenderness   Left lateral bend is normal without pain or tenderness   Right lateral bend is normal without pain or tenderness   Straight leg raising is normal  Strength & tone: normal   Stability overall normal stability  I have reviewed the PT notes.  X-rays were done of the sacrum, reported separately.  Encounter Diagnosis  Name Primary?   Acute midline low back pain with left-sided sciatica Yes   No fracture is noted today.  Continue pain medicine and PT.  Stay out of work.  Return in two weeks.  Call if any problem.  Precautions discussed.  Electronically Signed Darreld Mclean, MD 1/26/202310:19 AM

## 2021-11-26 NOTE — Therapy (Signed)
[facility] [place], [place], [address] Phone: [phone]   Fax:  [phone]  Physical Therapy Treatment  Patient Details  Name: Cathy Wells MRN: [id number] Date of Birth: 11-Apr-1991 Referring Provider (PT): Dr Luna Glasgow   Encounter Date: 11/26/2021   PT End of Session - 11/26/21 1755     Visit Number 8    Number of Visits 14    Date for PT Re-Evaluation 12/23/21    Authorization Type BCBS COMM PPO (30 VL)    Authorization - Visit Number 8    Authorization - Number of Visits 30    Progress Note Due on Visit 14    PT Start Time 1623    PT Stop Time 1715    PT Time Calculation (min) 52 min    Activity Tolerance Patient tolerated treatment well    Behavior During Therapy [facility] for tasks assessed/performed             Past Medical History:  Diagnosis Date   Complication of anesthesia    PONV   Frequency of urination    GERD (gastroesophageal reflux disease)    Hematuria    Left ureteral [place]    Mild asthma    Ovarian cyst    Urgency of urination    Wears contact lenses     Past Surgical History:  Procedure Laterality Date   APPENDECTOMY  07/25/2006   OPEN   URETEROSCOPY WITH HOLMIUM LASER LITHOTRIPSY Left 07/21/2017   Procedure: retrogrades, URETEROSCOPY WITH [place] basketry, stent placement;  Surgeon: Ardis Hughs, MD;  Location: [facility];  Service: Urology;  Laterality: Left;    There were no vitals filed for this visit.   Subjective Assessment - 11/26/21 1744     Subjective Pt states she fell on her porch because her shoes were slick and landed on the same place again.  Was doing good until this now the pain has returned.  states she went to Dr Luna Glasgow today and he ordered xrays which were negative.    Currently in Pain? Yes    Pain Score 5     Pain Location Buttocks    Pain Orientation Left    Pain Descriptors / Indicators Aching;Sore;Tender                                [facility] Adult PT Treatment/Exercise - 11/26/21 0001       Lumbar Exercises: Supine   Bridge 15 reps    Bridge Limitations 2 sets; Lt LE only with Rt on ball    Large Ball Abdominal Isometric 15 reps    Large Ball Abdominal Isometric Limitations green ball    Large Ball Oblique Isometric 15 reps    Large Ball Oblique Isometric Limitations green ball      Lumbar Exercises: Prone   Other Prone Lumbar Exercises prone on elbows 2 minutes      Lumbar Exercises: Quadruped   Single Arm Raise Right;Left;10 reps;2 seconds    Straight Leg Raise 10 reps    Straight Leg Raises Limitations 2 sets on Rt LE me    Plank 3X20" modified on knees      Manual Therapy   Manual Therapy Muscle Energy Technique;Soft tissue mobilization    Manual therapy comments completed seperate from all other aspects of treatment    Soft tissue mobilization to lt SIJ, LB and gluteal mm to reduce  tightness and pain.    Muscle Energy Technique Anterior rotation of Lt SI                       PT Short Term Goals - 11/23/21 1223       PT SHORT TERM GOAL #1   Title Patient will be independent with initial HEP and self-management strategies to improve functional outcomes    Time 2    Period Weeks    Status Achieved    Target Date 11/04/21               PT Long Term Goals - 11/23/21 1223       PT LONG TERM GOAL #1   Title Patient will have equal to or > 4+/5 MMT bilateral hip extension to improve ability to perform functional mobility, stair ambulation and ADLs.    Time 4    Status On-going      PT LONG TERM GOAL #2   Title Patient will report at least 75% overall improvement in subjective complaint to indicate improvement in ability to perform ADLs.    Time 4    Period Weeks    Status On-going      PT LONG TERM GOAL #3   Title Patient will improve FOTO score to predicted value to indicate improvement in functional outcomes    Time 4    Period Weeks     Status On-going                   Plan - 11/26/21 1756     Clinical Impression Statement Lt SI is only slightly rotated this session.  MET completed to correct this with resultant reduction of pain to less than 1/10.  Increased plank holds to 20 seconds today with noted fatigue last 5 seconds of each repetition.  Began core strengthening using physioball for both rectus abdominus and obliques.  Manual completed in prone with noted tightness to Lt gluteal mm.  Pt will continue to benefit from skilled physical therapy.    Examination-Activity Limitations Lift;Stand;Locomotion Level;Transfers;Squat    Examination-Participation Restrictions Cleaning;Laundry;Shop;Occupation;Community Activity;Yard Work    Stability/Clinical Decision Making Stable/Uncomplicated    Rehab Potential Good    PT Frequency 2x / week    PT Next Visit Plan Check SIJ and complete muscle energy as needed, manual as needed.  F/U on HEP; progress Lt gluteal strength and pain reduction.    PT Home Exercise Plan Eval: glute set, ab set, supine clam; 12/29: bridge, isometric hip ab/adduction, SL clam;1/3:  heel raises; functional squat sit to stand  1/18:  STS with Lt closer to body to increase weight shift/use of glute, Lt LE bridge; prone hip extension w/ knee bent, prone and quaraped arm raise, prone and quadraped leg raise, prone alt arm/leg raise.    Consulted and Agree with Plan of Care Patient             Patient will benefit from skilled therapeutic intervention in order to improve the following deficits and impairments:  Abnormal gait, Increased fascial restricitons, Impaired sensation, Pain, Improper body mechanics, Decreased mobility, Increased muscle spasms, Decreased activity tolerance, Decreased range of motion, Decreased strength, Impaired flexibility  Visit Diagnosis: Low back pain, unspecified back pain laterality, unspecified chronicity, unspecified whether sciatica present     Problem  List Patient Active Problem List   Diagnosis Date Noted   Laceration of head and neck 10/11/2020   Irregular periods 12/14/2016   RLQ  abdominal pain 11/30/2016   Missed periods 11/30/2016   Right ovarian cyst 11/30/2016   Urinary frequency 05/27/2015   Pelvic pain in female 05/27/2015   UTI (lower urinary tract infection) 04/28/2015   Teena Irani, PTA/CLT, WTA [phone]  Teena Irani, PTA 11/26/2021, 5:56 PM  [facility] [address] [place], [place], [address] Phone: [phone]   Fax:  [phone]  Name: Cathy Wells MRN: [id number] Date of Birth: [birth year]

## 2021-11-26 NOTE — Patient Instructions (Signed)
OUT OF WORK ?

## 2021-12-01 ENCOUNTER — Ambulatory Visit (HOSPITAL_COMMUNITY): Payer: BC Managed Care – PPO | Admitting: Physical Therapy

## 2021-12-01 ENCOUNTER — Other Ambulatory Visit: Payer: Self-pay

## 2021-12-01 ENCOUNTER — Ambulatory Visit: Payer: BC Managed Care – PPO | Admitting: Orthopaedic Surgery

## 2021-12-01 DIAGNOSIS — M545 Low back pain, unspecified: Secondary | ICD-10-CM | POA: Diagnosis not present

## 2021-12-01 NOTE — Patient Instructions (Signed)
HIP / KNEE: Extension - Prone    Squeeze glutes. Raise leg up. Keep knee straight. _10__ reps per set, _2__ sets per day, _7__ days per week    Piriformis Stretch, Sitting    Sit, one ankle on opposite knee, same-side hand on crossed knee. Push down on knee, keeping spine straight. Lean torso forward, with flat back, until tension is felt in hamstrings and gluteals of crossed-leg side. Hold _30__ seconds. Repeat __4_ times per session. Do _2_ sessions per day.

## 2021-12-01 NOTE — Therapy (Signed)
[facility] [address] [place], [place], [address] Phone: [phone]   Fax:  [phone]  Physical Therapy Treatment  Patient Details  Name: Cathy Wells MRN: [id number] Date of Birth: [birth year] Referring Provider (PT): Dr Hilda Lias   Encounter Date: 12/01/2021   PT End of Session - 12/01/21 1220     Visit Number 9    Number of Visits 14    Date for PT Re-Evaluation 12/23/21    Authorization Type BCBS COMM PPO (30 VL)    Authorization - Visit Number 9    Authorization - Number of Visits 30    Progress Note Due on Visit 14    PT Start Time 1000    PT Stop Time 1045    PT Time Calculation (min) 45 min    Activity Tolerance Patient tolerated treatment well    Behavior During Therapy [facility] for tasks assessed/performed             Past Medical History:  Diagnosis Date   Complication of anesthesia    PONV   Frequency of urination    GERD (gastroesophageal reflux disease)    Hematuria    Left ureteral [place]    Mild asthma    Ovarian cyst    Urgency of urination    Wears contact lenses     Past Surgical History:  Procedure Laterality Date   APPENDECTOMY  07/25/2006   OPEN   URETEROSCOPY WITH HOLMIUM LASER LITHOTRIPSY Left 07/21/2017   Procedure: retrogrades, URETEROSCOPY WITH [place] basketry, stent placement;  Surgeon: Crist Fat, MD;  Location: [facility];  Service: Urology;  Laterality: Left;    There were no vitals filed for this visit.   Subjective Assessment - 12/01/21 1216     Subjective PT states she didnt have any pain until Sunday then it was so uncomfortable, expecially with ambulation.    Currently in Pain? Yes    Pain Score 6     Pain Location Back    Pain Orientation Left    Pain Descriptors / Indicators Aching;Sore                               [facility] Adult PT Treatment/Exercise - 12/01/21 0001       Lumbar Exercises: Stretches   Piriformis Stretch  Right;Left;30 seconds    Piriformis Stretch Limitations seated      Lumbar Exercises: Supine   Bridge 15 reps    Bridge Limitations 2 sets; Lt LE only with Rt on ball    Large Ball Abdominal Isometric 15 reps    Large Ball Abdominal Isometric Limitations green ball    Large Ball Oblique Isometric 15 reps    Large Ball Oblique Isometric Limitations green ball      Lumbar Exercises: Prone   Straight Leg Raise 10 reps      Manual Therapy   Manual Therapy Muscle Energy Technique;Soft tissue mobilization    Manual therapy comments completed seperate from all other aspects of treatment    Soft tissue mobilization to lt SIJ, LB and gluteal mm to reduce tightness and pain.    Muscle Energy Technique Anterior rotation of Lt SI                       PT Short Term Goals - 11/23/21 1223       PT SHORT TERM GOAL #1  Title Patient will be independent with initial HEP and self-management strategies to improve functional outcomes    Time 2    Period Weeks    Status Achieved    Target Date 11/04/21               PT Long Term Goals - 11/23/21 1223       PT LONG TERM GOAL #1   Title Patient will have equal to or > 4+/5 MMT bilateral hip extension to improve ability to perform functional mobility, stair ambulation and ADLs.    Time 4    Status On-going      PT LONG TERM GOAL #2   Title Patient will report at least 75% overall improvement in subjective complaint to indicate improvement in ability to perform ADLs.    Time 4    Period Weeks    Status On-going      PT LONG TERM GOAL #3   Title Patient will improve FOTO score to predicted value to indicate improvement in functional outcomes    Time 4    Period Weeks    Status On-going                   Plan - 12/01/21 1220     Clinical Impression Statement SI rotated and with increased pain this session.  Began with muscle energy but did nto seem to change symptoms.  Pt with some discomfort completing  single leg bridge so discontinued this sesison.  Added seated piriformis which reduced symptoms and added hip extension in prone wihtout any discomfort, just weakness.  Completed another muscle energy at end of sesion which did seem to reduce symptoms and pt able to ambulate after without pain.  Encoruaged pt to complete self mobilization at home and continue HEP more regularly with addition of piriformis stretch and hip extensions.  Pt verbalized understanding.    Examination-Activity Limitations Lift;Stand;Locomotion Level;Transfers;Squat    Examination-Participation Restrictions Cleaning;Laundry;Shop;Occupation;Community Activity;Yard Work    Stability/Clinical Decision Making Stable/Uncomplicated    Rehab Potential Good    PT Frequency 2x / week    PT Next Visit Plan Check SIJ and complete muscle energy as needed, manual as needed.  F/U on HEP; progress Lt gluteal strength and pain reduction.    PT Home Exercise Plan Eval: glute set, ab set, supine clam; 12/29: bridge, isometric hip ab/adduction, SL clam;1/3:  heel raises; functional squat sit to stand  1/18:  STS with Lt closer to body to increase weight shift/use of glute, Lt LE bridge; prone hip extension w/ knee bent, prone and quaraped arm raise, prone and quadraped leg raise, prone alt arm/leg raise.  1/31:  seated piriformis stretch, prone hip extension    Consulted and Agree with Plan of Care Patient             Patient will benefit from skilled therapeutic intervention in order to improve the following deficits and impairments:  Abnormal gait, Increased fascial restricitons, Impaired sensation, Pain, Improper body mechanics, Decreased mobility, Increased muscle spasms, Decreased activity tolerance, Decreased range of motion, Decreased strength, Impaired flexibility  Visit Diagnosis: Low back pain, unspecified back pain laterality, unspecified chronicity, unspecified whether sciatica present     Problem List Patient Active  Problem List   Diagnosis Date Noted   Laceration of head and neck 10/11/2020   Irregular periods 12/14/2016   RLQ abdominal pain 11/30/2016   Missed periods 11/30/2016   Right ovarian cyst 11/30/2016   Urinary frequency 05/27/2015  Pelvic pain in female 05/27/2015   UTI (lower urinary tract infection) 04/28/2015   Teena Irani, PTA/CLT, WTA [phone]  Teena Irani, PTA 12/01/2021, 12:24 PM  [place] [address] [place], [place], [address] Phone: [phone]   Fax:  [phone]  Name: Cathy Wells MRN: [id number] Date of Birth: 09/14/1991

## 2021-12-03 ENCOUNTER — Encounter (HOSPITAL_COMMUNITY): Payer: Self-pay

## 2021-12-03 ENCOUNTER — Other Ambulatory Visit: Payer: Self-pay

## 2021-12-03 ENCOUNTER — Ambulatory Visit (HOSPITAL_COMMUNITY): Payer: BC Managed Care – PPO | Attending: Orthopaedic Surgery

## 2021-12-03 DIAGNOSIS — M545 Low back pain, unspecified: Secondary | ICD-10-CM

## 2021-12-03 NOTE — Therapy (Signed)
[place], [place], [address] Phone: [phone]   Fax:  [phone]  Physical Therapy Treatment  Patient Details  Name: Cathy Wells MRN: [id number] Date of Birth: Mar 01, 1991 Referring Provider (PT): Dr Luna Glasgow   Encounter Date: 12/03/2021   PT End of Session - 12/03/21 1624     Visit Number 10    Number of Visits 14    Date for PT Re-Evaluation 12/23/21    Authorization Type BCBS COMM PPO (30 VL)    Authorization - Visit Number 10    Authorization - Number of Visits 30    Progress Note Due on Visit 14    PT Start Time 1618    PT Stop Time 1658    PT Time Calculation (min) 40 min    Activity Tolerance Patient tolerated treatment well    Behavior During Therapy [facility] for tasks assessed/performed             Past Medical History:  Diagnosis Date   Complication of anesthesia    PONV   Frequency of urination    GERD (gastroesophageal reflux disease)    Hematuria    Left ureteral [place]    Mild asthma    Ovarian cyst    Urgency of urination    Wears contact lenses     Past Surgical History:  Procedure Laterality Date   APPENDECTOMY  07/25/2006   OPEN   URETEROSCOPY WITH HOLMIUM LASER LITHOTRIPSY Left 07/21/2017   Procedure: retrogrades, URETEROSCOPY WITH [place] basketry, stent placement;  Surgeon: Ardis Hughs, MD;  Location: [facility];  Service: Urology;  Laterality: Left;    There were no vitals filed for this visit.   Subjective Assessment - 12/03/21 1623     Subjective Reports she has been pain free since last session.  Does reports some discomfort while sitting or transitioning but not painful unless doing those activities.    How long can you sit comfortably? Able to sit comfortably for 5 minutes on a hard chair, softer chair hours    How long can you stand comfortably? Able to stand for 20-30 minutes standing tall, increased pain washing dishes.    How long can  you walk comfortably? Able to walk comfortably for 30 minutes unless pushing something.    Patient Stated Goals Decreased pain and sciatica    Currently in Pain? No/denies                [facility] PT Assessment - 12/03/21 0001       Assessment   Medical Diagnosis LBP    Referring Provider (PT) Dr Luna Glasgow    Onset Date/Surgical Date 09/27/21    Next MD Visit 12/10/21      Observation/Other Assessments   Focus on Therapeutic Outcomes (FOTO)  10th visit: 59.7% functional; was: 50% FS (was 47% functional status)      Strength   Right Hip Flexion 5/5    Right Hip Extension 5/5    Right Hip ABduction 5/5    Left Hip Flexion 5/5    Left Hip Extension 4+/5   pain free                          [facility] Adult PT Treatment/Exercise - 12/03/21 0001       Lumbar Exercises: Aerobic   Tread Mill 4' with ab set 1.5 1% elevation      Lumbar Exercises: Standing  Functional Squats 10 reps    Functional Squats Limitations 2 sets3    Other Standing Lumbar Exercises dead lift 2# bar; standing infront of counter reaching forward to simulate dishes    Other Standing Lumbar Exercises paloff sidestep GTB 10x each direction      Lumbar Exercises: Seated   Sit to Stand 10 reps      Lumbar Exercises: Supine   Bridge 15 reps    Bridge Limitations Lt LE closer 5" holds    Straight Leg Raise 10 reps    Straight Leg Raises Limitations Rt only following MET      Manual Therapy   Manual Therapy Muscle Energy Technique;Soft tissue mobilization    Manual therapy comments completed seperate from all other aspects of treatment    Muscle Energy Technique Anterior rotation of Lt SI                       PT Short Term Goals - 12/03/21 1625       PT SHORT TERM GOAL #1   Title Patient will be independent with initial HEP and self-management strategies to improve functional outcomes    Baseline 12/03/21: Reorts compliance with HEP daily               PT Long Term Goals  - 12/03/21 1633       PT LONG TERM GOAL #1   Title Patient will have equal to or > 4+/5 MMT bilateral hip extension to improve ability to perform functional mobility, stair ambulation and ADLs.    Baseline 12/03/21      PT LONG TERM GOAL #2   Title Patient will report at least 75% overall improvement in subjective complaint to indicate improvement in ability to perform ADLs.    Baseline 12/03/21:  Reports improvements by 75%    Status Achieved      PT LONG TERM GOAL #3   Title Patient will improve FOTO score to predicted value to indicate improvement in functional outcomes      PT LONG TERM GOAL #4   Title Patient will be independent with advanced HEP and self-management strategies to improve functional outcomes    Baseline 12/03/21:  Reports compliance iwth advanced HEP    Status Achieved                   Plan - 12/03/21 1704     Clinical Impression Statement Checked SI with Lt SI anterior rotation, MET complete to improve alignment wiht core/proximal strengthening following.  Pt educated on self MET to complete at home when she notices it out of alignment, verbalized understanding.  Session focus on improve functional movements with core strengthening, cueing for core engangement that was tolerated well.  Pt stated most difficulty reaching forward to complete dishes or push walker.    Examination-Activity Limitations Lift;Stand;Locomotion Level;Transfers;Squat    Examination-Participation Restrictions Cleaning;Laundry;Shop;Occupation;Community Activity;Yard Work    Stability/Clinical Decision Making Stable/Uncomplicated    Designer, jewellery Low    Rehab Potential Good    PT Frequency 2x / week    PT Duration 4 weeks    PT Treatment/Interventions ADLs/Self Care Home Management;Biofeedback;Traction;Moist Heat;Iontophoresis 34m/ml Dexamethasone;Gait training;DME Instruction;Balance training;Neuromuscular re-education;Scar mobilization;Passive range of  motion;Visual/perceptual remediation/compensation;Dry needling;Manual techniques;Functional mobility training;Stair training;Ultrasound;Cryotherapy;Parrafin;Fluidtherapy;Electrical Stimulation;Contrast Bath;Therapeutic exercise;Therapeutic activities;Patient/family education;Manual lymph drainage;Energy conservation;Spinal Manipulations;Splinting;Joint Manipulations;Taping;Orthotic Fit/Training;Vasopneumatic Device;Compression bandaging    PT Next Visit Plan Check SIJ with MET as needed.  Progress core strength with functional movements to enable ability to  wash dishes/push buggie without increased pain.    PT Home Exercise Plan Eval: glute set, ab set, supine clam; 12/29: bridge, isometric hip ab/adduction, SL clam;1/3:  heel raises; functional squat sit to stand  1/18:  STS with Lt closer to body to increase weight shift/use of glute, Lt LE bridge; prone hip extension w/ knee bent, prone and quaraped arm raise, prone and quadraped leg raise, prone alt arm/leg raise.  1/31:  seated piriformis stretch, prone hip extension    Consulted and Agree with Plan of Care Patient             Patient will benefit from skilled therapeutic intervention in order to improve the following deficits and impairments:  Abnormal gait, Increased fascial restricitons, Impaired sensation, Pain, Improper body mechanics, Decreased mobility, Increased muscle spasms, Decreased activity tolerance, Decreased range of motion, Decreased strength, Impaired flexibility  Visit Diagnosis: Low back pain, unspecified back pain laterality, unspecified chronicity, unspecified whether sciatica present     Problem List Patient Active Problem List   Diagnosis Date Noted   Laceration of head and neck 10/11/2020   Irregular periods 12/14/2016   RLQ abdominal pain 11/30/2016   Missed periods 11/30/2016   Right ovarian cyst 11/30/2016   Urinary frequency 05/27/2015   Pelvic pain in female 05/27/2015   UTI (lower urinary tract  infection) 04/28/2015   Ihor Austin, LPTA/CLT; CBIS [phone]  Aldona Lento, PTA 12/03/2021, 5:13 PM  [place] [address] [place], [place], [address] Phone: [phone]   Fax:  [phone]  Name: Cathy Wells MRN: [id number] Date of Birth: 31-Jan-1991

## 2021-12-09 ENCOUNTER — Ambulatory Visit (HOSPITAL_COMMUNITY): Payer: BC Managed Care – PPO | Admitting: Physical Therapy

## 2021-12-10 ENCOUNTER — Encounter: Payer: Self-pay | Admitting: Orthopaedic Surgery

## 2021-12-10 ENCOUNTER — Ambulatory Visit (INDEPENDENT_AMBULATORY_CARE_PROVIDER_SITE_OTHER): Payer: BC Managed Care – PPO | Admitting: Orthopaedic Surgery

## 2021-12-10 ENCOUNTER — Other Ambulatory Visit: Payer: Self-pay

## 2021-12-10 DIAGNOSIS — M5442 Lumbago with sciatica, left side: Secondary | ICD-10-CM

## 2021-12-10 NOTE — Progress Notes (Signed)
Virtual Visit via Telephone Note  I connected withNAME@ on 12/10/21 at 10:10 AM EST by telephone and verified that I am speaking with the correct person using two identifiers.  Location: Patient: home Provider: office   I discussed the limitations, risks, security and privacy concerns of performing an evaluation and management service by telephone and the availability of in person appointments. I also discussed with the patient that there may be a patient responsible charge related to this service. The patient expressed understanding and agreed to proceed.   History of Present Illness: She has been to PT.  I have reviewed the notes.  She is making good progress and has much less pain.  She has no numbness or weakness now.  She is pleased.   Observations/Objective: Per above.  Assessment and Plan: Encounter Diagnosis  Name Primary?   Acute midline low back pain with left-sided sciatica Yes      Follow Up Instructions: Continue PT and her exercises.  I can do televisit in two weeks.     I discussed the assessment and treatment plan with the patient. The patient was provided an opportunity to ask questions and all were answered. The patient agreed with the plan and demonstrated an understanding of the instructions.   The patient was advised to call back or seek an in-person evaluation if the symptoms worsen or if the condition fails to improve as anticipated.  I provided 8 minutes of non-face-to-face time during this encounter.   Darreld Mclean, MD

## 2021-12-11 ENCOUNTER — Ambulatory Visit (HOSPITAL_COMMUNITY): Payer: BC Managed Care – PPO | Admitting: Physical Therapy

## 2021-12-11 ENCOUNTER — Encounter: Payer: Self-pay | Admitting: Orthopaedic Surgery

## 2021-12-11 DIAGNOSIS — M545 Low back pain, unspecified: Secondary | ICD-10-CM | POA: Diagnosis not present

## 2021-12-11 NOTE — Therapy (Signed)
[place], [place], [address] Phone: [phone]   Fax:  [phone]  Physical Therapy Treatment  Patient Details  Name: Cathy Wells MRN: [id number] Date of Birth: February 22, 1991 Referring Provider (PT): Dr Luna Glasgow   Encounter Date: 12/11/2021   PT End of Session - 12/11/21 1405     Visit Number 11    Number of Visits 14    Date for PT Re-Evaluation 12/23/21    Authorization Type BCBS COMM PPO (30 VL)    Authorization - Visit Number 11    Authorization - Number of Visits 30    Progress Note Due on Visit 14    Activity Tolerance Patient tolerated treatment well    Behavior During Therapy [facility] for tasks assessed/performed             Past Medical History:  Diagnosis Date   Complication of anesthesia    PONV   Frequency of urination    GERD (gastroesophageal reflux disease)    Hematuria    Left ureteral [place]    Mild asthma    Ovarian cyst    Urgency of urination    Wears contact lenses     Past Surgical History:  Procedure Laterality Date   APPENDECTOMY  07/25/2006   OPEN   URETEROSCOPY WITH HOLMIUM LASER LITHOTRIPSY Left 07/21/2017   Procedure: retrogrades, URETEROSCOPY WITH [place] basketry, stent placement;  Surgeon: Ardis Hughs, MD;  Location: [facility];  Service: Urology;  Laterality: Left;    There were no vitals filed for this visit.   Subjective Assessment - 12/11/21 1406     Subjective Pt states that she is having more pain on the LT of her back.  She has no questions on her exercises.    How long can you sit comfortably? Able to sit comfortably for 5 minutes on a hard chair, softer chair hours    How long can you stand comfortably? Able to stand for 20-30 minutes standing tall, increased pain washing dishes.    How long can you walk comfortably? Able to walk comfortably for 30 minutes unless pushing something.    Patient Stated Goals Decreased pain and sciatica     Currently in Pain? Yes    Pain Score 4     Pain Location Back    Pain Orientation Lower    Pain Descriptors / Indicators Aching    Pain Type Acute pain    Pain Onset More than a month ago    Pain Frequency Intermittent    Aggravating Factors  sitting    Pain Relieving Factors layiing down with leg elevated.                               [place] Adult PT Treatment/Exercise - 12/11/21 0001       Lumbar Exercises: Stretches   Prone on Elbows Stretch 2 reps;30 seconds    Press Ups 5 reps      Lumbar Exercises: Standing   Scapular Retraction Strengthening;Both;10 reps    Theraband Level (Scapular Retraction) Level 3 (Green)    Row Strengthening;Both;10 reps    Theraband Level (Row) Level 3 (Green)    Shoulder Extension Strengthening;Both;10 reps;Theraband    Theraband Level (Shoulder Extension) Level 3 (Green)    Other Standing Lumbar Exercises paloff sidestep GTB 10x each direction      Lumbar Exercises: Seated   Sit to Stand  15 reps      Lumbar Exercises: Supine   Bridge 10 reps      Lumbar Exercises: Prone   Straight Leg Raise 10 reps    Other Prone Lumbar Exercises glut set x 10      Lumbar Exercises: Quadruped   Straight Leg Raise 10 reps   on forearms for wrist comfort     Manual Therapy   Manual Therapy Muscle Energy Technique;Soft tissue mobilization    Manual therapy comments completed seperate from all other aspects of treatment    Muscle Energy Technique Anterior rotation of Lt SI                       PT Short Term Goals - 12/03/21 1625       PT SHORT TERM GOAL #1   Title Patient will be independent with initial HEP and self-management strategies to improve functional outcomes    Baseline 12/03/21: Reorts compliance with HEP daily               PT Long Term Goals - 12/03/21 1633       PT LONG TERM GOAL #1   Title Patient will have equal to or > 4+/5 MMT bilateral hip extension to improve ability to perform  functional mobility, stair ambulation and ADLs.    Baseline 12/03/21      PT LONG TERM GOAL #2   Title Patient will report at least 75% overall improvement in subjective complaint to indicate improvement in ability to perform ADLs.    Baseline 12/03/21:  Reports improvements by 75%    Status Achieved      PT LONG TERM GOAL #3   Title Patient will improve FOTO score to predicted value to indicate improvement in functional outcomes      PT LONG TERM GOAL #4   Title Patient will be independent with advanced HEP and self-management strategies to improve functional outcomes    Baseline 12/03/21:  Reports compliance iwth advanced HEP    Status Achieved                   Plan - 12/11/21 1426     Clinical Impression Statement Corrected SI dysfunction with mm energy techniques followed by extension based exercises and core strengthening.  PT vocalized no pain at end of session.  Suggested pt takes standing extension breaks at work as she is sitting most of the time.    Examination-Activity Limitations Lift;Stand;Locomotion Level;Transfers;Squat    Examination-Participation Restrictions Cleaning;Laundry;Shop;Occupation;Community Activity;Yard Work    Stability/Clinical Decision Making Stable/Uncomplicated    Rehab Potential Good    PT Frequency 2x / week    PT Duration 4 weeks    PT Treatment/Interventions ADLs/Self Care Home Management;Biofeedback;Traction;Moist Heat;Iontophoresis 69m/ml Dexamethasone;Gait training;DME Instruction;Balance training;Neuromuscular re-education;Scar mobilization;Passive range of motion;Visual/perceptual remediation/compensation;Dry needling;Manual techniques;Functional mobility training;Stair training;Ultrasound;Cryotherapy;Parrafin;Fluidtherapy;Electrical Stimulation;Contrast Bath;Therapeutic exercise;Therapeutic activities;Patient/family education;Manual lymph drainage;Energy conservation;Spinal Manipulations;Splinting;Joint Manipulations;Taping;Orthotic  Fit/Training;Vasopneumatic Device;Compression bandaging    PT Next Visit Plan Check SIJ with MET as needed.  Progress core strength with functional movements to enable ability to wash dishes/push buggie without increased pain.    PT Home Exercise Plan Eval: glute set, ab set, supine clam; 12/29: bridge, isometric hip ab/adduction, SL clam;1/3:  heel raises; functional squat sit to stand  1/18:  STS with Lt closer to body to increase weight shift/use of glute, Lt LE bridge; prone hip extension w/ knee bent, prone and quaraped arm raise, prone and quadraped leg raise, prone alt arm/leg  raise.  1/31:  seated piriformis stretch, prone hip extension.[id number] extension based program    Consulted and Agree with Plan of Care Patient             Patient will benefit from skilled therapeutic intervention in order to improve the following deficits and impairments:  Abnormal gait, Increased fascial restricitons, Impaired sensation, Pain, Improper body mechanics, Decreased mobility, Increased muscle spasms, Decreased activity tolerance, Decreased range of motion, Decreased strength, Impaired flexibility  Visit Diagnosis: Low back pain, unspecified back pain laterality, unspecified chronicity, unspecified whether sciatica present     Problem List Patient Active Problem List   Diagnosis Date Noted   Laceration of head and neck 10/11/2020   Irregular periods 12/14/2016   RLQ abdominal pain 11/30/2016   Missed periods 11/30/2016   Right ovarian cyst 11/30/2016   Urinary frequency 05/27/2015   Pelvic pain in female 05/27/2015   UTI (lower urinary tract infection) 04/28/2015  Rayetta Humphrey, PT CLT [phone]  12/11/2021, 2:42 PM  [place] [address] [place], [place], [address] Phone: [phone]   Fax:  [phone]  Name: Cathy Wells MRN: [id number] Date of Birth: [birth year]

## 2021-12-15 ENCOUNTER — Ambulatory Visit (HOSPITAL_COMMUNITY): Payer: BC Managed Care – PPO | Admitting: Physical Therapy

## 2021-12-15 ENCOUNTER — Other Ambulatory Visit: Payer: Self-pay

## 2021-12-15 DIAGNOSIS — M545 Low back pain, unspecified: Secondary | ICD-10-CM

## 2021-12-15 NOTE — Therapy (Signed)
[place], [place], [address] Phone: [phone]   Fax:  [phone]  Physical Therapy Treatment  Patient Details  Name: Cathy Wells MRN: [id number] Date of Birth: 24-Mar-1991 Referring Provider (PT): Dr Luna Glasgow   Encounter Date: 12/15/2021   PT End of Session - 12/15/21 1718     Visit Number 12    Number of Visits 14    Date for PT Re-Evaluation 12/23/21    Authorization Type BCBS COMM PPO (30 VL)    Authorization - Visit Number 12    Authorization - Number of Visits 30    Progress Note Due on Visit 14    PT Start Time 1620    PT Stop Time 1705    PT Time Calculation (min) 45 min    Activity Tolerance Patient tolerated treatment well    Behavior During Therapy [facility] for tasks assessed/performed             Past Medical History:  Diagnosis Date   Complication of anesthesia    PONV   Frequency of urination    GERD (gastroesophageal reflux disease)    Hematuria    Left ureteral Schuchard    Mild asthma    Ovarian cyst    Urgency of urination    Wears contact lenses     Past Surgical History:  Procedure Laterality Date   APPENDECTOMY  07/25/2006   OPEN   URETEROSCOPY WITH HOLMIUM LASER LITHOTRIPSY Left 07/21/2017   Procedure: retrogrades, URETEROSCOPY WITH Chaffin basketry, stent placement;  Surgeon: Ardis Hughs, MD;  Location[facility];  Service: Urology;  Laterality: Left;    There were no vitals filed for this visit.   Subjective Assessment - 12/15/21 1631     Subjective Pt reports a little aggrevation 2-3 and was really bothering yesterday when she returned to full day at work.  STates standing after sitting a while and gets up really hurts    Currently in Pain? Yes    Pain Score 3     Pain Location Back    Pain Orientation Mid;Lower                               [facility] Adult PT Treatment/Exercise - 12/15/21 0001       Lumbar Exercises:  Standing   Scapular Retraction Strengthening;Both;15 reps    Theraband Level (Scapular Retraction) Level 3 (Green)    Scapular Retraction Limitations 2 sets of 15    Row Strengthening;Both;15 reps    Theraband Level (Row) Level 3 (Green)    Row Limitations 2 sets of 15    Shoulder Extension Strengthening;Both;Theraband;15 reps    Theraband Level (Shoulder Extension) Level 3 (Green)    Shoulder Extension Limitations 2 sets of 15    Other Standing Lumbar Exercises cable pulls 4plates 3Rt  each way    Other Standing Lumbar Exercises paloff sidestep GTB 15x each direction      Lumbar Exercises: Supine   Bridge 10 reps    Bridge Limitations Lt LE closer 5" holds    Straight Leg Raise 10 reps      Manual Therapy   Manual Therapy Muscle Energy Technique;Soft tissue mobilization    Manual therapy comments completed seperate from all other aspects of treatment    Muscle Energy Technique Anterior rotation of Lt SI  PT Education - 12/15/21 1716     Education Details standing every 15-20 minutes at work, [employer] manager support and footstool for office chair.  Encoruaged to complete stretches including hamstring, piriformis and standing lumbar extension at work    [employer]) Educated Patient    Methods Explanation    Comprehension Verbalized understanding              PT Short Term Goals - 12/03/21 1625       PT SHORT TERM GOAL #1   Title Patient will be independent with initial HEP and self-management strategies to improve functional outcomes    Baseline 12/03/21: Reorts compliance with HEP daily               PT Long Term Goals - 12/03/21 1633       PT LONG TERM GOAL #1   Title Patient will have equal to or > 4+/5 MMT bilateral hip extension to improve ability to perform functional mobility, stair ambulation and ADLs.    Baseline 12/03/21      PT LONG TERM GOAL #2   Title Patient will report at least 75% overall improvement in subjective  complaint to indicate improvement in ability to perform ADLs.    Baseline 12/03/21:  Reports improvements by 75%    Status Achieved      PT LONG TERM GOAL #3   Title Patient will improve FOTO score to predicted value to indicate improvement in functional outcomes      PT LONG TERM GOAL #4   Title Patient will be independent with advanced HEP and self-management strategies to improve functional outcomes    Baseline 12/03/21:  Reports compliance iwth advanced HEP    Status Achieved                   Plan - 12/15/21 1716     Clinical Impression Statement Pt continues to remain in Lt anterior rotation and reports that her husband is now able to complete the Muscle energy technique at home.  Educated pt. on reducing the amount of time she remains in one position, encouraging to stand every 15-20 minutes to stretch and walk around.  Added pulley walks in all directions with 40# resistance.  Forward walking was most challenging for patient.  Pt reported fatigue at end of session today and remained pain free following muscle energy.  Suggested pt. purchase a lumbar support and foot stool for her chair at work.    Examination-Activity Limitations Lift;Stand;Locomotion Level;Transfers;Squat    Examination-Participation Restrictions Cleaning;Laundry;Shop;Occupation;Community Activity;Yard Work    Stability/Clinical Decision Making Stable/Uncomplicated    Rehab Potential Good    PT Frequency 2x / week    PT Duration 4 weeks    PT Treatment/Interventions ADLs/Self Care Home Management;Biofeedback;Traction;Moist Heat;Iontophoresis 33m/ml Dexamethasone;Gait training;DME Instruction;Balance training;Neuromuscular re-education;Scar mobilization;Passive range of motion;Visual/perceptual remediation/compensation;Dry needling;Manual techniques;Functional mobility training;Stair training;Ultrasound;Cryotherapy;Parrafin;Fluidtherapy;Electrical Stimulation;Contrast Bath;Therapeutic exercise;Therapeutic  activities;Patient/family education;Manual lymph drainage;Energy conservation;Spinal Manipulations;Splinting;Joint Manipulations;Taping;Orthotic Fit/Training;Vasopneumatic Device;Compression bandaging    PT Next Visit Plan Check SIJ with MET as needed.  Progress core strength with functional movements to enable ability to wash dishes/push buggie without increased pain.  Re evaluate next session.    PT Home Exercise Plan Eval: glute set, ab set, supine clam; 12/29: bridge, isometric hip ab/adduction, SL clam;1/3:  heel raises; functional squat sit to stand  1/18:  STS with Lt closer to body to increase weight shift/use of glute, Lt LE bridge; prone hip extension w/ knee bent, prone and quaraped arm raise, prone and  quadraped leg raise, prone alt arm/leg raise.  1/31:  seated piriformis stretch, prone hip extension.[id number] extension based program    Consulted and Agree with Plan of Care Patient             Patient will benefit from skilled therapeutic intervention in order to improve the following deficits and impairments:  Abnormal gait, Increased fascial restricitons, Impaired sensation, Pain, Improper body mechanics, Decreased mobility, Increased muscle spasms, Decreased activity tolerance, Decreased range of motion, Decreased strength, Impaired flexibility  Visit Diagnosis: Low back pain, unspecified back pain laterality, unspecified chronicity, unspecified whether sciatica present     Problem List Patient Active Problem List   Diagnosis Date Noted   Laceration of head and neck 10/11/2020   Irregular periods 12/14/2016   RLQ abdominal pain 11/30/2016   Missed periods 11/30/2016   Right ovarian cyst 11/30/2016   Urinary frequency 05/27/2015   Pelvic pain in female 05/27/2015   UTI (lower urinary tract infection) 04/28/2015   Teena Irani, PTA/CLT, WTA [phone]  Teena Irani, PTA 12/15/2021, 5:19 PM  [place], [place], [address] Phone: [phone]   Fax:  [phone]  Name: Cathy Wells MRN: [id number] Date of Birth: 1990/11/26

## 2021-12-16 ENCOUNTER — Ambulatory Visit (HOSPITAL_COMMUNITY): Payer: BC Managed Care – PPO | Admitting: Physical Therapy

## 2021-12-16 DIAGNOSIS — M545 Low back pain, unspecified: Secondary | ICD-10-CM

## 2021-12-16 NOTE — Therapy (Addendum)
[place], [place], [address] Phone: [phone]   Fax:  [phone]  Physical Therapy Treatment  Patient Details  Name: Cathy Wells MRN: [id number] Date of Birth: 11-15-1990 Referring Provider (PT): Dr Luna Glasgow  PHYSICAL THERAPY DISCHARGE SUMMARY  Visits from Start of Care: 13  Current functional level related to goals / functional outcomes: See below    Remaining deficits: See below    Education / Equipment: See assessment    Patient agrees to discharge. Patient goals were partially met. Patient is being discharged due to being pleased with the current functional level.   Encounter Date: 12/16/2021   PT End of Session - 12/16/21 1724     Visit Number 13    Number of Visits 14    Date for PT Re-Evaluation 12/23/21    Authorization Type BCBS COMM PPO (30 VL)    Authorization - Visit Number 13    Authorization - Number of Visits 30    Progress Note Due on Visit 14    PT Start Time 1620    PT Stop Time 1700    PT Time Calculation (min) 40 min    Activity Tolerance Patient tolerated treatment well    Behavior During Therapy WFL for tasks assessed/performed             Past Medical History:  Diagnosis Date   Complication of anesthesia    PONV   Frequency of urination    GERD (gastroesophageal reflux disease)    Hematuria    Left ureteral Mederos    Mild asthma    Ovarian cyst    Urgency of urination    Wears contact lenses     Past Surgical History:  Procedure Laterality Date   APPENDECTOMY  07/25/2006   OPEN   URETEROSCOPY WITH HOLMIUM LASER LITHOTRIPSY Left 07/21/2017   Procedure: retrogrades, URETEROSCOPY WITH Helms basketry, stent placement;  Surgeon: Ardis Hughs, MD;  Location: [facility];  Service: Urology;  Laterality: Left;    There were no vitals filed for this visit.   Subjective Assessment - 12/16/21 1626     Subjective Pt states she's just a  little aggrevated at a 2/10 with highest getting to 10/10 approx 3 days of the past 4 weeks and 75% of this time being painfree. Pt feels overall 80% improved.    Currently in Pain? Yes    Pain Score 2     Pain Location Back    Pain Orientation Lower    Pain Descriptors / Indicators Aching                [facility] PT Assessment - 12/16/21 1632       Assessment   Medical Diagnosis LBP    Referring Provider (PT) Dr Luna Glasgow    Onset Date/Surgical Date 09/27/21    Next MD Visit 12/22/21    Prior Therapy Yes      Precautions   Precautions None      Restrictions   Weight Bearing Restrictions No      Home Environment   Living Environment Private residence      Prior Function   Level of Independence Independent      Cognition   Overall Cognitive Status Within Functional Limits for tasks assessed      Observation/Other Assessments   Focus on Therapeutic Outcomes (FOTO)  57.2% FS (was 47% functional status)      AROM   Lumbar Flexion WNL (  was 50% limited    Lumbar Extension WNL (was 25% limited)    Lumbar - Right Side Bend WNL    Lumbar - Left Side Bend WNL with only discomfort in end ROM      Strength   Right Hip Flexion 5/5   was 5/5   Right Hip Extension 5/5   was 4/5 with pain   Right Hip ABduction 5/5    Left Hip Flexion 5/5    Left Hip Extension 4+/5   pain   Left Hip ABduction 5/5    Right Knee Extension 5/5    Left Knee Extension 5/5      Palpation   Palpation comment increased TTP about sacrum                           [facility] Adult PT Treatment/Exercise - 12/16/21 0001       Lumbar Exercises: Standing   Other Standing Lumbar Exercises vectors LT LE for glute      Lumbar Exercises: Supine   Bridge 10 reps    Bridge Limitations Lt LE closer 5" holds      Manual Therapy   Manual Therapy Muscle Energy Technique;Soft tissue mobilization    Manual therapy comments completed seperate from all other aspects of treatment    Muscle Energy  Technique Anterior rotation of Lt SI                     PT Education - 12/16/21 1729     Education Details returning to walking program/gym.  Working on LT gluteal weakness; lumbar support and keeping feet from dangling with support while seated.    Person(s) Educated Patient    Methods Explanation;Demonstration    Comprehension Verbalized understanding;Returned demonstration              PT Short Term Goals - 12/16/21 1643       PT SHORT TERM GOAL #1   Title Patient will be independent with initial HEP and self-management strategies to improve functional outcomes    Baseline 12/03/21: Reorts compliance with HEP daily    Status Achieved               PT Long Term Goals - 12/16/21 1643       PT LONG TERM GOAL #1   Title Patient will have equal to or > 4+/5 MMT bilateral hip extension to improve ability to perform functional mobility, stair ambulation and ADLs.    Baseline 12/03/21    Status Achieved      PT LONG TERM GOAL #2   Title Patient will report at least 75% overall improvement in subjective complaint to indicate improvement in ability to perform ADLs.    Baseline 12/03/21:  Reports improvements by 75%    Status Achieved      PT LONG TERM GOAL #3   Title Patient will improve FOTO score to predicted value to indicate improvement in functional outcomes    Status Not Met      PT LONG TERM GOAL #4   Title Patient will be independent with advanced HEP and self-management strategies to improve functional outcomes    Baseline 12/03/21:  Reports compliance iwth advanced HEP    Status Achieved                   Plan - 12/16/21 1729     Clinical Impression Statement PT now with spinal motion WNL and just  slight weakness in Lt gluteal.  Pt continues to remain in Lt anterior rotation and reports that her husband is now able to complete the Muscle energy technique at home.  Educated pt. to continue with reducing the amount of time she remains in one  position as this seemed to help today at work.  Demonstrated lumbar roll this session and encouraged to purchase this as well.  Pt has returned to working full days at this time and is only having pain 25%  as compared to 100% in the beginning.  Pt is completing her HEP independently and plans on returning to walking program and/or the local gym.  Pt feels she is now able to be discharged from skilled therapy    Examination-Activity Limitations Lift;Stand;Locomotion Level;Transfers;Squat    Examination-Participation Restrictions Cleaning;Laundry;Shop;Occupation;Community Activity;Yard Work    Stability/Clinical Decision Making Stable/Uncomplicated    Rehab Potential Good    PT Frequency 2x / week    PT Duration 4 weeks    PT Treatment/Interventions ADLs/Self Care Home Management;Biofeedback;Traction;Moist Heat;Iontophoresis 26m/ml Dexamethasone;Gait training;DME Instruction;Balance training;Neuromuscular re-education;Scar mobilization;Passive range of motion;Visual/perceptual remediation/compensation;Dry needling;Manual techniques;Functional mobility training;Stair training;Ultrasound;Cryotherapy;Parrafin;Fluidtherapy;Electrical Stimulation;Contrast Bath;Therapeutic exercise;Therapeutic activities;Patient/family education;Manual lymph drainage;Energy conservation;Spinal Manipulations;Splinting;Joint Manipulations;Taping;Orthotic Fit/Training;Vasopneumatic Device;Compression bandaging    PT Next Visit Plan Discharge to self care/HEP.    PT Home Exercise Plan Eval: glute set, ab set, supine clam; 12/29: bridge, isometric hip ab/adduction, SL clam;1/3:  heel raises; functional squat sit to stand  1/18:  STS with Lt closer to body to increase weight shift/use of glute, Lt LE bridge; prone hip extension w/ knee bent, prone and quaraped arm raise, prone and quadraped leg raise, prone alt arm/leg raise.  1/31:  seated piriformis stretch, prone hip extension.[id number] based program    Consulted and Agree  with Plan of Care Patient             Patient will benefit from skilled therapeutic intervention in order to improve the following deficits and impairments:  Abnormal gait, Increased fascial restricitons, Impaired sensation, Pain, Improper body mechanics, Decreased mobility, Increased muscle spasms, Decreased activity tolerance, Decreased range of motion, Decreased strength, Impaired flexibility  Visit Diagnosis: Low back pain, unspecified back pain laterality, unspecified chronicity, unspecified whether sciatica present     Problem List Patient Active Problem List   Diagnosis Date Noted   Laceration of head and neck 10/11/2020   Irregular periods 12/14/2016   RLQ abdominal pain 11/30/2016   Missed periods 11/30/2016   Right ovarian cyst 11/30/2016   Urinary frequency 05/27/2015   Pelvic pain in female 05/27/2015   UTI (lower urinary tract infection) 04/28/2015   ATeena Irani PTA/CLT, WTA [phone] FTeena Irani PTA 12/16/2021, 5:34 PM  [address] [address]Phone: [phone]  Fax:  [phone] Name: Cathy MONTUFARMRN: [id number] of Birth: [birth year]

## 2021-12-17 ENCOUNTER — Encounter (HOSPITAL_COMMUNITY): Payer: BC Managed Care – PPO | Admitting: Physical Therapy

## 2021-12-22 ENCOUNTER — Other Ambulatory Visit: Payer: Self-pay

## 2021-12-22 ENCOUNTER — Encounter: Payer: Self-pay | Admitting: Orthopaedic Surgery

## 2021-12-22 ENCOUNTER — Ambulatory Visit: Payer: BC Managed Care – PPO | Admitting: Orthopaedic Surgery

## 2021-12-22 VITALS — BP 130/96 | HR 83 | Ht 62.0 in | Wt 238.0 lb

## 2021-12-22 DIAGNOSIS — M533 Sacrococcygeal disorders, not elsewhere classified: Secondary | ICD-10-CM

## 2021-12-22 DIAGNOSIS — G8929 Other chronic pain: Secondary | ICD-10-CM

## 2021-12-22 NOTE — Progress Notes (Signed)
I still hurt.  She continues to have left sided lower back and SI joint pain.  She has had a MRI which was negative.  She has just completed PT and they figure she has more SI joint pain on the left than lower back pain.  She has episodes of pain after standing a while.  She has no new trauma, no paresthesias.  She is tender over the left SI joint.  NV intact. ROM of back is full.    Encounter Diagnosis  Name Primary?   Chronic left SI joint pain Yes   I will have Dr. Alvester Morin see her for injection of the SI joint on the left.  She is agreeable to this.  Stop PT.  Return in six weeks.  Call if any problem.  Precautions discussed.  Electronically Signed Darreld Mclean, MD 2/21/20239:18 AM

## 2021-12-22 NOTE — Patient Instructions (Signed)
Driving Directions to [employer] from [employer] address is [place] The phone number is [phone] / they will call you to make the appointment for the injection/ Dr Laurence Spates   1. Start out going [place] on [address] toward [address].  Then 0.02 miles0.02 total miles 2. Take the 1st right onto [address]. Continue to follow [address].  If you reach [facility] you've gone a little too far  Then 0.58 miles0.60 total miles 3. Turn right onto [place] is just past [facility]  Then 2.25 miles2.85 total miles 4. Take the [address] ramp toward [place].  Then 0.25 miles3.10 total miles 5. Merge onto [address]  Then 18.17 miles21.28 total miles 6. Merge onto [address].  Then 1.47 miles22.74 total miles 7. Turn right onto [place] is just past [place]  Then 0.11 miles22.85 total miles  8. [address], [place] [id number], [place] is on the left.

## 2021-12-24 ENCOUNTER — Ambulatory Visit: Payer: BC Managed Care – PPO | Admitting: Orthopaedic Surgery

## 2021-12-24 ENCOUNTER — Telehealth: Payer: Self-pay

## 2021-12-24 DIAGNOSIS — F411 Generalized anxiety disorder: Secondary | ICD-10-CM

## 2021-12-24 NOTE — Telephone Encounter (Signed)
Pt has requested Rx for her appt on 01/11/22, Please advise.

## 2021-12-29 DIAGNOSIS — J111 Influenza due to unidentified influenza virus with other respiratory manifestations: Secondary | ICD-10-CM | POA: Diagnosis not present

## 2022-01-01 DIAGNOSIS — F419 Anxiety disorder, unspecified: Secondary | ICD-10-CM | POA: Diagnosis not present

## 2022-01-01 DIAGNOSIS — J069 Acute upper respiratory infection, unspecified: Secondary | ICD-10-CM | POA: Diagnosis not present

## 2022-01-04 MED ORDER — DIAZEPAM 5 MG PO TABS: ORAL_TABLET | ORAL | 0 refills | Status: DC

## 2022-01-04 NOTE — Addendum Note (Signed)
Addended by: Ashok Norris on: 01/04/2022 12:53 PM ? ? Modules accepted: Orders ? ?

## 2022-01-04 NOTE — Telephone Encounter (Signed)
Pre-procedure diazepam ordered for pre-operative anxiety.  

## 2022-01-11 ENCOUNTER — Other Ambulatory Visit: Payer: Self-pay

## 2022-01-11 ENCOUNTER — Ambulatory Visit (INDEPENDENT_AMBULATORY_CARE_PROVIDER_SITE_OTHER): Payer: BC Managed Care – PPO | Admitting: Physical Medicine and Rehabilitation

## 2022-01-11 ENCOUNTER — Ambulatory Visit: Payer: Self-pay

## 2022-01-11 ENCOUNTER — Encounter: Payer: Self-pay | Admitting: Physical Medicine and Rehabilitation

## 2022-01-11 DIAGNOSIS — M461 Sacroiliitis, not elsewhere classified: Secondary | ICD-10-CM | POA: Diagnosis not present

## 2022-01-11 MED ORDER — METHYLPREDNISOLONE ACETATE 80 MG/ML IJ SUSP
80.0000 mg | INTRAMUSCULAR | Status: AC | PRN
  Administered 2022-01-11: 80 mg via INTRA_ARTICULAR

## 2022-01-11 MED ORDER — BUPIVACAINE HCL 0.5 % IJ SOLN
2.0000 mL | INTRAMUSCULAR | Status: AC | PRN
  Administered 2022-01-11: 2 mL via INTRA_ARTICULAR

## 2022-01-11 NOTE — Progress Notes (Signed)
Pt state lower back pain that travels to her toes. Pt state sitting and walking makes the pain worse. Pt state she takes pain meds to help ease her pain. ? ?Numeric Pain Rating Scale and Functional Assessment ?Average Pain 2 ? ? ?In the last MONTH (on 0-10 scale) has pain interfered with the following? ? ?1. General activity like being  able to carry out your everyday physical activities such as walking, climbing stairs, carrying groceries, or moving a chair?  ?Rating(8) ? ? ?+Driver, -BT, -Dye Allergies. ? ?

## 2022-01-11 NOTE — Progress Notes (Signed)
? ?Cathy Wells - 30 y.o. female MRN [id number]  Date of birth: [birth year] ? ?Office Visit Note: ?Visit Date: 01/11/2022 ?PCP: Cory Munch, PA-C ?Referred by: Cory Munch, PA-C ? ?Subjective: ?Chief Complaint  ?Patient presents with  ? Lower Back - Pain  ? Left Foot - Tingling  ? Right Foot - Tingling  ? ?HPI:  Cathy Wells is a 31 y.o. female who comes in today at the request of Dr. Sanjuana Kava for planned Left anesthetic Sacroiliac Joint arthrogram with fluoroscopic guidance.  The patient has failed conservative care including home exercise, medications, time and activity modification.  This injection will be diagnostic and hopefully therapeutic.  Please see requesting physician notes for further details and justification. ? ?ROS Otherwise per HPI. ? ?Assessment & Plan: ?Visit Diagnoses:  ?  ICD-10-CM   ?1. Sacroiliitis (HCC)  M46.1 XR C-ARM NO REPORT  ?  ?  ?Plan: No additional findings.  ? ?Meds & Orders: No orders of the defined types were placed in this encounter. ?  ?Orders Placed This Encounter  ?Procedures  ? Sacroiliac Joint Inj  ? XR C-ARM NO REPORT  ?  ?Follow-up: Return for visit to requesting provider as needed.  ? ?Procedures: ?Sacroiliac Joint Inj on 01/11/2022 1:03 PM ?Indications: pain and diagnostic evaluation ?Details: 22 G 3.5 in needle, fluoroscopy-guided posterior approach ?Medications: 2 mL bupivacaine 0.5 %; 80 mg methylPREDNISolone acetate 80 MG/ML ?Outcome: tolerated well, no immediate complications ? ?Sacroiliac Joint Intra-Articular Injection - Posterior Approach with Fluoroscopic Guidance  ? ?Position: PRONE ? ?Additional Comments: ?Vital signs were monitored before and after the procedure. ?Patient was prepped and draped in the usual sterile fashion. ?The correct patient, procedure, and site was verified. ? ? ?Injection Procedure Details:  ? ?Location/Site:  ?Sacroiliac joint ? ?Needle size: 3.5 in Spinal Needle ? ?Needle type: Spinal ? ?Needle Placement:  Intra-articular ? ?Findings: ? -Comments: There was excellent flow of contrast producing a partial arthrogram of the sacroiliac joint.  ? ?Procedure Details: ?Starting with a 90 degree vertical and midline orientation the fluoroscope was tilted cranially 20 to 25 degrees and the target area of the inferior most part of the SI joint on the side mentioned above was visualized.  The soft tissues overlying this target were infiltrated with 4 ml. of 1% Lidocaine without Epinephrine. A #22 gauge spinal needle was inserted perpendicular to the fluoroscope table and advanced into the posterior inferior joint space using fluoroscopic guidance. ? ?Position in the joint space was confirmed by obtaining a partial arthrogram using a 2 ml. volume of Isovue-250 contrast agent. After negative aspirate for gross pus or blood, the injectate was delivered to the joint. Radiographs were obtained for documentation purposes. ? ? ?Additional Comments:  ? ?Dressing: Bandaid ?  ? ?Post-procedure details: ?Patient was observed during the procedure. ?Post-procedure instructions were reviewed. ? ?Patient left the clinic in stable condition. ? ? ? There was excellent flow of contrast producing a partial arthrogram of the sacroiliac joint.  ?Procedure, treatment alternatives, risks and benefits explained, specific risks discussed. Consent was given by the patient. Immediately prior to procedure a time out was called to verify the correct patient, procedure, equipment, support staff and site/side marked as required. Patient was prepped and draped in the usual sterile fashion.  ? ?  ?   ? ?Clinical History: ?No specialty comments available.  ? ? ? ?Objective:  VS:  HT:    WT:   BMI:     BP:  HR: bpm  TEMP: ( )  RESP:  ?Physical Exam  ? ?Imaging: ?No results found. ?

## 2022-01-19 DIAGNOSIS — Z124 Encounter for screening for malignant neoplasm of cervix: Secondary | ICD-10-CM | POA: Diagnosis not present

## 2022-01-19 DIAGNOSIS — Z3009 Encounter for other general counseling and advice on contraception: Secondary | ICD-10-CM | POA: Diagnosis not present

## 2022-01-19 DIAGNOSIS — Z01419 Encounter for gynecological examination (general) (routine) without abnormal findings: Secondary | ICD-10-CM | POA: Diagnosis not present

## 2022-01-19 DIAGNOSIS — Z6841 Body Mass Index (BMI) 40.0 and over, adult: Secondary | ICD-10-CM | POA: Diagnosis not present

## 2022-01-19 DIAGNOSIS — Z01411 Encounter for gynecological examination (general) (routine) with abnormal findings: Secondary | ICD-10-CM | POA: Diagnosis not present

## 2022-01-21 DIAGNOSIS — Z3009 Encounter for other general counseling and advice on contraception: Secondary | ICD-10-CM | POA: Diagnosis not present

## 2022-01-29 DIAGNOSIS — Z3009 Encounter for other general counseling and advice on contraception: Secondary | ICD-10-CM | POA: Diagnosis not present

## 2022-02-02 ENCOUNTER — Ambulatory Visit: Payer: BC Managed Care – PPO | Admitting: Orthopaedic Surgery

## 2022-03-08 DIAGNOSIS — I10 Essential (primary) hypertension: Secondary | ICD-10-CM | POA: Diagnosis not present

## 2022-03-08 DIAGNOSIS — N390 Urinary tract infection, site not specified: Secondary | ICD-10-CM | POA: Diagnosis not present

## 2022-03-08 DIAGNOSIS — Z6841 Body Mass Index (BMI) 40.0 and over, adult: Secondary | ICD-10-CM | POA: Diagnosis not present

## 2022-03-08 DIAGNOSIS — K219 Gastro-esophageal reflux disease without esophagitis: Secondary | ICD-10-CM | POA: Diagnosis not present

## 2022-04-15 DIAGNOSIS — K011 Impacted teeth: Secondary | ICD-10-CM | POA: Diagnosis not present

## 2022-05-31 DIAGNOSIS — I10 Essential (primary) hypertension: Secondary | ICD-10-CM | POA: Diagnosis not present

## 2022-05-31 DIAGNOSIS — Z6841 Body Mass Index (BMI) 40.0 and over, adult: Secondary | ICD-10-CM | POA: Diagnosis not present

## 2022-05-31 DIAGNOSIS — F419 Anxiety disorder, unspecified: Secondary | ICD-10-CM | POA: Diagnosis not present

## 2022-05-31 DIAGNOSIS — Z0001 Encounter for general adult medical examination with abnormal findings: Secondary | ICD-10-CM | POA: Diagnosis not present

## 2022-07-19 DIAGNOSIS — N760 Acute vaginitis: Secondary | ICD-10-CM | POA: Diagnosis not present

## 2022-09-15 DIAGNOSIS — I10 Essential (primary) hypertension: Secondary | ICD-10-CM | POA: Diagnosis not present

## 2022-09-15 DIAGNOSIS — K219 Gastro-esophageal reflux disease without esophagitis: Secondary | ICD-10-CM | POA: Diagnosis not present

## 2022-09-15 DIAGNOSIS — Z6841 Body Mass Index (BMI) 40.0 and over, adult: Secondary | ICD-10-CM | POA: Diagnosis not present

## 2022-09-15 DIAGNOSIS — M79672 Pain in left foot: Secondary | ICD-10-CM | POA: Diagnosis not present

## 2022-09-15 DIAGNOSIS — J0100 Acute maxillary sinusitis, unspecified: Secondary | ICD-10-CM | POA: Diagnosis not present

## 2022-10-04 ENCOUNTER — Ambulatory Visit (HOSPITAL_COMMUNITY)
Admission: RE | Admit: 2022-10-04 | Discharge: 2022-10-04 | Disposition: A | Discharge status: Home / Self Care | Payer: BC Managed Care – PPO | Source: Ambulatory Visit | Attending: Family Medicine | Admitting: Family Medicine

## 2022-10-04 ENCOUNTER — Other Ambulatory Visit (HOSPITAL_COMMUNITY): Payer: Self-pay | Admitting: Family Medicine

## 2022-10-04 DIAGNOSIS — S8262XA Displaced fracture of lateral malleolus of left fibula, initial encounter for closed fracture: Secondary | ICD-10-CM | POA: Diagnosis not present

## 2022-10-04 DIAGNOSIS — M722 Plantar fascial fibromatosis: Secondary | ICD-10-CM | POA: Diagnosis not present

## 2022-10-04 DIAGNOSIS — Z6841 Body Mass Index (BMI) 40.0 and over, adult: Secondary | ICD-10-CM | POA: Diagnosis not present

## 2022-10-04 DIAGNOSIS — M7732 Calcaneal spur, left foot: Secondary | ICD-10-CM | POA: Diagnosis not present

## 2022-10-04 DIAGNOSIS — M79672 Pain in left foot: Secondary | ICD-10-CM | POA: Diagnosis not present

## 2022-11-30 DIAGNOSIS — J069 Acute upper respiratory infection, unspecified: Secondary | ICD-10-CM | POA: Diagnosis not present

## 2022-11-30 DIAGNOSIS — Z6841 Body Mass Index (BMI) 40.0 and over, adult: Secondary | ICD-10-CM | POA: Diagnosis not present

## 2022-12-05 DIAGNOSIS — J1000 Influenza due to other identified influenza virus with unspecified type of pneumonia: Secondary | ICD-10-CM | POA: Diagnosis not present

## 2022-12-05 DIAGNOSIS — J40 Bronchitis, not specified as acute or chronic: Secondary | ICD-10-CM | POA: Diagnosis not present

## 2022-12-05 DIAGNOSIS — R030 Elevated blood-pressure reading, without diagnosis of hypertension: Secondary | ICD-10-CM | POA: Diagnosis not present

## 2022-12-05 DIAGNOSIS — J45901 Unspecified asthma with (acute) exacerbation: Secondary | ICD-10-CM | POA: Diagnosis not present

## 2022-12-30 ENCOUNTER — Encounter: Payer: Self-pay | Admitting: Radiology

## 2023-03-29 ENCOUNTER — Encounter (INDEPENDENT_AMBULATORY_CARE_PROVIDER_SITE_OTHER): Payer: Self-pay | Admitting: *Deleted

## 2023-03-29 DIAGNOSIS — Z6841 Body Mass Index (BMI) 40.0 and over, adult: Secondary | ICD-10-CM | POA: Diagnosis not present

## 2023-03-29 DIAGNOSIS — K2100 Gastro-esophageal reflux disease with esophagitis, without bleeding: Secondary | ICD-10-CM | POA: Diagnosis not present

## 2023-03-29 DIAGNOSIS — M722 Plantar fascial fibromatosis: Secondary | ICD-10-CM | POA: Diagnosis not present

## 2023-04-13 ENCOUNTER — Encounter (INDEPENDENT_AMBULATORY_CARE_PROVIDER_SITE_OTHER): Payer: Self-pay | Admitting: Gastroenterology

## 2023-04-13 DIAGNOSIS — K2100 Gastro-esophageal reflux disease with esophagitis, without bleeding: Secondary | ICD-10-CM | POA: Diagnosis not present

## 2023-04-13 DIAGNOSIS — R1319 Other dysphagia: Secondary | ICD-10-CM | POA: Diagnosis not present

## 2023-04-13 DIAGNOSIS — K224 Dyskinesia of esophagus: Secondary | ICD-10-CM | POA: Diagnosis not present

## 2023-04-14 ENCOUNTER — Emergency Department (HOSPITAL_COMMUNITY): Payer: BC Managed Care – PPO

## 2023-04-14 ENCOUNTER — Ambulatory Visit: Payer: BC Managed Care – PPO | Admitting: Podiatry

## 2023-04-14 ENCOUNTER — Emergency Department (HOSPITAL_COMMUNITY)
Admission: EM | Admit: 2023-04-14 | Discharge: 2023-04-14 | Disposition: A | Discharge status: Home / Self Care | Payer: BC Managed Care – PPO | Attending: Emergency Medicine | Admitting: Emergency Medicine

## 2023-04-14 ENCOUNTER — Encounter (HOSPITAL_COMMUNITY): Payer: Self-pay | Admitting: *Deleted

## 2023-04-14 DIAGNOSIS — K253 Acute gastric ulcer without hemorrhage or perforation: Secondary | ICD-10-CM | POA: Diagnosis not present

## 2023-04-14 DIAGNOSIS — R1011 Right upper quadrant pain: Secondary | ICD-10-CM | POA: Diagnosis not present

## 2023-04-14 DIAGNOSIS — R197 Diarrhea, unspecified: Secondary | ICD-10-CM | POA: Diagnosis not present

## 2023-04-14 DIAGNOSIS — R1013 Epigastric pain: Secondary | ICD-10-CM | POA: Diagnosis not present

## 2023-04-14 LAB — POC URINE PREG, ED: Preg Test, Ur: NEGATIVE

## 2023-04-14 LAB — CBC
HCT: 45.3 % (ref 36.0–46.0)
Hemoglobin: 15 g/dL (ref 12.0–15.0)
MCH: 28.4 pg (ref 26.0–34.0)
MCHC: 33.1 g/dL (ref 30.0–36.0)
MCV: 85.8 fL (ref 80.0–100.0)
Platelets: 385 10*3/uL (ref 150–400)
RBC: 5.28 MIL/uL — ABNORMAL HIGH (ref 3.87–5.11)
RDW: 13.2 % (ref 11.5–15.5)
WBC: 9.7 10*3/uL (ref 4.0–10.5)
nRBC: 0 % (ref 0.0–0.2)

## 2023-04-14 LAB — URINALYSIS, ROUTINE W REFLEX MICROSCOPIC
Bilirubin Urine: NEGATIVE
Glucose, UA: NEGATIVE mg/dL
Hgb urine dipstick: NEGATIVE
Ketones, ur: NEGATIVE mg/dL
Leukocytes,Ua: NEGATIVE
Nitrite: NEGATIVE
Protein, ur: NEGATIVE mg/dL
Specific Gravity, Urine: 1.012 (ref 1.005–1.030)
pH: 8 (ref 5.0–8.0)

## 2023-04-14 LAB — COMPREHENSIVE METABOLIC PANEL
ALT: 23 U/L (ref 0–44)
AST: 19 U/L (ref 15–41)
Albumin: 3.9 g/dL (ref 3.5–5.0)
Alkaline Phosphatase: 58 U/L (ref 38–126)
Anion gap: 6 (ref 5–15)
BUN: 7 mg/dL (ref 6–20)
CO2: 25 mmol/L (ref 22–32)
Calcium: 8.6 mg/dL — ABNORMAL LOW (ref 8.9–10.3)
Chloride: 104 mmol/L (ref 98–111)
Creatinine, Ser: 0.77 mg/dL (ref 0.44–1.00)
GFR, Estimated: >60 mL/min (ref >60)
Glucose, Bld: 100 mg/dL — ABNORMAL HIGH (ref 70–99)
Potassium: 4 mmol/L (ref 3.5–5.1)
Sodium: 135 mmol/L (ref 135–145)
Total Bilirubin: 1 mg/dL (ref 0.3–1.2)
Total Protein: 7.1 g/dL (ref 6.5–8.1)

## 2023-04-14 LAB — LIPASE, BLOOD: Lipase: 29 U/L (ref 11–51)

## 2023-04-14 LAB — POC OCCULT BLOOD, ED: Fecal Occult Bld: POSITIVE — AB

## 2023-04-14 MED ORDER — ONDANSETRON HCL 4 MG/2ML IJ SOLN
4.0000 mg | Freq: Once | INTRAMUSCULAR | Status: AC
  Administered 2023-04-14: 4 mg via INTRAVENOUS
  Filled 2023-04-14: qty 2

## 2023-04-14 MED ORDER — ONDANSETRON HCL 4 MG PO TABS: 4.0000 mg | ORAL_TABLET | Freq: Four times a day (QID) | ORAL | 0 refills | Status: DC | PRN

## 2023-04-14 MED ORDER — SODIUM CHLORIDE 0.9 % IV BOLUS
1000.0000 mL | Freq: Once | INTRAVENOUS | Status: AC
  Administered 2023-04-14: 1000 mL via INTRAVENOUS

## 2023-04-14 MED ORDER — SUCRALFATE 1 G PO TABS: 1.0000 g | ORAL_TABLET | Freq: Three times a day (TID) | ORAL | 0 refills | Status: AC

## 2023-04-14 NOTE — Discharge Instructions (Signed)
You can crush the Carafate tablets in around 4 ounces of water and drink it as a slurry, you drink it with all 3 meals and before bedtime.  Make sure that you are taking your Nexium 30 minutes before breakfast and dinner.  Stop taking naproxen or any other NSAIDs, you can use Tylenol for pain instead.  Refer to the peptic ulcer eating plan that I have provided above and follow-up with the GI doctor as you have planned at the beginning of July.

## 2023-04-14 NOTE — ED Provider Notes (Signed)
[place] EMERGENCY DEPARTMENT AT [facility] Provider Note   CSN: [id number] Arrival date & time: 04/14/23  4098     History  Chief Complaint  Patient presents with   Abdominal Pain    Cathy Wells is a 32 y.o. female with PMH of UTI, GERD, obesity who presents with concern for abd pain for 1 month, with worsening pain, nausea, vomiting, loose stool for the last week, especially over the last 4 hours with 6 episodes of emesis.  She also endorses 6-8 episodes of diarrhea in the last 24 hours.  She denies any dark stools.  She denies any history of bleeding ulcers.  She has a appointment with the GI doctor for an endoscopy on July 11.  She reports that the pain is generally worse after she eats.  She is on Nexium, she uses Maalox, Mylanta, Tums, she also has tried high-dose communion which she does report works some for her symptoms.   Abdominal Pain      Home Medications Prior to Admission medications   Medication Sig Start Date End Date Taking? Authorizing Provider  ondansetron (ZOFRAN) 4 MG tablet Take 1 tablet (4 mg total) by mouth every 6 (six) hours as needed for nausea or vomiting. 04/14/23  Yes Alee Gressman H, PA-C  sucralfate (CARAFATE) 1 g tablet Take 1 tablet (1 g total) by mouth 4 (four) times daily -  with meals and at bedtime. 04/14/23  Yes Tracey Hermance H, PA-C  albuterol (PROVENTIL HFA;VENTOLIN HFA) 108 (90 Base) MCG/ACT inhaler Inhale 1-2 puffs into the lungs every 6 (six) hours as needed for wheezing or shortness of breath.    [provider]  ALPRAZolam Prudy Feeler) 0.5 MG tablet Take 0.5 mg by mouth 2 (two) times daily as needed for anxiety. 10/31/19   [provider]  citalopram (CELEXA) 10 MG tablet Take 10 mg by mouth daily.    [provider]  diazepam (VALIUM) 5 MG tablet Take 1 by mouth 1 hour  pre-procedure with very light food. May bring 2nd tablet to appointment. 01/04/22   Tyrell Antonio, MD  lidocaine  (LIDODERM) 5 % Place 1 patch onto the skin daily. Remove & Discard patch within 12 hours or as directed by MD 09/28/21   Sabas Sous, MD  methocarbamol (ROBAXIN) 500 MG tablet Take 1 tablet (500 mg total) by mouth 4 (four) times daily. 10/13/21   Darreld Mclean, MD  naproxen (NAPROSYN) 500 MG tablet Take 1 tablet (500 mg total) by mouth 2 (two) times daily. 09/28/21   Sabas Sous, MD  olmesartan-hydrochlorothiazide (BENICAR HCT) 40-25 MG tablet Take 1 tablet by mouth daily. 10/08/19   [provider]  pantoprazole (PROTONIX) 40 MG tablet Take 40 mg by mouth daily.    [provider]  traMADol (ULTRAM) 50 MG tablet Take 1 tablet (50 mg total) by mouth every 6 (six) hours as needed. 11/03/21   Darreld Mclean, MD      Allergies    Doxycycline, Amoxapine and related, Azithromycin, Clarithromycin, Erythromycin, Penicillins, and Sulfa antibiotics    Review of Systems   Review of Systems  Gastrointestinal:  Positive for abdominal pain.  All other systems reviewed and are negative.   Physical Exam Updated Vital Signs BP (!) 129/91   Pulse 69   Temp 98.1 F (36.7 C) (Oral)   Resp 19   LMP 03/24/2023 (Exact Date)   SpO2 98%  Physical Exam Vitals and nursing note reviewed.  Constitutional:  General: She is not in acute distress.    Appearance: Normal appearance.  HENT:     Head: Normocephalic and atraumatic.  Eyes:     General:        Right eye: No discharge.        Left eye: No discharge.  Cardiovascular:     Rate and Rhythm: Normal rate and regular rhythm.     Heart sounds: No murmur heard.    No friction rub. No gallop.  Pulmonary:     Effort: Pulmonary effort is normal.     Breath sounds: Normal breath sounds.  Abdominal:     General: Bowel sounds are normal.     Palpations: Abdomen is soft.     Comments: TTP in the RUQ, as well as epigastric region. No rebound, rigidity, guarding.   Skin:    General: Skin is warm and dry.     Capillary Refill:  Capillary refill takes less than 2 seconds.  Neurological:     Mental Status: She is alert and oriented to person, place, and time.  Psychiatric:        Mood and Affect: Mood normal.        Behavior: Behavior normal.     ED Results / Procedures / Treatments   Labs (all labs ordered are listed, but only abnormal results are displayed) Labs Reviewed  COMPREHENSIVE METABOLIC PANEL - Abnormal; Notable for the following components:      Result Value   Glucose, Bld 100 (*)    Calcium 8.6 (*)    All other components within normal limits  CBC - Abnormal; Notable for the following components:   RBC 5.28 (*)    All other components within normal limits  POC OCCULT BLOOD, ED - Abnormal; Notable for the following components:   Fecal Occult Bld POSITIVE (*)    All other components within normal limits  GASTROINTESTINAL PANEL BY PCR, STOOL (REPLACES STOOL CULTURE)  LIPASE, BLOOD  URINALYSIS, ROUTINE W REFLEX MICROSCOPIC  POC URINE PREG, ED    EKG None  Radiology US Abdomen Limited RUQ (LIVER/GB)  Result Date: 04/14/2023 CLINICAL DATA:  Provided history: Right upper quadrant pain. Additional history provided by the scanning technologist: The patient reports symptoms for 1 month. Nausea/vomiting/diarrhea. EXAM: ULTRASOUND ABDOMEN LIMITED RIGHT UPPER QUADRANT COMPARISON:  Report from CT abdomen/pelvis 05/06/2018 (images currently unavailable). FINDINGS: Gallbladder: No gallstones or wall thickening visualized. No sonographic Murphy sign noted by sonographer. Common bile duct: Diameter: 3-4 mm, within normal limits. Liver: Limited assessment of the hepatic parenchyma due to shadowing from overlying bowel gas. Within this limitation, no focal hepatic lesion is identified. Increased hepatic parenchymal echogenicity. Portal vein is patent on color Doppler imaging with normal direction of blood flow towards the liver. IMPRESSION: 1. Assessment of the hepatic parenchyma is limited due to shadowing from  overlying bowel gas. 2. Hyperechogenicity of the hepatic parenchyma. This is a nonspecific finding, which may be seen in the setting of hepatic steatosis or other chronic hepatic parenchymal disease. 3. Otherwise unremarkable right upper quadrant ultrasound, as described. Electronically Signed   By: Jackey Loge D.O.   On: 04/14/2023 10:03    Procedures Procedures    Medications Ordered in ED Medications  ondansetron (ZOFRAN) injection 4 mg (4 mg Intravenous Given 04/14/23 1041)  sodium chloride 0.9 % bolus 1,000 mL (0 mLs Intravenous Stopped 04/14/23 1130)    ED Course/ Medical Decision Making/ A&P Clinical Course as of 04/14/23 1131  Thu Apr 14, 2023  1049  30 mins prior to breakfast and dinner , no NSAIDs, alcohol  Carafate suspension 1gm 4 meals and bedtime Vs breaking up tablet in water [CP]    Clinical Course User Index [CP] Olene Floss, PA-C                             Medical Decision Making Amount and/or Complexity of Data Reviewed Labs: ordered. Radiology: ordered.  Risk Prescription drug management.   This patient is a 31 y.o. female  who presents to the ED for concern of epigastric abdominal pain, nausea, vomiting, diarrhea.   Differential diagnoses prior to evaluation: The emergent differential diagnosis includes, but is not limited to,  esophagitis, gastritis, peptic ulcer disease, esophageal rupture, gastric rupture, Boerhaave's, Mallory-Weiss, pancreatitis, cholecystitis, cholangitis, acute mesenteric ischemia, atypical chest pain or ACS, lower lobar pneumonia versus other . This is not an exhaustive differential.   Past Medical History / Co-morbidities / Social History: UTI, GERD, obesity  Additional history: Chart reviewed. Pertinent results include: Overall noncontributory, no GI visits on file  Physical Exam: Physical exam performed. The pertinent findings include: TTP in the RUQ, as well as epigastric region. No rebound, rigidity, guarding.     Lab Tests/Imaging studies: I personally interpreted labs/imaging and the pertinent results include: CMP unremarkable, CBC unremarkable, notably with normal hemoglobin unremarkable, pregnancy test negative lipase normal.  GI pathogen panel in process, but patient checked results on her portal as it would not alter my care at this time.  May help her planning in the future..  I independently interpreted right upper quadrant ultrasound which shows no evidence of acute gallbladder pathology or other surgical emergency at this time.  I agree with the radiologist interpretation.  Consults: Dr. Jena Gauss of GI who recommended adding on Carafate, discontinuing NSAIDs, and making sure that she is taking her Nexium appropriately, felt that her July 11 appointment was appropriate given her presentation in the emergency department today.  Medications: I ordered medication including fluid bolus, Zofran for nausea, dehydration, patient appears improved, and is tolerating p.o. at time of discharge..  I have reviewed the patients home medicines and have made adjustments as needed.   Disposition: After consideration of the diagnostic results and the patients response to treatment, I feel that patient is stable for discharge with plan as.   emergency department workup does not suggest an emergent condition requiring admission or immediate intervention beyond what has been performed at this time. The plan is: as above. The patient is safe for discharge and has been instructed to return immediately for worsening symptoms, change in symptoms or any other concerns.  Final Clinical Impression(s) / ED Diagnoses Final diagnoses:  Epigastric abdominal pain  Acute gastric ulcer, unspecified whether gastric ulcer hemorrhage or perforation present    Rx / DC Orders ED Discharge Orders          Ordered    sucralfate (CARAFATE) 1 g tablet  3 times daily with meals & bedtime        04/14/23 1113    ondansetron (ZOFRAN) 4  MG tablet  Every 6 hours PRN        04/14/23 1113              Imer Foxworth, [place] H, PA-C 04/14/23 1131    Pricilla Loveless, MD 04/19/23 [phone]

## 2023-04-14 NOTE — ED Notes (Signed)
Pt states, "I have a stool sample that I had collected at home because I was taking it to my doctor. It is on ice. Can you use that to do the test?" This Rn called and spoke with the lab regarding the pts request

## 2023-04-14 NOTE — ED Triage Notes (Signed)
Pt in c/o abd pain onset x , seen pcp last week with scheduled endoscopy in July, c/o n/v/d, x6 emesis in the last 24 hours, pt c/o diarrhea x 6-8 times in the last 24 hours, A&O x4

## 2023-04-15 LAB — GASTROINTESTINAL PANEL BY PCR, STOOL (REPLACES STOOL CULTURE)

## 2023-04-18 ENCOUNTER — Ambulatory Visit: Payer: BC Managed Care – PPO | Admitting: Gastroenterology

## 2023-04-18 ENCOUNTER — Encounter: Payer: Self-pay | Admitting: Gastroenterology

## 2023-04-18 ENCOUNTER — Encounter: Payer: Self-pay | Admitting: *Deleted

## 2023-04-18 VITALS — BP 127/91 | HR 86 | Temp 97.5°F | Ht 62.0 in | Wt 240.2 lb

## 2023-04-18 DIAGNOSIS — R197 Diarrhea, unspecified: Secondary | ICD-10-CM

## 2023-04-18 DIAGNOSIS — R6881 Early satiety: Secondary | ICD-10-CM

## 2023-04-18 DIAGNOSIS — K219 Gastro-esophageal reflux disease without esophagitis: Secondary | ICD-10-CM

## 2023-04-18 DIAGNOSIS — R1310 Dysphagia, unspecified: Secondary | ICD-10-CM

## 2023-04-18 DIAGNOSIS — R195 Other fecal abnormalities: Secondary | ICD-10-CM

## 2023-04-18 DIAGNOSIS — R1013 Epigastric pain: Secondary | ICD-10-CM | POA: Diagnosis not present

## 2023-04-18 MED ORDER — ESOMEPRAZOLE MAGNESIUM 40 MG PO CPDR: 40.0000 mg | DELAYED_RELEASE_CAPSULE | Freq: Every day | ORAL | 2 refills | Status: DC

## 2023-04-18 NOTE — Patient Instructions (Addendum)
I want you to take Nexium 40 mg once daily, 30 minutes prior to breakfast or immediately in the morning on empty stomach.  Follow a GERD diet:  Avoid fried, fatty, greasy, spicy, citrus foods. Avoid caffeine and carbonated beverages. Avoid chocolate. Try eating 4-6 small meals a day rather than 3 large meals. Do not eat within 3 hours of laying down. Prop head of bed up on wood or bricks to create a 6 inch incline.  Avoid any NSAIDs (Aleve, Advil, ibuprofen, Motrin, meloxicam, Excedrin) or aspirin powders including BC or Goody powders.  Follow a bland diet.  This should help you with your stomach as well as your diarrhea.  Continue taking the Carafate and mix in water and drink it as a slurry 3 times a day before meals and before bedtime, regardless if you eat or not.  Continue the Zofran as needed, if you began to have vomiting that you are unable to stop you may take 2 Zofran tablets at 1 time since she did not feel good relief with 1.  You may use Imodium as needed for diarrhea, especially if you are having more than 4-5 bowel movements daily.  I will start with a half a tablet once daily and increase as needed.   It was a pleasure to see you today. I want to create trusting relationships with patients. If you receive a survey regarding your visit,  I greatly appreciate you taking time to fill this out on paper or through your MyChart. I value your feedback.  Brooke Bonito, MSN, FNP-BC, AGACNP-BC [facility]

## 2023-04-18 NOTE — H&P (View-Only) (Signed)
GI Office Note    Referring Provider: Assunta Found, MD Primary Care Physician:  Assunta Found, MD  Primary Gastroenterologist: Hennie Duos. Marletta Lor, DO  Chief Complaint   Chief Complaint  Patient presents with   New Patient (Initial Visit)    ED follow up, GERD, ulcer, dysphagia, etc   History of Present Illness   Cathy Wells is a 32 y.o. female presenting today at the request of Assunta Found, MD for GERD and dysphagia.  Recent ED visit at [facility] 04/14/2023 with 1 month of epigastric pain, nausea, vomiting, and loose stool for 1 week.  At the time of her ED visit she presented with 4 hours of intractable vomiting as well as 6/8 episodes of diarrhea in the prior 24 hours.  Patient reported she was on Nexium and was using Maalox, Mylanta, and Tums.  Also tried high-dose communion which was improving some of her symptoms.  She denied any melena.  Noted to have tenderness to right upper quadrant as well as epigastric region on exam.  Stool Hemoccult positive.  Labs unremarkable other than mildly elevated glucose and low calcium.  She was advised Carafate 1 g 4 times daily and was given fluids and Zofran in the ED.  Dr. Jena Gauss advised Carafate and discontinuing NSAIDs ensure she is taking Nexium appropriately and to follow-up in the office.  Today: Last few months she has had early satiety and trouble swallowing after taking a few bites of food and needed to regurgitate. Food feels stuck at the bottom of the esophagus. Even liquids feel stuck. Also having burning and pain in the epigastric region. Last week she was very nauseas and was vomiting. This morning she is nauseas but no vomiting today. Does have a lot of burping. No hematemesis. Has had some melena but nothing bright red. Just recently stopped taking. Denies spicy foods but does eat fried foods.   Denies pepto or mylanta.   No tobacco use, drug use. No frequent alcohol use. Has plantar fascitis and was on meloxicam for 3 months and  stopped it about 2 weeks ago.   Has been taking the carafate but has not been taking it 4 times daily, only eats twice daily and so she has only been taking it twice daily.   No family history of gastric ulcers.   Was doing pantoprazole twice daily and not having much relief. Switched to Nexium 20 mg once daily and that does not seemed to be working well. Wakes up in the middle of the night. Does not sleep with head elevated. 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 Symptoms are worse in the mornings and in the evenings. Not eating late at night any longer.   Has diarrhea all the time - never has more formed/solid stool for the last 6 months. States bristol 6/7 in nature. Goes about 4 times in the mornings and 3 times throughout the day.  No lower abdominal pain. Does not feel like zofran is working.   Tried levsin but was told by ER to stop.  No peripheral swelling.    Current Outpatient Medications  Medication Sig Dispense Refill   albuterol (PROVENTIL HFA;VENTOLIN HFA) 108 (90 Base) MCG/ACT inhaler Inhale 1-2 puffs into the lungs every 6 (six) hours as needed for wheezing or shortness of breath.     ALPRAZolam (XANAX) 0.5 MG tablet Take 0.5 mg by mouth 2 (two) times daily as needed for anxiety.     chlorpheniramine-HYDROcodone (TUSSIONEX) 10-8 MG/5ML  SMARTSIG:5 Milliliter(s) By Mouth Every 12 Hours PRN     citalopram (CELEXA) 10 MG tablet Take 10 mg by mouth daily.     esomeprazole (NEXIUM) 40 MG capsule Take 1 capsule (40 mg total) by mouth daily at 12 noon. 30 capsule 2   hyoscyamine (LEVSIN SL) 0.125 MG SL tablet Place under the tongue 4 (four) times daily as needed.     olmesartan-hydrochlorothiazide (BENICAR HCT) 40-25 MG tablet Take 1 tablet by mouth daily.     ondansetron (ZOFRAN) 4 MG tablet Take 1 tablet (4 mg total) by mouth every 6 (six) hours as needed for nausea or vomiting. 20 tablet 0   sucralfate (CARAFATE) 1 g tablet Take 1 tablet (1 g total) by mouth 4 (four)  times daily -  with meals and at bedtime. 120 tablet 0   No current facility-administered medications for this visit.    Past Medical History:  Diagnosis Date   Complication of anesthesia    PONV   Frequency of urination    GERD (gastroesophageal reflux disease)    Hematuria    Left ureteral [place]    Mild asthma    Ovarian cyst    Urgency of urination    Wears contact lenses     Past Surgical History:  Procedure Laterality Date   APPENDECTOMY  07/25/2006   OPEN   URETEROSCOPY WITH HOLMIUM LASER LITHOTRIPSY Left 07/21/2017   Procedure: retrogrades, URETEROSCOPY WITH [place]is basketry, stent placement;  Surgeon: Crist Fat, MD;  Location: [facility];  Service: Urology;  Laterality: Left;    Family History  Problem Relation Age of Onset   Hypertension Mother    Asthma Father    Hypertension Father    Hypertension Maternal Grandfather    Diabetes Paternal Grandmother    Hypertension Paternal Grandmother    COPD Paternal Grandfather     Allergies as of 04/18/2023 - Review Complete 04/18/2023  Allergen Reaction Noted   Doxycycline Nausea And Vomiting 10/19/2011   Amoxapine and related Hives and Nausea And Vomiting 10/19/2011   Azithromycin Other (See Comments) 10/19/2011   Clarithromycin Nausea And Vomiting 10/19/2011   Erythromycin Other (See Comments) 07/20/2017   Penicillins Hives 10/19/2011   Sulfa antibiotics Hives 10/19/2011    Social History   Socioeconomic History   Marital status: Married    Spouse name: Not on file   Number of children: Not on file   Years of education: Not on file   Highest education level: Not on file  Occupational History   Not on file  Tobacco Use   Smoking status: Never   Smokeless tobacco: Never  Vaping Use   Vaping Use: Never used  Substance and Sexual Activity   Alcohol use: No   Drug use: Yes    Types: Marijuana   Sexual activity: Yes    Birth control/protection: None  Other Topics Concern   Not  on file  Social History Narrative   Not on file   Social Determinants of Health   Financial Resource Strain: Not on file  Food Insecurity: Not on file  Transportation Needs: Not on file  Physical Activity: Not on file  Stress: Not on file  Social Connections: Not on file  Intimate Partner Violence: Not on file   Review of Systems   Gen: Denies any fever, chills, fatigue, weight loss, lack of appetite.  CV: Denies chest pain, heart palpitations, peripheral edema, syncope.  Resp: Denies shortness of breath at rest or with exertion. Denies  wheezing or cough.  GI: see HPI GU : Denies urinary burning, urinary frequency, urinary hesitancy MS: Denies joint pain, muscle weakness, cramps, or limitation of movement.  Derm: Denies rash, itching, dry skin Psych: Denies depression, anxiety, memory loss, and confusion Heme: Denies bruising, bleeding, and enlarged lymph nodes.  Physical Exam   BP (!) 127/91   Pulse 86   Temp (!) 97.5 F (36.4 C)   Ht 5\' 2"  (1.575 m)   Wt 240 lb 3.2 oz (109 kg)   LMP 03/24/2023 (Exact Date)   BMI 43.93 kg/m   General:   Alert and oriented. Pleasant and cooperative. Well-nourished and well-developed.  Head:  Normocephalic and atraumatic. Eyes:  Without icterus, sclera clear and conjunctiva pink.  Ears:  Normal auditory acuity. Mouth:  No deformity or lesions, oral mucosa pink.  Lungs:  Clear to auscultation bilaterally. No wheezes, rales, or rhonchi. No distress.  Heart:  S1, S2 present without murmurs appreciated.  Abdomen:  +BS, soft, non-distended. TTP to epigastrium and LUQ. No HSM noted. No guarding or rebound. No masses appreciated.  Rectal:  Deferred  Msk:  Symmetrical without gross deformities. Normal posture. Extremities:  Without edema. Neurologic:  Alert and  oriented x4;  grossly normal neurologically. Skin:  Intact without significant lesions or rashes. Psych:  Alert and cooperative. Normal mood and affect.  Assessment   Cathy Wells is a 32 y.o. female with a history of GERD and obesity presenting today for evaluation of upper abdominal pain, dysphagia, nausea/vomiting.  Epigastric pain, N/V, dysphagia, heme positive stool: Has been  having epigastric pain, early satiety, dysphagia and intermittent N/V for about 1 month. Previous history of frequent NSAID use, stopped 2 weeks ago. Has been experiencing some melena but no brbpr. Stool heme occult positive in the ED. Suspect symptoms secondary to esophagitis/gastritis or possible peptic ulcer in the setting of chronic NSAID use. Will schedule EGD for further evaluation. Will increase nexium from 20mg  once daily to mg once daily and submit for PA given previous insurance denial. Has failed pantoprazole BID. Currently taking carafate but not 4 times daily given only eats 2 meals. Encouraged to take 4 times daily even if not eating. Advised Zofran as needed and may take 8mg  if intractable vomiting occurs. Follow bland diet.  Diarrhea: Symptoms for the last 6 months. Recent stool studies negative for infectious process. [place] 6/7 stools. On average can have 5-7 loose stools. Denies lower abdominal pain. Was given Zofran by PCP but told by the ER not to take. Symptoms have not also been the frequent, has increased since upper abdominal symptoms began. If symptoms continue after EGD and current treatment then will pursue further workup of diarrhea including TSH, CRP, celiac labs, alpha gal, etc. Advised she may use 1/2 imodium as needed now and hold in the setting of diarrhea.   PLAN   Nexium 40 mg once daily, 30 minutes before breakfast. .  Continue Carafate 1g QID whether you eat or not. Proceed with upper endoscopy with possible dilation with propofol by Dr. Marletta Lor in near future: the risks, benefits, and alternatives have been discussed with the patient in detail. The patient states understanding and desires to proceed. ASA 3 (BMI) UPT BMP Continue Zofran as needed.  Imodium  as needed.  Start with half tablet once daily. Provided education on diarrhea and peptic ulcer disease. Follow up in 8-10 weeks.    Brooke Bonito, MSN, FNP-BC, AGACNP-BC [facility]

## 2023-04-18 NOTE — Progress Notes (Signed)
GI Office Note    Referring Provider: Assunta Found, MD Primary Care Physician:  Assunta Found, MD  Primary Gastroenterologist: Hennie Duos. Marletta Lor, DO  Chief Complaint   Chief Complaint  Patient presents with   New Patient (Initial Visit)    ED follow up, GERD, ulcer, dysphagia, etc   History of Present Illness   Cathy Wells is a 32 y.o. female presenting today at the request of Assunta Found, MD for GERD and dysphagia.  Recent ED visit at [facility] 04/14/2023 with 1 month of epigastric pain, nausea, vomiting, and loose stool for 1 week.  At the time of her ED visit she presented with 4 hours of intractable vomiting as well as 6/8 episodes of diarrhea in the prior 24 hours.  Patient reported she was on Nexium and was using Maalox, Mylanta, and Tums.  Also tried high-dose communion which was improving some of her symptoms.  She denied any melena.  Noted to have tenderness to right upper quadrant as well as epigastric region on exam.  Stool Hemoccult positive.  Labs unremarkable other than mildly elevated glucose and low calcium.  She was advised Carafate 1 g 4 times daily and was given fluids and Zofran in the ED.  Dr. Jena Gauss advised Carafate and discontinuing NSAIDs ensure she is taking Nexium appropriately and to follow-up in the office.  Today: Last few months she has had early satiety and trouble swallowing after taking a few bites of food and needed to regurgitate. Food feels stuck at the bottom of the esophagus. Even liquids feel stuck. Also having burning and pain in the epigastric region. Last week she was very nauseas and was vomiting. This morning she is nauseas but no vomiting today. Does have a lot of burping. No hematemesis. Has had some melena but nothing bright red. Just recently stopped taking. Denies spicy foods but does eat fried foods.   Denies pepto or mylanta.   No tobacco use, drug use. No frequent alcohol use. Has plantar fascitis and was on meloxicam for 3 months and  stopped it about 2 weeks ago.   Has been taking the carafate but has not been taking it 4 times daily, only eats twice daily and so she has only been taking it twice daily.   No family history of gastric ulcers.   Was doing pantoprazole twice daily and not having much relief. Switched to Nexium 20 mg once daily and that does not seemed to be working well. Wakes up in the middle of the night. Does not sleep with head elevated. 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 Symptoms are worse in the mornings and in the evenings. Not eating late at night any longer.   Has diarrhea all the time - never has more formed/solid stool for the last 6 months. States brist[place] 6/7 in nature. Goes about 4 times in the mornings and 3 times throughout the day.  No lower abdominal pain. Does not feel like zofran is working.   Tried levsin but was told by ER to stop.  No peripheral swelling.    Current Outpatient Medications  Medication Sig Dispense Refill   albuterol (PROVENTIL HFA;VENTOLIN HFA) 108 (90 Base) MCG/ACT inhaler Inhale 1-2 puffs into the lungs every 6 (six) hours as needed for wheezing or shortness of breath.     ALPRAZolam (XANAX) 0.5 MG tablet Take 0.5 mg by mouth 2 (two) times daily as needed for anxiety.     chlorpheniramine-HYDROcodone (TUSSIONEX) 10-8 MG/5ML  SMARTSIG:5 Milliliter(s) By Mouth Every 12 Hours PRN     citalopram (CELEXA) 10 MG tablet Take 10 mg by mouth daily.     esomeprazole (NEXIUM) 40 MG capsule Take 1 capsule (40 mg total) by mouth daily at 12 noon. 30 capsule 2   hyoscyamine (LEVSIN SL) 0.125 MG SL tablet Place under the tongue 4 (four) times daily as needed.     olmesartan-hydrochlorothiazide (BENICAR HCT) 40-25 MG tablet Take 1 tablet by mouth daily.     ondansetron (ZOFRAN) 4 MG tablet Take 1 tablet (4 mg total) by mouth every 6 (six) hours as needed for nausea or vomiting. 20 tablet 0   sucralfate (CARAFATE) 1 g tablet Take 1 tablet (1 g total) by mouth 4 (four)  times daily -  with meals and at bedtime. 120 tablet 0   No current facility-administered medications for this visit.    Past Medical History:  Diagnosis Date   Complication of anesthesia    PONV   Frequency of urination    GERD (gastroesophageal reflux disease)    Hematuria    Left ureteral [place]    Mild asthma    Ovarian cyst    Urgency of urination    Wears contact lenses     Past Surgical History:  Procedure Laterality Date   APPENDECTOMY  07/25/2006   OPEN   URETEROSCOPY WITH HOLMIUM LASER LITHOTRIPSY Left 07/21/2017   Procedure: retrogrades, URETEROSCOPY WITH Addis basketry, stent placement;  Surgeon: Crist Fat, MD;  Location: [facility];  Service: Urology;  Laterality: Left;    Family History  Problem Relation Age of Onset   Hypertension Mother    Asthma Father    Hypertension Father    Hypertension Maternal Grandfather    Diabetes Paternal Grandmother    Hypertension Paternal Grandmother    COPD Paternal Grandfather     Allergies as of 04/18/2023 - Review Complete 04/18/2023  Allergen Reaction Noted   Doxycycline Nausea And Vomiting 10/19/2011   Amoxapine and related Hives and Nausea And Vomiting 10/19/2011   Azithromycin Other (See Comments) 10/19/2011   Clarithromycin Nausea And Vomiting 10/19/2011   Erythromycin Other (See Comments) 07/20/2017   Penicillins Hives 10/19/2011   Sulfa antibiotics Hives 10/19/2011    Social History   Socioeconomic History   Marital status: Married    Spouse name: Not on file   Number of children: Not on file   Years of education: Not on file   Highest education level: Not on file  Occupational History   Not on file  Tobacco Use   Smoking status: Never   Smokeless tobacco: Never  Vaping Use   Vaping Use: Never used  Substance and Sexual Activity   Alcohol use: No   Drug use: Yes    Types: Marijuana   Sexual activity: Yes    Birth control/protection: None  Other Topics Concern   Not  on file  Social History Narrative   Not on file   Social Determinants of Health   Financial Resource Strain: Not on file  Food Insecurity: Not on file  Transportation Needs: Not on file  Physical Activity: Not on file  Stress: Not on file  Social Connections: Not on file  Intimate Partner Violence: Not on file   Review of Systems   Gen: Denies any fever, chills, fatigue, weight loss, lack of appetite.  CV: Denies chest pain, heart palpitations, peripheral edema, syncope.  Resp: Denies shortness of breath at rest or with exertion. Denies  wheezing or cough.  GI: see HPI GU : Denies urinary burning, urinary frequency, urinary hesitancy MS: Denies joint pain, muscle weakness, cramps, or limitation of movement.  Derm: Denies rash, itching, dry skin Psych: Denies depression, anxiety, memory loss, and confusion Heme: Denies bruising, bleeding, and enlarged lymph nodes.  Physical Exam   BP (!) 127/91   Pulse 86   Temp (!) 97.5 F (36.4 C)   Ht 5\' 2"  (1.575 m)   Wt 240 lb 3.2 oz (109 kg)   LMP 03/24/2023 (Exact Date)   BMI 43.93 kg/m   General:   Alert and oriented. Pleasant and cooperative. Well-nourished and well-developed.  Head:  Normocephalic and atraumatic. Eyes:  Without icterus, sclera clear and conjunctiva pink.  Ears:  Normal auditory acuity. Mouth:  No deformity or lesions, oral mucosa pink.  Lungs:  Clear to auscultation bilaterally. No wheezes, rales, or rhonchi. No distress.  Heart:  S1, S2 present without murmurs appreciated.  Abdomen:  +BS, soft, non-distended. TTP to epigastrium and LUQ. No HSM noted. No guarding or rebound. No masses appreciated.  Rectal:  Deferred  Msk:  Symmetrical without gross deformities. Normal posture. Extremities:  Without edema. Neurologic:  Alert and  oriented x4;  grossly normal neurologically. Skin:  Intact without significant lesions or rashes. Psych:  Alert and cooperative. Normal mood and affect.  Assessment   WAVIE KADISH is a 32 y.o. female with a history of GERD and obesity presenting today for evaluation of upper abdominal pain, dysphagia, nausea/vomiting.  Epigastric pain, N/V, dysphagia, heme positive stool: Has been  having epigastric pain, early satiety, dysphagia and intermittent N/V for about 1 month. Previous history of frequent NSAID use, stopped 2 weeks ago. Has been experiencing some melena but no brbpr. Stool heme occult positive in the ED. Suspect symptoms secondary to esophagitis/gastritis or possible peptic ulcer in the setting of chronic NSAID use. Will schedule EGD for further evaluation. Will increase nexium from 20mg  once daily to mg once daily and submit for PA given previous insurance denial. Has failed pantoprazole BID. Currently taking carafate but not 4 times daily given only eats 2 meals. Encouraged to take 4 times daily even if not eating. Advised Zofran as needed and may take 8mg  if intractable vomiting occurs. Follow bland diet.  Diarrhea: Symptoms for the last 6 months. Recent stool studies negative for infectious process. [place] 6/7 stools. On average can have 5-7 loose stools. Denies lower abdominal pain. Was given Zofran by PCP but told by the ER not to take. Symptoms have not also been the frequent, has increased since upper abdominal symptoms began. If symptoms continue after EGD and current treatment then will pursue further workup of diarrhea including TSH, CRP, celiac labs, alpha gal, etc. Advised she may use 1/2 imodium as needed now and hold in the setting of diarrhea.   PLAN   Nexium 40 mg once daily, 30 minutes before breakfast. .  Continue Carafate 1g QID whether you eat or not. Proceed with upper endoscopy with possible dilation with propofol by Dr. Marletta Lor in near future: the risks, benefits, and alternatives have been discussed with the patient in detail. The patient states understanding and desires to proceed. ASA 3 (BMI) UPT BMP Continue Zofran as needed.  Imodium  as needed.  Start with half tablet once daily. Provided education on diarrhea and peptic ulcer disease. Follow up in 8-10 weeks.    Brooke Bonito, MSN, FNP-BC, AGACNP-BC [facility]

## 2023-04-19 ENCOUNTER — Telehealth: Payer: Self-pay | Admitting: *Deleted

## 2023-04-19 ENCOUNTER — Encounter: Payer: Self-pay | Admitting: *Deleted

## 2023-04-19 ENCOUNTER — Other Ambulatory Visit: Payer: Self-pay | Admitting: Gastroenterology

## 2023-04-19 MED ORDER — OMEPRAZOLE 40 MG PO CPDR: 40.0000 mg | DELAYED_RELEASE_CAPSULE | Freq: Two times a day (BID) | ORAL | 3 refills | Status: AC

## 2023-04-19 NOTE — Telephone Encounter (Signed)
Received denial letter for esomeprazole. Pt needs to try and fail omeprazole, generic prilosec and pantoprazole.

## 2023-04-19 NOTE — Telephone Encounter (Signed)
Noted. Sent her a MyChart message.

## 2023-04-21 ENCOUNTER — Encounter: Payer: Self-pay | Admitting: *Deleted

## 2023-04-22 ENCOUNTER — Ambulatory Visit: Payer: BC Managed Care – PPO | Admitting: Podiatry

## 2023-04-25 ENCOUNTER — Telehealth: Payer: Self-pay | Admitting: *Deleted

## 2023-04-25 NOTE — Telephone Encounter (Signed)
LMTRC   Need to reschedule procedure on 05/02/23 due to provider being out.

## 2023-04-26 ENCOUNTER — Encounter: Payer: Self-pay | Admitting: Gastroenterology

## 2023-04-26 ENCOUNTER — Encounter: Payer: Self-pay | Admitting: *Deleted

## 2023-04-26 NOTE — Patient Instructions (Signed)
20    Your procedure is scheduled on: 05/02/2023  Report to [facility] Main Entrance at 11:45    AM.  Call this number if you have problems the morning of surgery: [phone]   Remember:   Follow instructions on letter from office regarding when to stop eating and drinking        No Smoking the day of procedure      Take these medicines the morning of surgery with A SIP OF WATER: Celexa and omeprazole   Do not wear jewelry, make-up or nail polish.  Do not wear lotions, powders, or perfumes. You may wear deodorant.                Do not bring valuables to the hospital.  Contacts, dentures or bridgework may not be worn into surgery.  Leave suitcase in the car. After surgery it may be brought to your room.  For patients admitted to the hospital, checkout time is 11:00 AM the day of discharge.   Patients discharged the day of surgery will not be allowed to drive home. Upper Endoscopy, Adult Upper endoscopy is a procedure to look inside the upper GI (gastrointestinal) tract. The upper GI tract is made up of: The part of the body that moves food from your mouth to your stomach (esophagus). The stomach. The first part of your small intestine (duodenum). This procedure is also called esophagogastroduodenoscopy (EGD) or gastroscopy. In this procedure, your health care provider passes a thin, flexible tube (endoscope) through your mouth and down your esophagus into your stomach. A small camera is attached to the end of the tube. Images from the camera appear on a monitor in the exam room. During this procedure, your health care provider may also remove a small piece of tissue to be sent to a lab and examined under a microscope (biopsy). Your health care provider may do an upper endoscopy to diagnose cancers of the upper GI tract. You may also have this procedure to find the cause of other conditions, such as: Stomach pain. Heartburn. Pain or problems when swallowing. Nausea and vomiting. Stomach  bleeding. Stomach ulcers. Tell a health care provider about: Any allergies you have. All medicines you are taking, including vitamins, herbs, eye drops, creams, and over-the-counter medicines. Any problems you or family members have had with anesthetic medicines. Any blood disorders you have. Any surgeries you have had. Any medical conditions you have. Whether you are pregnant or may be pregnant. What are the risks? Generally, this is a safe procedure. However, problems may occur, including: Infection. Bleeding. Allergic reactions to medicines. A tear or hole (perforation) in the esophagus, stomach, or duodenum. What happens before the procedure? Staying hydrated Follow instructions from your health care provider about hydration, which may include: Up to 4 hours before the procedure - you may continue to drink clear liquids, such as water, clear fruit juice, black coffee, and plain tea.   Medicines Ask your health care provider about: Changing or stopping your regular medicines. This is especially important if you are taking diabetes medicines or blood thinners. Taking medicines such as aspirin and ibuprofen. These medicines can thin your blood. Do not take these medicines unless your health care provider tells you to take them. Taking over-the-counter medicines, vitamins, herbs, and supplements. General instructions Plan to have someone take you home from the hospital or clinic. If you will be going home right after the procedure, plan to have someone with you for 24 hours. Ask your  health care provider what steps will be taken to help prevent infection. What happens during the procedure?  An IV will be inserted into one of your veins. You may be given one or more of the following: A medicine to help you relax (sedative). A medicine to numb the throat (local anesthetic). You will lie on your left side on an exam table. Your health care provider will pass the endoscope through  your mouth and down your esophagus. Your health care provider will use the scope to check the inside of your esophagus, stomach, and duodenum. Biopsies may be taken. The endoscope will be removed. The procedure may vary among health care providers and hospitals. What happens after the procedure? Your blood pressure, heart rate, breathing rate, and blood oxygen level will be monitored until you leave the hospital or clinic. Do not drive for 24 hours if you were given a sedative during your procedure. When your throat is no longer numb, you may be given some fluids to drink. It is up to you to get the results of your procedure. Ask your health care provider, or the department that is doing the procedure, when your results will be ready. Summary Upper endoscopy is a procedure to look inside the upper GI tract. During the procedure, an IV will be inserted into one of your veins. You may be given a medicine to help you relax. A medicine will be used to numb your throat. The endoscope will be passed through your mouth and down your esophagus. This information is not intended to replace advice given to you by your health care provider. Make sure you discuss any questions you have with your health care provider. Document Revised: 04/12/2018 Document Reviewed: 03/20/2018 Elsevier Patient Education  2020 Elsevier Inc.                                                                                                                                      EndoscopyCare After  Please read the instructions outlined below and refer to this sheet in the next few weeks. These discharge instructions provide you with general information on caring for yourself after you leave the hospital. Your doctor may also give you specific instructions. While your treatment has been planned according to the most current medical practices available, unavoidable complications occasionally occur. If you have any problems or  questions after discharge, please call your doctor. HOME CARE INSTRUCTIONS Activity You may resume your regular activity but move at a slower pace for the next 24 hours.  Take frequent rest periods for the next 24 hours.  Walking will help expel (get rid of) the air and reduce the bloated feeling in your abdomen.  No driving for 24 hours (because of the anesthesia (medicine) used during the test).  You may shower.  Do not sign any important legal documents or operate any machinery for 24 hours (because of the anesthesia used  during the test).  Nutrition Drink plenty of fluids.  You may resume your normal diet.  Begin with a light meal and progress to your normal diet.  Avoid alcoholic beverages for 24 hours or as instructed by your caregiver.  Medications You may resume your normal medications unless your caregiver tells you otherwise. What you can expect today You may experience abdominal discomfort such as a feeling of fullness or "gas" pains.  You may experience a sore throat for 2 to 3 days. This is normal. Gargling with salt water may help this.  Follow-up Your doctor will discuss the results of your test with you. SEEK IMMEDIATE MEDICAL CARE IF: You have excessive nausea (feeling sick to your stomach) and/or vomiting.  You have severe abdominal pain and distention (swelling).  You have trouble swallowing.  You have a temperature over 100 F (37.8 C).  You have rectal bleeding or vomiting of blood.  Document Released: 06/01/2004 Document Revised: 10/07/2011 Document Reviewed: 12/13/2007 Esophageal Dilatation Esophageal dilatation, also called esophageal dilation, is a procedure to widen or open a blocked or narrowed part of the esophagus. The esophagus is the part of the body that moves food and liquid from the mouth to the stomach. You may need this procedure if: You have a buildup of scar tissue in your esophagus that makes it difficult, painful, or impossible to swallow. This  can be caused by gastroesophageal reflux disease (GERD). You have cancer of the esophagus. There is a problem with how food moves through your esophagus. In some cases, you may need this procedure repeated at a later time to dilate the esophagus gradually. Tell a health care provider about: Any allergies you have. All medicines you are taking, including vitamins, herbs, eye drops, creams, and over-the-counter medicines. Any problems you or family members have had with anesthetic medicines. Any blood disorders you have. Any surgeries you have had. Any medical conditions you have. Any antibiotic medicines you are required to take before dental procedures. Whether you are pregnant or may be pregnant. What are the risks? Generally, this is a safe procedure. However, problems may occur, including: Bleeding due to a tear in the lining of the esophagus. A hole, or perforation, in the esophagus. What happens before the procedure? Ask your health care provider about: Changing or stopping your regular medicines. This is especially important if you are taking diabetes medicines or blood thinners. Taking medicines such as aspirin and ibuprofen. These medicines can thin your blood. Do not take these medicines unless your health care provider tells you to take them. Taking over-the-counter medicines, vitamins, herbs, and supplements. Follow instructions from your health care provider about eating or drinking restrictions. Plan to have a responsible adult take you home from the hospital or clinic. Plan to have a responsible adult care for you for the time you are told after you leave the hospital or clinic. This is important. What happens during the procedure? You may be given a medicine to help you relax (sedative). A numbing medicine may be sprayed into the back of your throat, or you may gargle the medicine. Your health care provider may perform the dilatation using various surgical instruments, such  as: Simple dilators. This instrument is carefully placed in the esophagus to stretch it. Guided wire bougies. This involves using an endoscope to insert a wire into the esophagus. A dilator is passed over this wire to enlarge the esophagus. Then the wire is removed. Balloon dilators. An endoscope with a small balloon is  inserted into the esophagus. The balloon is inflated to stretch the esophagus and open it up. The procedure may vary among health care providers and hospitals. What can I expect after the procedure? Your blood pressure, heart rate, breathing rate, and blood oxygen level will be monitored until you leave the hospital or clinic. Your throat may feel slightly sore and numb. This will get better over time. You will not be allowed to eat or drink until your throat is no longer numb. When you are able to drink, urinate, and sit on the edge of the bed without nausea or dizziness, you may be able to return home. Follow these instructions at home: Take over-the-counter and prescription medicines only as told by your health care provider. If you were given a sedative during the procedure, it can affect you for several hours. Do not drive or operate machinery until your health care provider says that it is safe. Plan to have a responsible adult care for you for the time you are told. This is important. Follow instructions from your health care provider about any eating or drinking restrictions. Do not use any products that contain nicotine or tobacco, such as cigarettes, e-cigarettes, and chewing tobacco. If you need help quitting, ask your health care provider. Keep all follow-up visits. This is important. Contact a health care provider if: You have a fever. You have pain that is not relieved by medicine. Get help right away if: You have chest pain. You have trouble breathing. You have trouble swallowing. You vomit blood. You have black, tarry, or bloody stools. These symptoms may  represent a serious problem that is an emergency. Do not wait to see if the symptoms will go away. Get medical help right away. Call your local emergency services (911 in the [place] Do not drive yourself to the hospital. Summary Esophageal dilatation, also called esophageal dilation, is a procedure to widen or open a blocked or narrowed part of the esophagus. Plan to have a responsible adult take you home from the hospital or clinic. For this procedure, a numbing medicine may be sprayed into the back of your throat, or you may gargle the medicine. Do not drive or operate machinery until your health care provider says that it is safe. This information is not intended to replace advice given to you by your health care provider. Make sure you discuss any questions you have with your health care provider. Document Revised: 03/05/2020 Document Reviewed: 03/05/2020 Elsevier Patient Education  2024 [employer].

## 2023-04-27 ENCOUNTER — Encounter: Payer: Self-pay | Admitting: *Deleted

## 2023-04-27 NOTE — Telephone Encounter (Addendum)
Pt returned call. She has been moved to 7/15 at 1:45pm. Aware pre-op appt will be rescheduled also.  message sent to endo. Will let know new pre-op appt. New instructions will also be sent to her via MyChart.   Checked [employer] for PA and received message "The following solutions for the service date entered do not require Pre-Authorization by [employer]"

## 2023-04-27 NOTE — Telephone Encounter (Signed)
Sent message via [place],  [facility]

## 2023-04-28 ENCOUNTER — Encounter (HOSPITAL_COMMUNITY): Payer: Self-pay

## 2023-04-28 ENCOUNTER — Encounter (HOSPITAL_COMMUNITY)
Admission: RE | Admit: 2023-04-28 | Discharge: 2023-04-28 | Disposition: A | Discharge status: Home / Self Care | Payer: BC Managed Care – PPO | Source: Ambulatory Visit | Attending: Internal Medicine | Admitting: Internal Medicine

## 2023-04-28 DIAGNOSIS — I10 Essential (primary) hypertension: Secondary | ICD-10-CM

## 2023-04-28 DIAGNOSIS — Z01818 Encounter for other preprocedural examination: Secondary | ICD-10-CM

## 2023-05-11 NOTE — Patient Instructions (Signed)
Cathy Wells  05/11/2023     @PREFPERIOPPHARMACY @   Your procedure is scheduled on  05/16/2023.   Report to [facility] at  1210  P.M.   Call this number if you have problems the morning of surgery:  [phone]  If you experience any cold or flu symptoms such as cough, fever, chills, shortness of breath, etc. between now and your scheduled surgery, please notify us at the above number.   Remember:  Follow the diet instructions given to you by the office.     Use your inhaler before you come and bring your rescue inhaler with you.     Take these medicines the morning of surgery with A SIP OF WATER       xanax(if needed), zyrtec, celexa, omeprazole, zofran (if needed).    Do not wear jewelry, make-up or nail polish, including gel polish,  artificial nails, or any other type of covering on natural nails (fingers and  toes).  Do not wear lotions, powders, or perfumes, or deodorant.  Do not shave 48 hours prior to surgery.  Men may shave face and neck.  Do not bring valuables to the hospital.  [facility] is not responsible for any belongings or valuables.  Contacts, dentures or bridgework may not be worn into surgery.  Leave your suitcase in the car.  After surgery it may be brought to your room.  For patients admitted to the hospital, discharge time will be determined by your treatment team.  Patients discharged the day of surgery will not be allowed to drive home and must have someone with them for 24 hours.    Special instructions:   DO NOT smoke tobacco or vape for 24 hours before your procedure.  Please read over the following fact sheets that you were given. Anesthesia Post-op Instructions and Care and Recovery After Surgery        Upper Endoscopy, Adult, Care After After the procedure, it is common to have a sore throat. It is also common to have: Mild stomach pain or discomfort. Bloating. Nausea. Follow these instructions at home: The  instructions below may help you care for yourself at home. Your health care provider may give you more instructions. If you have questions, ask your health care provider. If you were given a sedative during the procedure, it can affect you for several hours. Do not drive or operate machinery until your health care provider says that it is safe. If you will be going home right after the procedure, plan to have a responsible adult: Take you home from the hospital or clinic. You will not be allowed to drive. Care for you for the time you are told. Follow instructions from your health care provider about what you may eat and drink. Return to your normal activities as told by your health care provider. Ask your health care provider what activities are safe for you. Take over-the-counter and prescription medicines only as told by your health care provider. Contact a health care provider if you: Have a sore throat that lasts longer than one day. Have trouble swallowing. Have a fever. Get help right away if you: Vomit blood or your vomit looks like coffee grounds. Have bloody, black, or tarry stools. Have a very bad sore throat or you cannot swallow. Have difficulty breathing or very bad pain in your chest or abdomen. These symptoms may be an emergency. Get help right away. Call 911. Do not wait to  see if the symptoms will go away. Do not drive yourself to the hospital. Summary After the procedure, it is common to have a sore throat, mild stomach discomfort, bloating, and nausea. If you were given a sedative during the procedure, it can affect you for several hours. Do not drive until your health care provider says that it is safe. Follow instructions from your health care provider about what you may eat and drink. Return to your normal activities as told by your health care provider. This information is not intended to replace advice given to you by your health care provider. Make sure you discuss  any questions you have with your health care provider. Document Revised: 01/27/2022 Document Reviewed: 01/27/2022 Elsevier Patient Education  2024 [employer]vier Inc. Esophageal Dilatation Esophageal dilatation, also called esophageal dilation, is a procedure to widen or open a blocked or narrowed part of the esophagus. The esophagus is the part of the body that moves food and liquid from the mouth to the stomach. You may need this procedure if: You have a buildup of scar tissue in your esophagus that makes it difficult, painful, or impossible to swallow. This can be caused by gastroesophageal reflux disease (GERD). You have cancer of the esophagus. There is a problem with how food moves through your esophagus. In some cases, you may need this procedure repeated at a later time to dilate the esophagus gradually. Tell a health care provider about: Any allergies you have. All medicines you are taking, including vitamins, herbs, eye drops, creams, and over-the-counter medicines. Any problems you or family members have had with anesthetic medicines. Any blood disorders you have. Any surgeries you have had. Any medical conditions you have. Any antibiotic medicines you are required to take before dental procedures. Whether you are pregnant or may be pregnant. What are the risks? Generally, this is a safe procedure. However, problems may occur, including: Bleeding due to a tear in the lining of the esophagus. A hole, or perforation, in the esophagus. What happens before the procedure? Ask your health care provider about: Changing or stopping your regular medicines. This is especially important if you are taking diabetes medicines or blood thinners. Taking medicines such as aspirin and ibuprofen. These medicines can thin your blood. Do not take these medicines unless your health care provider tells you to take them. Taking over-the-counter medicines, vitamins, herbs, and supplements. Follow  instructions from your health care provider about eating or drinking restrictions. Plan to have a responsible adult take you home from the hospital or clinic. Plan to have a responsible adult care for you for the time you are told after you leave the hospital or clinic. This is important. What happens during the procedure? You may be given a medicine to help you relax (sedative). A numbing medicine may be sprayed into the back of your throat, or you may gargle the medicine. Your health care provider may perform the dilatation using various surgical instruments, such as: Simple dilators. This instrument is carefully placed in the esophagus to stretch it. Guided wire bougies. This involves using an endoscope to insert a wire into the esophagus. A dilator is passed over this wire to enlarge the esophagus. Then the wire is removed. Balloon dilators. An endoscope with a small balloon is inserted into the esophagus. The balloon is inflated to stretch the esophagus and open it up. The procedure may vary among health care providers and hospitals. What can I expect after the procedure? Your blood pressure, heart rate, breathing  rate, and blood oxygen level will be monitored until you leave the hospital or clinic. Your throat may feel slightly sore and numb. This will get better over time. You will not be allowed to eat or drink until your throat is no longer numb. When you are able to drink, urinate, and sit on the edge of the bed without nausea or dizziness, you may be able to return home. Follow these instructions at home: Take over-the-counter and prescription medicines only as told by your health care provider. If you were given a sedative during the procedure, it can affect you for several hours. Do not drive or operate machinery until your health care provider says that it is safe. Plan to have a responsible adult care for you for the time you are told. This is important. Follow instructions from  your health care provider about any eating or drinking restrictions. Do not use any products that contain nicotine or tobacco, such as cigarettes, e-cigarettes, and chewing tobacco. If you need help quitting, ask your health care provider. Keep all follow-up visits. This is important. Contact a health care provider if: You have a fever. You have pain that is not relieved by medicine. Get help right away if: You have chest pain. You have trouble breathing. You have trouble swallowing. You vomit blood. You have black, tarry, or bloody stools. These symptoms may represent a serious problem that is an emergency. Do not wait to see if the symptoms will go away. Get medical help right away. Call your local emergency services (911 in the [place] Do not drive yourself to the hospital. Summary Esophageal dilatation, also called esophageal dilation, is a procedure to widen or open a blocked or narrowed part of the esophagus. Plan to have a responsible adult take you home from the hospital or clinic. For this procedure, a numbing medicine may be sprayed into the back of your throat, or you may gargle the medicine. Do not drive or operate machinery until your health care provider says that it is safe. This information is not intended to replace advice given to you by your health care provider. Make sure you discuss any questions you have with your health care provider. Document Revised: 03/05/2020 Document Reviewed: 03/05/2020 Elsevier Patient Education  2024 Elsevier Inc. Monitored Anesthesia Care, Care After The following information offers guidance on how to care for yourself after your procedure. Your health care provider may also give you more specific instructions. If you have problems or questions, contact your health care provider. What can I expect after the procedure? After the procedure, it is common to have: Tiredness. Little or no memory about what happened during or after the  procedure. Impaired judgment when it comes to making decisions. Nausea or vomiting. Some trouble with balance. Follow these instructions at home: For the time period you were told by your health care provider:  Rest. Do not participate in activities where you could fall or become injured. Do not drive or use machinery. Do not drink alcohol. Do not take sleeping pills or medicines that cause drowsiness. Do not make important decisions or sign legal documents. Do not take care of children on your own. Medicines Take over-the-counter and prescription medicines only as told by your health care provider. If you were prescribed antibiotics, take them as told by your health care provider. Do not stop using the antibiotic even if you start to feel better. Eating and drinking Follow instructions from your health care provider about what you may  eat and drink. Drink enough fluid to keep your urine pale yellow. If you vomit: Drink clear fluids slowly and in small amounts as you are able. Clear fluids include water, ice chips, low-calorie sports drinks, and fruit juice that has water added to it (diluted fruit juice). Eat light and bland foods in small amounts as you are able. These foods include bananas, applesauce, rice, lean meats, toast, and crackers. General instructions  Have a responsible adult stay with you for the time you are told. It is important to have someone help care for you until you are awake and alert. If you have sleep apnea, surgery and some medicines can increase your risk for breathing problems. Follow instructions from your health care provider about wearing your sleep device: When you are sleeping. This includes during daytime naps. While taking prescription pain medicines, sleeping medicines, or medicines that make you drowsy. Do not use any products that contain nicotine or tobacco. These products include cigarettes, chewing tobacco, and vaping devices, such as  e-cigarettes. If you need help quitting, ask your health care provider. Contact a health care provider if: You feel nauseous or vomit every time you eat or drink. You feel light-headed. You are still sleepy or having trouble with balance after 24 hours. You get a rash. You have a fever. You have redness or swelling around the IV site. Get help right away if: You have trouble breathing. You have new confusion after you get home. These symptoms may be an emergency. Get help right away. Call 911. Do not wait to see if the symptoms will go away. Do not drive yourself to the hospital. This information is not intended to replace advice given to you by your health care provider. Make sure you discuss any questions you have with your health care provider. Document Revised: 03/15/2022 Document Reviewed: 03/15/2022 Elsevier Patient Education  2024 [employer].

## 2023-05-12 ENCOUNTER — Encounter (HOSPITAL_COMMUNITY): Payer: Self-pay

## 2023-05-12 ENCOUNTER — Ambulatory Visit (INDEPENDENT_AMBULATORY_CARE_PROVIDER_SITE_OTHER): Payer: BC Managed Care – PPO | Admitting: Gastroenterology

## 2023-05-12 ENCOUNTER — Encounter (HOSPITAL_COMMUNITY)
Admission: RE | Admit: 2023-05-12 | Discharge: 2023-05-12 | Disposition: A | Discharge status: Home / Self Care | Payer: BC Managed Care – PPO | Source: Ambulatory Visit | Attending: Internal Medicine | Admitting: Internal Medicine

## 2023-05-12 DIAGNOSIS — Z01818 Encounter for other preprocedural examination: Secondary | ICD-10-CM | POA: Diagnosis not present

## 2023-05-12 DIAGNOSIS — I10 Essential (primary) hypertension: Secondary | ICD-10-CM | POA: Diagnosis not present

## 2023-05-12 HISTORY — DX: Personal history of urinary calculi: Z87.442

## 2023-05-12 HISTORY — DX: Anxiety disorder, unspecified: F41.9

## 2023-05-12 HISTORY — DX: Essential (primary) hypertension: I10

## 2023-05-12 HISTORY — DX: Nausea with vomiting, unspecified: R11.2

## 2023-05-12 HISTORY — DX: Other specified postprocedural states: Z98.890

## 2023-05-12 LAB — POCT PREGNANCY, URINE: Preg Test, Ur: NEGATIVE

## 2023-05-16 ENCOUNTER — Ambulatory Visit (HOSPITAL_COMMUNITY): Payer: BC Managed Care – PPO | Admitting: Anesthesiology

## 2023-05-16 ENCOUNTER — Ambulatory Visit (HOSPITAL_COMMUNITY)
Admission: RE | Admit: 2023-05-16 | Discharge: 2023-05-16 | Disposition: A | Discharge status: Home / Self Care | Payer: BC Managed Care – PPO | Source: Ambulatory Visit | Attending: Internal Medicine | Admitting: Internal Medicine

## 2023-05-16 ENCOUNTER — Encounter (HOSPITAL_COMMUNITY)
Admission: RE | Disposition: A | Discharge status: Home / Self Care | Payer: Self-pay | Source: Ambulatory Visit | Attending: Internal Medicine

## 2023-05-16 ENCOUNTER — Encounter (HOSPITAL_COMMUNITY): Payer: Self-pay

## 2023-05-16 DIAGNOSIS — R1310 Dysphagia, unspecified: Secondary | ICD-10-CM | POA: Insufficient documentation

## 2023-05-16 DIAGNOSIS — K319 Disease of stomach and duodenum, unspecified: Secondary | ICD-10-CM | POA: Diagnosis not present

## 2023-05-16 DIAGNOSIS — R1013 Epigastric pain: Secondary | ICD-10-CM | POA: Diagnosis not present

## 2023-05-16 DIAGNOSIS — Z79899 Other long term (current) drug therapy: Secondary | ICD-10-CM | POA: Insufficient documentation

## 2023-05-16 DIAGNOSIS — Z6841 Body Mass Index (BMI) 40.0 and over, adult: Secondary | ICD-10-CM | POA: Diagnosis not present

## 2023-05-16 DIAGNOSIS — K2289 Other specified disease of esophagus: Secondary | ICD-10-CM | POA: Diagnosis not present

## 2023-05-16 DIAGNOSIS — K2100 Gastro-esophageal reflux disease with esophagitis, without bleeding: Secondary | ICD-10-CM | POA: Diagnosis not present

## 2023-05-16 DIAGNOSIS — K219 Gastro-esophageal reflux disease without esophagitis: Secondary | ICD-10-CM

## 2023-05-16 DIAGNOSIS — I10 Essential (primary) hypertension: Secondary | ICD-10-CM | POA: Insufficient documentation

## 2023-05-16 DIAGNOSIS — K2970 Gastritis, unspecified, without bleeding: Secondary | ICD-10-CM

## 2023-05-16 HISTORY — PX: ESOPHAGOGASTRODUODENOSCOPY (EGD) WITH PROPOFOL: SHX5813

## 2023-05-16 HISTORY — PX: BIOPSY: SHX5522

## 2023-05-16 SURGERY — ESOPHAGOGASTRODUODENOSCOPY (EGD) WITH PROPOFOL
Anesthesia: General

## 2023-05-16 MED ORDER — PROPOFOL 10 MG/ML IV BOLUS
INTRAVENOUS | Status: DC | PRN
  Administered 2023-05-16: 150 mg via INTRAVENOUS
  Administered 2023-05-16: 30 mg via INTRAVENOUS

## 2023-05-16 MED ORDER — LACTATED RINGERS IV SOLN: INTRAVENOUS | Status: DC

## 2023-05-16 NOTE — Transfer of Care (Signed)
Immediate Anesthesia Transfer of Care Note  Patient: Cathy Wells  Procedure(s) Performed: ESOPHAGOGASTRODUODENOSCOPY (EGD) WITH PROPOFOL BIOPSY  Patient Location: Endoscopy Unit  Anesthesia Type:General  Level of Consciousness: awake and patient cooperative  Airway & Oxygen Therapy: Patient Spontanous Breathing  Post-op Assessment: Report given to RN and Post -op Vital signs reviewed and stable  Post vital signs: Reviewed and stable  Last Vitals:  Vitals Value Taken Time  BP 137/99 05/16/23 1553  Temp 36.9 C 05/16/23 1553  Pulse 75 05/16/23 1553  Resp 15 05/16/23 1553  SpO2 97 % 05/16/23 1553    Last Pain:  Vitals:   05/16/23 1553  TempSrc: Oral  PainSc: 0-No pain         Complications: No notable events documented.

## 2023-05-16 NOTE — Anesthesia Procedure Notes (Signed)
Date/Time: 05/16/2023 3:39 PM  Performed by: Franco Nones, CRNAPre-anesthesia Checklist: Patient identified, Emergency Drugs available, Suction available, Timeout performed and Patient being monitored Patient Re-evaluated:Patient Re-evaluated prior to induction Comments: [employer]

## 2023-05-16 NOTE — Progress Notes (Signed)
Patient needs to be excused from work 05/16/2023 and 05/17/2023    Cathy John, RN [facility] Short Stay

## 2023-05-16 NOTE — Interval H&P Note (Signed)
History and Physical Interval Note:  05/16/2023 3:35 PM  Cathy Wells  has presented today for surgery, with the diagnosis of GERD,dysphagia,epigastric pain,N/V.  The various methods of treatment have been discussed with the patient and family. After consideration of risks, benefits and other options for treatment, the patient has consented to  Procedure(s) with comments: ESOPHAGOGASTRODUODENOSCOPY (EGD) WITH PROPOFOL (N/A) - 1:15 pm, asa 3 BALLOON DILATION (N/A) as a surgical intervention.  The patient's history has been reviewed, patient examined, no change in status, stable for surgery.  I have reviewed the patient's chart and labs.  Questions were answered to the patient's satisfaction.     Lanelle Bal

## 2023-05-16 NOTE — Discharge Instructions (Addendum)
EGD Discharge instructions Please read the instructions outlined below and refer to this sheet in the next few weeks. These discharge instructions provide you with general information on caring for yourself after you leave the hospital. Your doctor may also give you specific instructions. While your treatment has been planned according to the most current medical practices available, unavoidable complications occasionally occur. If you have any problems or questions after discharge, please call your doctor. ACTIVITY You may resume your regular activity but move at a slower pace for the next 24 hours.  Take frequent rest periods for the next 24 hours.  Walking will help expel (get rid of) the air and reduce the bloated feeling in your abdomen.  No driving for 24 hours (because of the anesthesia (medicine) used during the test).  You may shower.  Do not sign any important legal documents or operate any machinery for 24 hours (because of the anesthesia used during the test).  NUTRITION Drink plenty of fluids.  You may resume your normal diet.  Begin with a light meal and progress to your normal diet.  Avoid alcoholic beverages for 24 hours or as instructed by your caregiver.  MEDICATIONS You may resume your normal medications unless your caregiver tells you otherwise.  WHAT YOU CAN EXPECT TODAY You may experience abdominal discomfort such as a feeling of fullness or "gas" pains.  FOLLOW-UP Your doctor will discuss the results of your test with you.  SEEK IMMEDIATE MEDICAL ATTENTION IF ANY OF THE FOLLOWING OCCUR: Excessive nausea (feeling sick to your stomach) and/or vomiting.  Severe abdominal pain and distention (swelling).  Trouble swallowing.  Temperature over 101 F (37.8 C).  Rectal bleeding or vomiting of blood.   Your EGD revealed mild amount inflammation in your stomach.  I took biopsies of this to rule out infection with a bacteria called H. pylori.  Await pathology results, my  office will contact you.  You also had evidence of acid reflux disease.  I took samples of your esophagus.  No tightenings of your esophagus.  I did not need to dilate today.  Small bowel appeared normal.  Continue on omeprazole twice daily.  Follow-up in GI office in 2 to 3 months.   I hope you have a great rest of your week!  Hennie Duos. Marletta Lor, D.O. Gastroenterology and Hepatology [facility]

## 2023-05-16 NOTE — Anesthesia Postprocedure Evaluation (Signed)
Anesthesia Post Note  Patient: Cathy Wells  Procedure(s) Performed: ESOPHAGOGASTRODUODENOSCOPY (EGD) WITH PROPOFOL BIOPSY  Patient location during evaluation: Phase II Anesthesia Type: General Level of consciousness: awake and alert and oriented Pain management: pain level controlled Vital Signs Assessment: post-procedure vital signs reviewed and stable Respiratory status: spontaneous breathing, nonlabored ventilation and respiratory function stable Cardiovascular status: blood pressure returned to baseline and stable Postop Assessment: no apparent nausea or vomiting Anesthetic complications: no  No notable events documented.   Last Vitals:  Vitals:   05/16/23 1234 05/16/23 1553  BP: 106/73 (!) 137/99  Pulse: 74 75  Resp: 14 15  Temp: 36.6 C 36.9 C  SpO2: 97% 97%    Last Pain:  Vitals:   05/16/23 1553  TempSrc: Oral  PainSc: 0-No pain                 [name]

## 2023-05-16 NOTE — Op Note (Addendum)
[facility] Patient Name: Cathy Wells Procedure Date: 05/16/2023 3:31 PM MRN: [id number] Date of Birth: [birth year] Attending MD: Hennie Duos. Marletta Lor , [place], [id number] CSN: [id number] Age: 32 Admit Type: Outpatient Procedure:                Upper GI endoscopy Indications:              Epigastric abdominal pain, Dysphagia, Heartburn Providers:                Hennie Duos. Marletta Lor, DO, Jessica Boudreaux, Crystal                            Page, [employer]. Jessee Avers, Technician Referring MD:              Medicines:                See the Anesthesia note for documentation of the                            administered medications Complications:            No immediate complications. Estimated Blood Loss:     Estimated blood loss was minimal. Procedure:                Pre-Anesthesia Assessment:                           - The anesthesia plan was to use monitored                            anesthesia care (MAC).                           After obtaining informed consent, the endoscope was                            passed under direct vision. Throughout the                            procedure, the patient's blood pressure, pulse, and                            oxygen saturations were monitored continuously. The                            [id number]-H190 ([id number]) scope was introduced through the                            mouth, and advanced to the second part of duodenum.                            The upper GI endoscopy was accomplished without                            difficulty. The patient tolerated the procedure  well. Scope In: 3:43:40 PM Scope Out: 3:48:05 PM Total Procedure Duration: 0 hours 4 minutes 25 seconds  Findings:      Non-severe esophagitis with no bleeding was found at the       gastroesophageal junction. Biopsies were taken with a cold forceps for       histology.      Biopsies were taken with a cold forceps in the middle third of the        esophagus for histology.      Patchy mild inflammation characterized by erythema was found in the       gastric body and in the gastric antrum. Biopsies were taken with a cold       forceps for Helicobacter pylori testing.      The duodenal bulb, first portion of the duodenum and second portion of       the duodenum were normal. Impression:               - Non-severe reflux esophagitis with no bleeding.                            Biopsied.                           - Gastritis. Biopsied.                           - Normal duodenal bulb, first portion of the                            duodenum and second portion of the duodenum.                           - Biopsies were taken with a cold forceps for                            histology in the middle third of the esophagus. Moderate Sedation:      Per Anesthesia Care Recommendation:           - Patient has a contact number available for                            emergencies. The signs and symptoms of potential                            delayed complications were discussed with the                            patient. Return to normal activities tomorrow.                            Written discharge instructions were provided to the                            patient.                           - Resume previous diet.                           -  Continue present medications.                           - Await pathology results.                           - Return to GI clinic in 3 months.                           - Use a proton pump inhibitor PO BID. Procedure Code(s):        --- Professional ---                           [phone], Esophagogastroduodenoscopy, flexible,                            transoral; with biopsy, single or multiple Diagnosis Code(s):        --- Professional ---                           K21.00, Gastro-esophageal reflux disease with                            esophagitis, without bleeding                            K29.70, Gastritis, unspecified, without bleeding                           R10.13, Epigastric pain                           R13.10, Dysphagia, unspecified                           R12, Heartburn CPT copyright 2022 American Medical Association. All rights reserved. The codes documented in this report are preliminary and upon coder review may  be revised to meet current compliance requirements. Hennie Duos. Marletta Lor, DO Hennie Duos. Marletta Lor, DO 05/16/2023 3:52:25 PM This report has been signed electronically. Number of Addenda: 0

## 2023-05-16 NOTE — Anesthesia Preprocedure Evaluation (Signed)
Anesthesia Evaluation  Patient identified by MRN, date of birth, ID band Patient awake    Reviewed: Allergy & Precautions, H&P , NPO status , Patient's Chart, lab work & pertinent test results  History of Anesthesia Complications (+) PONV and history of anesthetic complications  Airway Mallampati: III  TM Distance: >3 FB Neck ROM: Full    Dental no notable dental hx. (+) Dental Advisory Given, Teeth Intact   Pulmonary asthma    Pulmonary exam normal breath sounds clear to auscultation       Cardiovascular hypertension, Pt. on medications Normal cardiovascular exam Rhythm:Regular Rate:Normal     Neuro/Psych  PSYCHIATRIC DISORDERS Anxiety     negative neurological ROS     GI/Hepatic ,GERD  Medicated,,(+)     substance abuse  marijuana use  Endo/Other    Morbid obesity  Renal/GU negative Renal ROS  negative genitourinary   Musculoskeletal negative musculoskeletal ROS (+)    Abdominal   Peds negative pediatric ROS (+)  Hematology negative hematology ROS (+)   Anesthesia Other Findings   Reproductive/Obstetrics negative OB ROS                             Anesthesia Physical Anesthesia Plan  ASA: 3  Anesthesia Plan: General   Post-op Pain Management: Minimal or no pain anticipated   Induction: Intravenous  PONV Risk Score and Plan: 1 and Propofol infusion  Airway Management Planned: Nasal Cannula and Natural Airway  Additional Equipment:   Intra-op Plan:   Post-operative Plan:   Informed Consent: I have reviewed the patients History and Physical, chart, labs and discussed the procedure including the risks, benefits and alternatives for the proposed anesthesia with the patient or authorized representative who has indicated his/her understanding and acceptance.     Dental advisory given  Plan Discussed with: CRNA and Surgeon  Anesthesia Plan Comments:         Anesthesia Quick Evaluation

## 2023-05-18 LAB — SURGICAL PATHOLOGY

## 2023-05-23 ENCOUNTER — Encounter (HOSPITAL_COMMUNITY): Payer: Self-pay | Admitting: Internal Medicine

## 2023-05-30 ENCOUNTER — Ambulatory Visit (INDEPENDENT_AMBULATORY_CARE_PROVIDER_SITE_OTHER): Payer: BC Managed Care – PPO | Admitting: Gastroenterology

## 2023-06-09 DIAGNOSIS — J209 Acute bronchitis, unspecified: Secondary | ICD-10-CM | POA: Diagnosis not present

## 2023-06-09 DIAGNOSIS — I10 Essential (primary) hypertension: Secondary | ICD-10-CM | POA: Diagnosis not present

## 2023-06-09 DIAGNOSIS — K2100 Gastro-esophageal reflux disease with esophagitis, without bleeding: Secondary | ICD-10-CM | POA: Diagnosis not present

## 2023-06-13 ENCOUNTER — Ambulatory Visit: Payer: BC Managed Care – PPO | Admitting: Gastroenterology

## 2023-06-14 ENCOUNTER — Ambulatory Visit: Payer: BC Managed Care – PPO | Admitting: Gastroenterology

## 2023-06-14 ENCOUNTER — Encounter: Payer: Self-pay | Admitting: Gastroenterology

## 2023-07-14 DIAGNOSIS — Z6841 Body Mass Index (BMI) 40.0 and over, adult: Secondary | ICD-10-CM | POA: Diagnosis not present

## 2023-07-14 DIAGNOSIS — T148XXA Other injury of unspecified body region, initial encounter: Secondary | ICD-10-CM | POA: Diagnosis not present

## 2023-07-14 DIAGNOSIS — R030 Elevated blood-pressure reading, without diagnosis of hypertension: Secondary | ICD-10-CM | POA: Diagnosis not present

## 2023-07-22 DIAGNOSIS — U071 COVID-19: Secondary | ICD-10-CM | POA: Diagnosis not present

## 2023-07-22 DIAGNOSIS — J209 Acute bronchitis, unspecified: Secondary | ICD-10-CM | POA: Diagnosis not present

## 2023-07-22 DIAGNOSIS — I10 Essential (primary) hypertension: Secondary | ICD-10-CM | POA: Diagnosis not present

## 2023-09-07 DIAGNOSIS — Z6841 Body Mass Index (BMI) 40.0 and over, adult: Secondary | ICD-10-CM | POA: Diagnosis not present

## 2023-09-07 DIAGNOSIS — R6884 Jaw pain: Secondary | ICD-10-CM | POA: Diagnosis not present

## 2023-09-07 DIAGNOSIS — M6598 Unspecified synovitis and tenosynovitis, other site: Secondary | ICD-10-CM | POA: Diagnosis not present

## 2023-10-02 DIAGNOSIS — R030 Elevated blood-pressure reading, without diagnosis of hypertension: Secondary | ICD-10-CM | POA: Diagnosis not present

## 2023-10-02 DIAGNOSIS — R509 Fever, unspecified: Secondary | ICD-10-CM | POA: Diagnosis not present

## 2023-10-02 DIAGNOSIS — J029 Acute pharyngitis, unspecified: Secondary | ICD-10-CM | POA: Diagnosis not present

## 2023-10-02 DIAGNOSIS — B349 Viral infection, unspecified: Secondary | ICD-10-CM | POA: Diagnosis not present

## 2023-11-18 DIAGNOSIS — J4521 Mild intermittent asthma with (acute) exacerbation: Secondary | ICD-10-CM | POA: Diagnosis not present

## 2023-11-18 DIAGNOSIS — Z20828 Contact with and (suspected) exposure to other viral communicable diseases: Secondary | ICD-10-CM | POA: Diagnosis not present

## 2023-11-18 DIAGNOSIS — I10 Essential (primary) hypertension: Secondary | ICD-10-CM | POA: Diagnosis not present

## 2023-11-18 DIAGNOSIS — Z6841 Body Mass Index (BMI) 40.0 and over, adult: Secondary | ICD-10-CM | POA: Diagnosis not present

## 2023-11-18 DIAGNOSIS — J101 Influenza due to other identified influenza virus with other respiratory manifestations: Secondary | ICD-10-CM | POA: Diagnosis not present

## 2023-11-28 DIAGNOSIS — J101 Influenza due to other identified influenza virus with other respiratory manifestations: Secondary | ICD-10-CM | POA: Diagnosis not present

## 2023-11-28 DIAGNOSIS — Z6841 Body Mass Index (BMI) 40.0 and over, adult: Secondary | ICD-10-CM | POA: Diagnosis not present

## 2023-11-28 DIAGNOSIS — F419 Anxiety disorder, unspecified: Secondary | ICD-10-CM | POA: Diagnosis not present

## 2023-11-28 DIAGNOSIS — J4521 Mild intermittent asthma with (acute) exacerbation: Secondary | ICD-10-CM | POA: Diagnosis not present

## 2024-05-23 ENCOUNTER — Other Ambulatory Visit (HOSPITAL_COMMUNITY): Payer: Self-pay | Admitting: Family Medicine

## 2024-05-23 DIAGNOSIS — N202 Calculus of kidney with calculus of ureter: Secondary | ICD-10-CM

## 2024-05-25 ENCOUNTER — Other Ambulatory Visit: Payer: Self-pay | Admitting: Gastroenterology

## 2024-05-29 ENCOUNTER — Ambulatory Visit (HOSPITAL_COMMUNITY)
Admission: RE | Admit: 2024-05-29 | Discharge: 2024-05-29 | Disposition: A | Discharge status: Home / Self Care | Payer: Self-pay | Source: Ambulatory Visit | Attending: Family Medicine | Admitting: Family Medicine

## 2024-05-29 DIAGNOSIS — N202 Calculus of kidney with calculus of ureter: Secondary | ICD-10-CM | POA: Insufficient documentation

## 2024-06-22 ENCOUNTER — Encounter: Payer: Self-pay | Admitting: Radiology

## 2024-08-15 ENCOUNTER — Encounter (INDEPENDENT_AMBULATORY_CARE_PROVIDER_SITE_OTHER): Payer: Self-pay | Admitting: Gastroenterology

## 2024-09-03 ENCOUNTER — Encounter: Payer: Self-pay | Admitting: Radiology

## 2024-10-23 ENCOUNTER — Emergency Department (HOSPITAL_COMMUNITY)

## 2024-10-23 ENCOUNTER — Ambulatory Visit (HOSPITAL_COMMUNITY)
Admission: EM | Admit: 2024-10-23 | Discharge: 2024-10-23 | Disposition: A | Discharge status: Home / Self Care | Payer: Self-pay

## 2024-10-23 ENCOUNTER — Encounter (HOSPITAL_COMMUNITY): Admission: EM | Disposition: A | Discharge status: Home / Self Care | Payer: Self-pay | Source: Home / Self Care

## 2024-10-23 ENCOUNTER — Emergency Department (HOSPITAL_BASED_OUTPATIENT_CLINIC_OR_DEPARTMENT_OTHER): Payer: Self-pay | Admitting: Anesthesiology

## 2024-10-23 ENCOUNTER — Emergency Department (HOSPITAL_COMMUNITY): Payer: Self-pay | Admitting: Anesthesiology

## 2024-10-23 ENCOUNTER — Encounter (HOSPITAL_COMMUNITY): Payer: Self-pay

## 2024-10-23 ENCOUNTER — Other Ambulatory Visit: Payer: Self-pay

## 2024-10-23 DIAGNOSIS — N201 Calculus of ureter: Secondary | ICD-10-CM

## 2024-10-23 DIAGNOSIS — E66813 Obesity, class 3: Secondary | ICD-10-CM | POA: Diagnosis not present

## 2024-10-23 DIAGNOSIS — I10 Essential (primary) hypertension: Secondary | ICD-10-CM

## 2024-10-23 DIAGNOSIS — N132 Hydronephrosis with renal and ureteral calculous obstruction: Secondary | ICD-10-CM | POA: Insufficient documentation

## 2024-10-23 DIAGNOSIS — J45909 Unspecified asthma, uncomplicated: Secondary | ICD-10-CM | POA: Diagnosis not present

## 2024-10-23 DIAGNOSIS — K219 Gastro-esophageal reflux disease without esophagitis: Secondary | ICD-10-CM | POA: Diagnosis not present

## 2024-10-23 DIAGNOSIS — Z8249 Family history of ischemic heart disease and other diseases of the circulatory system: Secondary | ICD-10-CM | POA: Diagnosis not present

## 2024-10-23 DIAGNOSIS — Z825 Family history of asthma and other chronic lower respiratory diseases: Secondary | ICD-10-CM | POA: Diagnosis not present

## 2024-10-23 DIAGNOSIS — Z87442 Personal history of urinary calculi: Secondary | ICD-10-CM

## 2024-10-23 HISTORY — PX: CYSTOSCOPY W/ URETERAL STENT PLACEMENT: SHX1429

## 2024-10-23 LAB — URINALYSIS, ROUTINE W REFLEX MICROSCOPIC
Bilirubin Urine: NEGATIVE
Glucose, UA: NEGATIVE mg/dL
Ketones, ur: NEGATIVE mg/dL
Nitrite: NEGATIVE
Protein, ur: NEGATIVE mg/dL
Specific Gravity, Urine: 1.01 (ref 1.005–1.030)
pH: 6 (ref 5.0–8.0)

## 2024-10-23 LAB — CBC
HCT: 35.7 % — ABNORMAL LOW (ref 36.0–46.0)
Hemoglobin: 11.8 g/dL — ABNORMAL LOW (ref 12.0–15.0)
MCH: 27.7 pg (ref 26.0–34.0)
MCHC: 33.1 g/dL (ref 30.0–36.0)
MCV: 83.8 fL (ref 80.0–100.0)
Platelets: 370 K/uL (ref 150–400)
RBC: 4.26 MIL/uL (ref 3.87–5.11)
RDW: 12.2 % (ref 11.5–15.5)
WBC: 11.9 K/uL — ABNORMAL HIGH (ref 4.0–10.5)
nRBC: 0 % (ref 0.0–0.2)

## 2024-10-23 LAB — BASIC METABOLIC PANEL WITH GFR
Anion gap: 14 (ref 5–15)
BUN: 10 mg/dL (ref 6–20)
CO2: 23 mmol/L (ref 22–32)
Calcium: 9.5 mg/dL (ref 8.9–10.3)
Chloride: 99 mmol/L (ref 98–111)
Creatinine, Ser: 0.93 mg/dL (ref 0.44–1.00)
GFR, Estimated: >60 mL/min
Glucose, Bld: 98 mg/dL (ref 70–99)
Potassium: 3.9 mmol/L (ref 3.5–5.1)
Sodium: 136 mmol/L (ref 135–145)

## 2024-10-23 LAB — HCG, SERUM, QUALITATIVE: Preg, Serum: NEGATIVE

## 2024-10-23 SURGERY — CYSTOSCOPY, WITH RETROGRADE PYELOGRAM AND URETERAL STENT INSERTION
Anesthesia: General | Site: Ureter | Laterality: Right

## 2024-10-23 MED ORDER — STERILE WATER FOR IRRIGATION IR SOLN
Status: DC | PRN
  Administered 2024-10-23: 3000 mL

## 2024-10-23 MED ORDER — OXYCODONE HCL 5 MG PO TABS
5.0000 mg | ORAL_TABLET | Freq: Once | ORAL | Status: AC
  Administered 2024-10-23: 5 mg via ORAL

## 2024-10-23 MED ORDER — DEXTROSE 5 % IV SOLN
INTRAVENOUS | Status: DC | PRN
  Administered 2024-10-23: 2 g via INTRAVENOUS

## 2024-10-23 MED ORDER — SODIUM CHLORIDE 0.9 % IV SOLN
INTRAVENOUS | Status: AC
  Filled 2024-10-23: qty 20

## 2024-10-23 MED ORDER — OXYCODONE HCL 5 MG PO TABS: 5.0000 mg | ORAL_TABLET | Freq: Once | ORAL | Status: DC

## 2024-10-23 MED ORDER — LACTATED RINGERS IV SOLN: INTRAVENOUS | Status: DC | PRN

## 2024-10-23 MED ORDER — OXYCODONE HCL 5 MG PO TABS
ORAL_TABLET | ORAL | Status: AC
  Filled 2024-10-23: qty 1

## 2024-10-23 MED ORDER — HYDROMORPHONE HCL 1 MG/ML IJ SOLN
0.5000 mg | Freq: Once | INTRAMUSCULAR | Status: AC
  Administered 2024-10-23: 0.5 mg via INTRAVENOUS
  Filled 2024-10-23: qty 1

## 2024-10-23 MED ORDER — PROPOFOL 10 MG/ML IV BOLUS
INTRAVENOUS | Status: DC | PRN
  Administered 2024-10-23: 200 mg via INTRAVENOUS

## 2024-10-23 MED ORDER — ACETAMINOPHEN 10 MG/ML IV SOLN
INTRAVENOUS | Status: DC | PRN
  Administered 2024-10-23: 1000 mg via INTRAVENOUS

## 2024-10-23 MED ORDER — IOHEXOL 300 MG/ML  SOLN
INTRAMUSCULAR | Status: DC | PRN
  Administered 2024-10-23: 4 mL

## 2024-10-23 MED ORDER — LIDOCAINE HCL (PF) 2 % IJ SOLN
INTRAMUSCULAR | Status: DC | PRN
  Administered 2024-10-23: 100 mg via INTRADERMAL

## 2024-10-23 MED ORDER — DEXAMETHASONE SOD PHOSPHATE PF 10 MG/ML IJ SOLN
INTRAMUSCULAR | Status: DC | PRN
  Administered 2024-10-23: 4 mg via INTRAVENOUS

## 2024-10-23 MED ORDER — LIDOCAINE HCL (PF) 2 % IJ SOLN
INTRAMUSCULAR | Status: AC
  Filled 2024-10-23: qty 5

## 2024-10-23 MED ORDER — PROPOFOL 500 MG/50ML IV EMUL
INTRAVENOUS | Status: DC | PRN
  Administered 2024-10-23: 250 ug/kg/min via INTRAVENOUS

## 2024-10-23 MED ORDER — TAMSULOSIN HCL 0.4 MG PO CAPS
0.4000 mg | ORAL_CAPSULE | Freq: Once | ORAL | Status: AC
  Administered 2024-10-23: 0.4 mg via ORAL
  Filled 2024-10-23: qty 1

## 2024-10-23 MED ORDER — DROPERIDOL 2.5 MG/ML IJ SOLN: 0.6250 mg | Freq: Once | INTRAMUSCULAR | Status: DC | PRN

## 2024-10-23 MED ORDER — ONDANSETRON HCL 4 MG PO TABS: 4.0000 mg | ORAL_TABLET | Freq: Four times a day (QID) | ORAL | 0 refills | Status: DC | PRN

## 2024-10-23 MED ORDER — HYOSCYAMINE SULFATE 0.125 MG PO TBDP
0.2500 mg | ORAL_TABLET | Freq: Once | ORAL | Status: AC
  Administered 2024-10-23: 0.25 mg via ORAL
  Filled 2024-10-23 (×2): qty 2

## 2024-10-23 MED ORDER — PROPOFOL 10 MG/ML IV BOLUS
INTRAVENOUS | Status: AC
  Filled 2024-10-23: qty 20

## 2024-10-23 MED ORDER — HYDROMORPHONE HCL 1 MG/ML IJ SOLN
1.0000 mg | Freq: Once | INTRAMUSCULAR | Status: AC
  Administered 2024-10-23: 1 mg via INTRAVENOUS
  Filled 2024-10-23: qty 1

## 2024-10-23 MED ORDER — ONDANSETRON HCL 4 MG/2ML IJ SOLN
INTRAMUSCULAR | Status: DC | PRN
  Administered 2024-10-23: 4 mg via INTRAVENOUS

## 2024-10-23 MED ORDER — KETOROLAC TROMETHAMINE 15 MG/ML IJ SOLN
15.0000 mg | Freq: Once | INTRAMUSCULAR | Status: AC
  Administered 2024-10-23: 15 mg via INTRAVENOUS
  Filled 2024-10-23: qty 1

## 2024-10-23 MED ORDER — ACETAMINOPHEN 500 MG PO TABS: 1000.0000 mg | ORAL_TABLET | Freq: Once | ORAL | Status: DC

## 2024-10-23 MED ORDER — ONDANSETRON HCL 4 MG/2ML IJ SOLN
INTRAMUSCULAR | Status: AC
  Filled 2024-10-23: qty 2

## 2024-10-23 MED ORDER — FENTANYL CITRATE (PF) 100 MCG/2ML IJ SOLN
INTRAMUSCULAR | Status: DC | PRN
  Administered 2024-10-23 (×2): 50 ug via INTRAVENOUS

## 2024-10-23 MED ORDER — MIDAZOLAM HCL 2 MG/2ML IJ SOLN
INTRAMUSCULAR | Status: AC
  Filled 2024-10-23: qty 2

## 2024-10-23 MED ORDER — FENTANYL CITRATE (PF) 50 MCG/ML IJ SOSY: 25.0000 ug | PREFILLED_SYRINGE | INTRAMUSCULAR | Status: DC | PRN

## 2024-10-23 MED ORDER — SODIUM CHLORIDE 0.9 % IV BOLUS
1000.0000 mL | Freq: Once | INTRAVENOUS | Status: AC
  Administered 2024-10-23: 1000 mL via INTRAVENOUS

## 2024-10-23 MED ORDER — PROCHLORPERAZINE EDISYLATE 10 MG/2ML IJ SOLN
10.0000 mg | Freq: Once | INTRAMUSCULAR | Status: AC
  Administered 2024-10-23: 10 mg via INTRAVENOUS
  Filled 2024-10-23: qty 2

## 2024-10-23 MED ORDER — CIPROFLOXACIN IN D5W 400 MG/200ML IV SOLN
400.0000 mg | Freq: Once | INTRAVENOUS | Status: AC
  Administered 2024-10-23: 400 mg via INTRAVENOUS
  Filled 2024-10-23: qty 200

## 2024-10-23 MED ORDER — FENTANYL CITRATE (PF) 100 MCG/2ML IJ SOLN
INTRAMUSCULAR | Status: AC
  Filled 2024-10-23: qty 2

## 2024-10-23 MED ORDER — TAMSULOSIN HCL 0.4 MG PO CAPS: 0.4000 mg | ORAL_CAPSULE | Freq: Every day | ORAL | 0 refills | Status: DC

## 2024-10-23 MED ORDER — OXYCODONE-ACETAMINOPHEN 5-325 MG PO TABS: 1.0000 | ORAL_TABLET | ORAL | 0 refills | Status: DC | PRN

## 2024-10-23 MED ORDER — MIDAZOLAM HCL (PF) 2 MG/2ML IJ SOLN
INTRAMUSCULAR | Status: DC | PRN
  Administered 2024-10-23: 2 mg via INTRAVENOUS

## 2024-10-23 SURGICAL SUPPLY — 11 items
BAG URO CATCHER STRL LF (MISCELLANEOUS) ×1 IMPLANT
CATH URETL OPEN END 6FR 70 (CATHETERS) ×1 IMPLANT
CLOTH BEACON ORANGE TIMEOUT ST (SAFETY) ×1 IMPLANT
GLOVE SURG LX STRL 8.0 MICRO (GLOVE) ×1 IMPLANT
GOWN STRL REUS W/ TWL XL LVL3 (GOWN DISPOSABLE) ×2 IMPLANT
GUIDEWIRE STRT TIP .038X150X3 (WIRE) IMPLANT
GUIDEWIRE ZIPWRE .038 STRAIGHT (WIRE) ×1 IMPLANT
MANIFOLD NEPTUNE II (INSTRUMENTS) ×1 IMPLANT
PACK CYSTO (CUSTOM PROCEDURE TRAY) ×1 IMPLANT
STENT URET 6FRX26 CONTOUR (STENTS) IMPLANT
TUBING CONNECTING 10 (TUBING) ×1 IMPLANT

## 2024-10-23 NOTE — Op Note (Signed)
.  Preoperative diagnosis: right distal ureteral Mucci, intractable pain  Postoperative diagnosis: Same  Procedure: 1 cystoscopy 2. right retrograde pyelography 3.  Intraoperative fluoroscopy, under one hour, with interpretation 4. right 6 x 26 JJ stent placement  Attending: [name]  Anesthesia: General  Estimated blood loss: None  Drains: Right 6 x 26 JJ ureteral stent without tether,   Specimens: none  Antibiotics:  Rocephin   Findings: right mid ureteral Hasting. Moderate hydronephrosis. No masses/lesions in the bladder. Ureteral orifices in normal anatomic location.  Indications: Patient is a 33 year old female with a history of left ureteral Wilbourne and intractable.  After discussing treatment options, they decided proceed with left stent placement.  Procedure in detail: The patient was brought to the operating room and a brief timeout was done to ensure correct patient, correct procedure, correct site.  General anesthesia was administered patient was placed in dorsal lithotomy position.  Their genitalia was then prepped and draped in usual sterile fashion.  A rigid 22 French cystoscope was passed in the urethra and the bladder.  Bladder was inspected free masses or lesions.  the ureteral orifices were in the normal orthotopic locations.  a 6 french ureteral catheter was then instilled into the right ureteral orifice.  a gentle retrograde was obtained and findings noted above.  we then placed a zip wire through the ureteral catheter and advanced up to the renal pelvis.    We then placed a 6 x 26 double-j ureteral stent over the original zip wire.  We then removed the wire and good coil was noted in the the renal pelvis under fluoroscopy and the bladder under direct vision.  the bladder was then drained and this concluded the procedure which was well tolerated by patient.  Complications: None  Condition: Stable, extubated, transferred to PACU  Plan: Patient is to be discharged  home and followup in 2-3 weeks for right ureteroscopic Murdy extraction

## 2024-10-23 NOTE — ED Triage Notes (Signed)
 Pt. Arrives pov c/o right flank pain. Pt. States that she has a kidney Marlin but has been unable to keep her medications down due to the vomiting.

## 2024-10-23 NOTE — ED Notes (Signed)
 Surgery is present to transport pt for procedure.

## 2024-10-23 NOTE — Transfer of Care (Signed)
 Immediate Anesthesia Transfer of Care Note  Patient: Cathy Wells  Procedure(s) Performed: CYSTOSCOPY, WITH RETROGRADE PYELOGRAM AND URETERAL STENT INSERTION (Right: Ureter)  Patient Location: PACU  Anesthesia Type:General  Level of Consciousness: awake, alert , and oriented  Airway & Oxygen Therapy: Patient Spontanous Breathing and Patient connected to face mask oxygen  Post-op Assessment: Report given to RN, Post -op Vital signs reviewed and stable, and Patient moving all extremities X 4  Post vital signs: Reviewed and stable  Last Vitals:  Vitals Value Taken Time  BP 148/88 10/23/24 15:02  Temp    Pulse 95 10/23/24 15:05  Resp 20 10/23/24 15:05  SpO2 94 % 10/23/24 15:05  Vitals shown include unfiled device data.  Last Pain:  Vitals:   10/23/24 1210  TempSrc:   PainSc: 8          Complications: No notable events documented.

## 2024-10-23 NOTE — Anesthesia Procedure Notes (Signed)
 Procedure Name: LMA Insertion Date/Time: 10/23/2024 2:33 PM  Performed by: Brandy Almarie BROCKS, CRNAPre-anesthesia Checklist: Patient identified, Emergency Drugs available, Suction available and Patient being monitored Patient Re-evaluated:Patient Re-evaluated prior to induction Oxygen Delivery Method: Circle system utilized Preoxygenation: Pre-oxygenation with 100% oxygen Induction Type: IV induction LMA: LMA inserted LMA Size: 4.0 Number of attempts: 1 Dental Injury: Teeth and Oropharynx as per pre-operative assessment

## 2024-10-23 NOTE — Progress Notes (Signed)
" ° ° °  PROCEDURAL EXPEDITER PROGRESS NOTE  Patient Name: Cathy Wells  DOB:[birth year] Date of Admission: 10/23/2024  Date of Assessment:10/23/2024   -------------------------------------------------------------------------------------------------------------------   Brief clinical summary: 33 yr old female with hx of HTN and kidney stones.  Schedule for Cysto, retrograde pyelogram and ureteral stent insertion on right side   Orders in place:  Yes   Communication with surgical team if no orders: Yes   Labs, test, and orders reviewed: Y  Requires surgical clearance:  No  What type of clearance: n/a  Clearance received: n/a   Barriers noted:Needs H&P and EKG last one was 05/2023   Intervention provided by [facility] team: reached out to Dr. Sherrilee by secure chat for H&P.  Orders put in for EKG  Barrier resolved:  no   -------------------------------------------------------------------------------------------------------------------  [facility] Expediter, Ronal DELENA Bald Please contact us  directly via secure chat (search for [facility]) or by calling us  at [phone] [facility]). "

## 2024-10-23 NOTE — Anesthesia Preprocedure Evaluation (Addendum)
"                                    Anesthesia Evaluation  Patient identified by MRN, date of birth, ID band Patient awake    Reviewed: Allergy & Precautions, H&P , NPO status , Patient's Chart, lab work & pertinent test results  History of Anesthesia Complications (+) PONV and history of anesthetic complications  Airway Mallampati: III  TM Distance: >3 FB Neck ROM: Full    Dental  (+) Dental Advisory Given, Teeth Intact   Pulmonary asthma    Pulmonary exam normal breath sounds clear to auscultation       Cardiovascular hypertension, Pt. on medications Normal cardiovascular exam Rhythm:Regular Rate:Normal     Neuro/Psych  PSYCHIATRIC DISORDERS Anxiety     negative neurological ROS     GI/Hepatic ,GERD  Medicated and Controlled,,(+)     substance abuse  marijuana use  Endo/Other    Class 3 obesity  Renal/GU negative Renal ROS     Musculoskeletal negative musculoskeletal ROS (+)    Abdominal  (+) + obese  Peds  Hematology negative hematology ROS (+)   Anesthesia Other Findings   Reproductive/Obstetrics negative OB ROS                              Anesthesia Physical Anesthesia Plan  ASA: 3  Anesthesia Plan: General   Post-op Pain Management: Tylenol  PO (pre-op)*   Induction: Intravenous  PONV Risk Score and Plan: 4 or greater and Treatment may vary due to age or medical condition, Ondansetron , Dexamethasone  and Midazolam   Airway Management Planned: LMA  Additional Equipment:   Intra-op Plan:   Post-operative Plan: Extubation in OR  Informed Consent: I have reviewed the patients History and Physical, chart, labs and discussed the procedure including the risks, benefits and alternatives for the proposed anesthesia with the patient or authorized representative who has indicated his/her understanding and acceptance.     Dental advisory given  Plan Discussed with: CRNA  Anesthesia Plan Comments:          Anesthesia Quick Evaluation  "

## 2024-10-23 NOTE — ED Notes (Signed)
 Waiting on pain medication to come down from the pharmacy

## 2024-10-23 NOTE — ED Provider Notes (Signed)
 " [place] EMERGENCY DEPARTMENT AT [facility] Provider Note  CSN: [id number] Arrival date & time: 10/23/24 0204  Chief Complaint(s) Flank Pain  History provided by patient. HPI & MDM Cathy Wells is a 33 y.o. female with a past medical history listed below including renal stones.  Patient was diagnosed with a right sided UPJ 3 mm Berrong at outside hospital 6 days ago.  Scheduled to for surgery by urology.  Presented today for significant worsening of pain with nausea vomiting and inability to tolerate p.o. and keep her medicine down.  No other physical complaints.  On review of records of outside hospital notes, patient was initially noted to have urinary tract infection that grew out E. coli pansensitive.  Patient was placed on Cipro  and has been taking the meds as prescribed.   Flank Pain   Differential diagnosis considered.  Workup below.  Likely worsening renal colic from Tiner.  Likely at the UVJ.  Will confirm with a CT scan here. CBC shows leukocytosis and mild anemia. BMP without significant electrolyte derangements or renal sufficiency. hCG negative ruling out pregnancy related process.  Patient treated symptomatically with antiemetics, IV Toradol  which did not provide much relief. IV Dilaudid  given  Clinical Course as of 10/23/24 0734  Tue Oct 23, 2024  0730 Pain not controlled with additional IV meds.  UA shows improving UTI. She still has not completed course of Cipro . Will give IV dose here.  Consulted Urology and spoke with Dr. Sherrilee who will evaluate her in the ER.  [PC]    Clinical Course User Index [PC] Tate Jerkins, Raynell Moder, MD   Medical Decision Making Amount and/or Complexity of Data Reviewed Labs: ordered. Decision-making details documented in ED Course. Radiology: ordered and independent interpretation performed. Decision-making details documented in ED Course.  Risk Prescription drug management. Parenteral controlled  substances. Decision regarding hospitalization.    Final Clinical Impression(s) / ED Diagnoses Final diagnoses:  Ureterolithiasis     Past Medical History Past Medical History:  Diagnosis Date   Anxiety    Complication of anesthesia    PONV   Complication of anesthesia    tachycardia during anesthesia   Frequency of urination    GERD (gastroesophageal reflux disease)    Hematuria    History of kidney stones    Hypertension    Left ureteral Cronkright    Mild asthma    Ovarian cyst    PONV (postoperative nausea and vomiting)    Urgency of urination    Wears contact lenses    Patient Active Problem List   Diagnosis Date Noted   Laceration of head and neck 10/11/2020   Irregular periods 12/14/2016   RLQ abdominal pain 11/30/2016   Missed periods 11/30/2016   Right ovarian cyst 11/30/2016   Urinary frequency 05/27/2015   Pelvic pain in female 05/27/2015   Lower urinary tract infectious disease 04/28/2015   Home Medication(s) Prior to Admission medications  Medication Sig Start Date End Date Taking? Authorizing Provider  acetaminophen  (TYLENOL ) 500 MG tablet Take 500-1,000 mg by mouth every 6 (six) hours as needed (pain.).    [provider]  albuterol  (PROVENTIL  HFA;VENTOLIN  HFA) 108 (90 Base) MCG/ACT inhaler Inhale 1-2 puffs into the lungs every 6 (six) hours as needed for wheezing or shortness of breath.    [provider]  ALPRAZolam (XANAX) 0.5 MG tablet Take 0.5 mg by mouth 2 (two) times daily as needed for anxiety. 10/31/19   [provider]  cetirizine CURTIS)  10 MG tablet Take 10 mg by mouth in the morning.    [provider]  citalopram (CELEXA) 20 MG tablet Take 20 mg by mouth in the morning.    [provider]  esomeprazole  (NEXIUM ) 40 MG capsule TAKE 1 CAPSULE BY MOUTH ONCE DAILY AT NOON 05/25/24   Kennedy Charmaine CROME, NP  olmesartan (BENICAR) 40 MG tablet Take 40 mg by mouth in the morning. 02/04/23   [provider]  omeprazole  (PRILOSEC) 40 MG capsule Take 1 capsule (40 mg total) by mouth 2 (two) times daily before a meal. 04/19/23   Kennedy Charmaine CROME, NP  ondansetron  (ZOFRAN ) 4 MG tablet Take 1 tablet (4 mg total) by mouth every 6 (six) hours as needed for nausea or vomiting. 04/14/23   Prosperi, Christian H, PA-C  sucralfate  (CARAFATE ) 1 g tablet Take 1 tablet (1 g total) by mouth 4 (four) times daily -  with meals and at bedtime. 04/14/23   Prosperi, Christian H, PA-C                                                                                                                                    Allergies Doxycycline , Amoxapine and related, Azithromycin, Clarithromycin, Erythromycin, Penicillins, and Sulfa antibiotics  Review of Systems Review of Systems  Genitourinary:  Positive for flank pain.   As noted in HPI  Physical Exam Vital Signs  I have reviewed the triage vital signs BP (!) 165/100 (BP Location: Right Arm)   Pulse 95   Temp 98.2 F (36.8 C) (Oral)   Resp 16   SpO2 100%   Physical Exam Vitals reviewed.  Constitutional:      General: She is not in acute distress.    Appearance: She is well-developed. She is obese. She is not diaphoretic.  HENT:     Head: Normocephalic and atraumatic.     Right Ear: External ear normal.     Left Ear: External ear normal.     Nose: Nose normal.  Eyes:     General: No scleral icterus.    Conjunctiva/sclera: Conjunctivae normal.  Neck:     Trachea: Phonation normal.  Cardiovascular:     Rate and Rhythm: Normal rate and regular rhythm.  Pulmonary:     Effort: Pulmonary effort is normal. No respiratory distress.     Breath sounds: No stridor.  Abdominal:     General: There is no distension.     Tenderness: There is right CVA tenderness.  Musculoskeletal:        General: Normal range of motion.     Cervical back: Normal range of motion.  Neurological:     Mental Status: She is alert and oriented to person, place, and time.   Psychiatric:        Behavior: Behavior normal.     ED Results and Treatments Labs (all labs ordered are listed, but only abnormal results are displayed)  Labs Reviewed  URINALYSIS, ROUTINE W REFLEX MICROSCOPIC - Abnormal; Notable for the following components:      Result Value   APPearance HAZY (*)    Hgb urine dipstick MODERATE (*)    Leukocytes,Ua TRACE (*)    Bacteria, UA RARE (*)    All other components within normal limits  CBC - Abnormal; Notable for the following components:   WBC 11.9 (*)    Hemoglobin 11.8 (*)    HCT 35.7 (*)    All other components within normal limits  HCG, SERUM, QUALITATIVE  BASIC METABOLIC PANEL WITH GFR                                                                                                                         EKG  EKG Interpretation Date/Time:    Ventricular Rate:    PR Interval:    QRS Duration:    QT Interval:    QTC Calculation:   R Axis:      Text Interpretation:         Radiology CT Renal Daoust Study Result Date: 10/23/2024 CLINICAL DATA:  Abdominal and flank pain. EXAM: CT ABDOMEN AND PELVIS WITHOUT CONTRAST TECHNIQUE: Multidetector CT imaging of the abdomen and pelvis was performed following the standard protocol without IV contrast. RADIATION DOSE REDUCTION: This exam was performed according to the departmental dose-optimization program which includes automated exposure control, adjustment of the mA and/or kV according to patient size and/or use of iterative reconstruction technique. COMPARISON:  09/08/201 FINDINGS: Lower chest: No acute findings. Hepatobiliary: No suspicious focal abnormality in the liver on this study without intravenous contrast. There is no evidence for gallstones, gallbladder wall thickening, or pericholecystic fluid. No intrahepatic or extrahepatic biliary dilation. Pancreas: No focal mass lesion. No dilatation of the main duct. No intraparenchymal cyst. No peripancreatic edema. Spleen: No  splenomegaly. No suspicious focal mass lesion. Adrenals/Urinary Tract: No adrenal nodule or mass. 2 mm nonobstructing stones are seen in the interpolar and lower pole regions of the left kidney. Left ureter unremarkable. Mild right hydroureteronephrosis evident with 2 mm Hamberger in the distal right ureter about 1 cm proximal to the UVJ (see axial 80/2). Bladder is decompressed. Stomach/Bowel: Stomach is unremarkable. No gastric wall thickening. No evidence of outlet obstruction. Duodenum is normally positioned as is the ligament of Treitz. No small bowel wall thickening. No small bowel dilatation. The terminal ileum is normal. Nonvisualization of the appendix is consistent with the reported history of appendectomy. No gross colonic mass. No colonic wall thickening. Vascular/Lymphatic: No abdominal aortic aneurysm. No abdominal aortic atherosclerotic calcification. There is no gastrohepatic or hepatoduodenal ligament lymphadenopathy. No retroperitoneal or mesenteric lymphadenopathy. No pelvic sidewall lymphadenopathy. Reproductive: Unremarkable. Other: No intraperitoneal free fluid. Musculoskeletal: No worrisome lytic or sclerotic osseous abnormality. IMPRESSION: 1. 2 mm distal right ureteral Eggebrecht with mild right hydroureteronephrosis. 2. 2 mm nonobstructing stones in the interpolar and lower pole regions of the left kidney. Electronically Signed   By: Camellia Candle  M.D.   On: 10/23/2024 05:41    Medications Ordered in ED Medications  HYDROmorphone  (DILAUDID ) injection 0.5 mg (has no administration in time range)  ketorolac  (TORADOL ) 15 MG/ML injection 15 mg (has no administration in time range)  ciprofloxacin  (CIPRO ) IVPB 400 mg (has no administration in time range)  prochlorperazine  (COMPAZINE ) injection 10 mg (10 mg Intravenous Given 10/23/24 0338)  sodium chloride  0.9 % bolus 1,000 mL (0 mLs Intravenous Stopped 10/23/24 0725)  ketorolac  (TORADOL ) 15 MG/ML injection 15 mg (15 mg Intravenous Given 10/23/24  0338)  HYDROmorphone  (DILAUDID ) injection 1 mg (1 mg Intravenous Given 10/23/24 0402)  tamsulosin  (FLOMAX ) capsule 0.4 mg (0.4 mg Oral Given 10/23/24 9386)  hyoscyamine  (ANASPAZ ) disintergrating tablet 0.25 mg (0.25 mg Oral Given 10/23/24 0612)  HYDROmorphone  (DILAUDID ) injection 0.5 mg (0.5 mg Intravenous Given 10/23/24 9380)   Procedures Procedures  (including critical care time)   This chart was dictated using voice recognition software.  Despite best efforts to proofread,  errors can occur which can change the documentation meaning.   Trine Raynell Moder, MD 10/23/24 [phone]  "

## 2024-10-23 NOTE — ED Provider Notes (Signed)
 Received signout.  See prior team's note for full HPI.  Signed out pending urology evaluation.  Urology has evaluated patient and is planning to put in stent at 4 PM.  Discharge afterwards.   Neysa Caron PARAS, DO 10/23/24 1247

## 2024-10-23 NOTE — ED Notes (Signed)
 Elevated bp reported to [employer]

## 2024-10-23 NOTE — Consult Note (Signed)
 Urology Consult  Referring physician: Dr. Neysa Reason for referral: ureteral calculus, intractable pain  Chief Complaint: right flank pain  History of Present Illness: Cathy Wells is a 33yo with a history of nephrolithiasis who presented to the ER with a 10 day history of worsening right flank pain. She was initially seen at [facility] 12/17 and diagnosed with a 2mm right ureteral calculus. She was discharged on medical expulsive therapy but her right flank pain continued to worsen prompting her to come to [facility] ER. Repeat Ct shows a 2mm right distal ureteral calculus with mild hydronephrosis. She has had numerous medication to try to control her pain which have no been successful. She was treated for a UTI 1 week ago. She denies nay fevers or chills. She has nausea but no vomiting.   Past Medical History:  Diagnosis Date   Anxiety    Complication of anesthesia    PONV   Complication of anesthesia    tachycardia during anesthesia   Frequency of urination    GERD (gastroesophageal reflux disease)    Hematuria    History of kidney stones    Hypertension    Left ureteral [place]    Mild asthma    Ovarian cyst    PONV (postoperative nausea and vomiting)    Urgency of urination    Wears contact lenses    Past Surgical History:  Procedure Laterality Date   APPENDECTOMY  07/25/2006   OPEN   BIOPSY  05/16/2023   Procedure: BIOPSY;  Surgeon: Cindie Carlin POUR, DO;  Location: [facility];  Service: Endoscopy;;   ESOPHAGOGASTRODUODENOSCOPY (EGD) WITH PROPOFOL  N/A 05/16/2023   Procedure: ESOPHAGOGASTRODUODENOSCOPY (EGD) WITH PROPOFOL ;  Surgeon: Cindie Carlin POUR, DO;  Location: [facility];  Service: Endoscopy;  Laterality: N/A;  1:15 pm, asa 3   URETEROSCOPY WITH HOLMIUM LASER LITHOTRIPSY Left 07/21/2017   Procedure: retrogrades, URETEROSCOPY WITH Kempa basketry, stent placement;  Surgeon: Cam Morene ORN, MD;  Location: [facility];  Service: Urology;  Laterality: Left;    wisdom teeth removal      Medications: I have reviewed the patient's current medications. Allergies: Allergies[1]  Family History  Problem Relation Age of Onset   Hypertension Mother    Asthma Father    Hypertension Father    Hypertension Maternal Grandfather    Diabetes Paternal Grandmother    Hypertension Paternal Grandmother    COPD Paternal Grandfather    Social History:  reports that she has never smoked. She has never used smokeless tobacco. She reports current drug use. Drug: Marijuana. She reports that she does not drink alcohol.  Review of Systems  Genitourinary:  Positive for flank pain.  All other systems reviewed and are negative.   Physical Exam:  Vital signs in last 24 hours: Temp:  [97.6 F (36.4 C)-98.2 F (36.8 C)] 98.1 F (36.7 C) (12/23 0838) Pulse Rate:  [82-95] 82 (12/23 0838) Resp:  [16-20] 16 (12/23 0838) BP: (145-165)/(88-108) 145/88 (12/23 0838) SpO2:  [100 %] 100 % (12/23 9161) Physical Exam Vitals and nursing note reviewed.  Constitutional:      Appearance: Normal appearance.  HENT:     Head: Normocephalic and atraumatic.     Nose: Nose normal.     Mouth/Throat:     Mouth: Mucous membranes are dry.  Eyes:     Extraocular Movements: Extraocular movements intact.     Pupils: Pupils are equal, round, and reactive to light.  Cardiovascular:     Rate and Rhythm:  Normal rate and regular rhythm.  Pulmonary:     Effort: Pulmonary effort is normal. No respiratory distress.  Abdominal:     General: Abdomen is flat.     Palpations: Abdomen is soft.  Musculoskeletal:        General: No swelling. Normal range of motion.     Cervical back: Normal range of motion and neck supple.  Skin:    General: Skin is warm and dry.  Neurological:     General: No focal deficit present.     Mental Status: She is alert and oriented to person, place, and time.  Psychiatric:        Mood and Affect: Mood normal.        Behavior: Behavior normal.        Thought  Content: Thought content normal.        Judgment: Judgment normal.     Laboratory Data:  Results for orders placed or performed during the hospital encounter of 10/23/24 (from the past 72 hours)  Urinalysis, Routine w reflex microscopic -Urine, Clean Catch     Status: Abnormal   Collection Time: 10/23/24  2:28 AM  Result Value Ref Range   Color, Urine YELLOW YELLOW   APPearance HAZY (A) CLEAR   Specific Gravity, Urine 1.010 1.005 - 1.030   pH 6.0 5.0 - 8.0   Glucose, UA NEGATIVE NEGATIVE mg/dL   Hgb urine dipstick MODERATE (A) NEGATIVE   Bilirubin Urine NEGATIVE NEGATIVE   Ketones, ur NEGATIVE NEGATIVE mg/dL   Protein, ur NEGATIVE NEGATIVE mg/dL   Nitrite NEGATIVE NEGATIVE   Leukocytes,Ua TRACE (A) NEGATIVE   RBC / HPF 11-20 0 - 5 RBC/hpf   WBC, UA 6-10 0 - 5 WBC/hpf   Bacteria, UA RARE (A) NONE SEEN   Squamous Epithelial / HPF 6-10 0 - 5 /HPF    Comment: Performed at [facility], [address], [place], [place] [address]  hCG, serum, qualitative     Status: None   Collection Time: 10/23/24  3:05 AM  Result Value Ref Range   Preg, Serum NEGATIVE NEGATIVE    Comment:        THE SENSITIVITY OF THIS METHODOLOGY IS >10 mIU/mL. Performed at [facility], [address] [place], [place] [address]   CBC     Status: Abnormal   Collection Time: 10/23/24  3:05 AM  Result Value Ref Range   WBC 11.9 (H) 4.0 - 10.5 K/uL   RBC 4.26 3.87 - 5.11 MIL/uL   Hemoglobin 11.8 (L) 12.0 - 15.0 g/dL   HCT 64.2 (L) 63.9 - 53.9 %   MCV 83.8 80.0 - 100.0 fL   MCH 27.7 26.0 - 34.0 pg   MCHC 33.1 30.0 - 36.0 g/dL   RDW 87.7 88.4 - 84.4 %   Platelets 370 150 - 400 K/uL   nRBC 0.0 0.0 - 0.2 %    Comment: Performed at [facility], [address], [place], [place] [address]  Basic metabolic panel     Status: None   Collection Time: 10/23/24  3:05 AM  Result Value Ref Range   Sodium 136 135 - 145 mmol/L   Potassium 3.9 3.5 - 5.1 mmol/L     Comment: HEMOLYSIS AT THIS LEVEL MAY AFFECT RESULT   Chloride 99 98 - 111 mmol/L   CO2 23 22 - 32 mmol/L   Glucose, Bld 98 70 - 99 mg/dL    Comment: Glucose reference range applies only  to samples taken after fasting for at least 8 hours.   BUN 10 6 - 20 mg/dL   Creatinine, Ser 9.06 0.44 - 1.00 mg/dL   Calcium 9.5 8.9 - 10.3 mg/dL   GFR, Estimated >39 >39 mL/min    Comment: (NOTE) Calculated using the CKD-EPI Creatinine Equation (2021)    Anion gap 14 5 - 15    Comment: Performed at [facility], [address] [place], [place] [address]   No results found for this or any previous visit (from the past 240 hours). Creatinine: Recent Labs    10/23/24 0305  CREATININE 0.93   Baseline Creatinine: 0.9  Impression/Assessment:  33yo with right ureteral calculus  Plan:  -We discussed the management of kidney stones. These options include observation, ureteroscopy, shockwave lithotripsy (ESWL) and percutaneous nephrolithotomy (PCNL). We discussed which options are relevant to the patient's Vita(s). We discussed the natural history of kidney stones as well as the complications of untreated stones and the impact on quality of life without treatment as well as with each of the above listed treatments. We also discussed the efficacy of each treatment in its ability to clear the [place] burden. With any of these management options I discussed the signs and symptoms of infection and the need for emergent treatment should these be experienced. For each option we discussed the ability of each procedure to clear the patient of their Hillary burden.   For observation I described the risks which include but are not limited to silent renal damage, life-threatening infection, need for emergent surgery, failure to pass Henk and pain.   For ureteroscopy I described the risks which include bleeding, infection, damage to contiguous structures, positioning injury, ureteral stricture, ureteral  avulsion, ureteral injury, need for prolonged ureteral stent, inability to perform ureteroscopy, need for an interval procedure, inability to clear Eager burden, stent discomfort/pain, heart attack, stroke, pulmonary embolus and the inherent risks with general anesthesia.   For shockwave lithotripsy I described the risks which include arrhythmia, kidney contusion, kidney hemorrhage, need for transfusion, pain, inability to adequately break up [place], inability to pass Baskins fragments, Steinstrasse, infection associated with obstructing stones, need for alternate surgical procedure, need for repeat shockwave lithotripsy, MI, CVA, PE and the inherent risks with anesthesia/conscious sedation.   For PCNL I described the risks including positioning injury, pneumothorax, hydrothorax, need for chest tube, inability to clear Grahn burden, renal laceration, arterial venous fistula or malformation, need for embolization of kidney, loss of kidney or renal function, need for repeat procedure, need for prolonged nephrostomy tube, ureteral avulsion, MI, CVA, PE and the inherent risks of general anesthesia.   - The patient would like to proceed with rigth ureteral stent placement  Cathy Wells 10/23/2024, 9:45 AM         [1]  Allergies Allergen Reactions   Doxycycline  Nausea And Vomiting   Amoxapine And Related Hives and Nausea And Vomiting   Azithromycin Other (See Comments)    MAKES ME SICK   Clarithromycin Nausea And Vomiting   Erythromycin Other (See Comments)    SICK ON STOMACH   Penicillins Hives    .Has patient had a PCN reaction causing immediate rash, facial/tongue/throat swelling, SOB or lightheadedness with hypotension: Unknown Has patient had a PCN reaction causing severe rash involving mucus membranes or skin necrosis: Unknown Has patient had a PCN reaction that required hospitalization: Unknown Has patient had a PCN reaction occurring within the last 10 years: No If all of the  above answers  are NO, then may proceed with Cephalosporin use.    Sulfa Antibiotics Hives

## 2024-10-24 ENCOUNTER — Encounter (HOSPITAL_COMMUNITY): Payer: Self-pay | Admitting: Urology

## 2024-10-24 NOTE — Anesthesia Postprocedure Evaluation (Signed)
"   Anesthesia Post Note  Patient: Cathy Wells  Procedure(s) Performed: CYSTOSCOPY, WITH RETROGRADE PYELOGRAM AND URETERAL STENT INSERTION (Right: Ureter)     Patient location during evaluation: PACU Anesthesia Type: General Level of consciousness: sedated and patient cooperative Pain management: pain level controlled Vital Signs Assessment: post-procedure vital signs reviewed and stable Respiratory status: spontaneous breathing Cardiovascular status: stable Anesthetic complications: no   No notable events documented.  Last Vitals:  Vitals:   10/23/24 1530 10/23/24 1555  BP: (!) 142/99 (!) 144/103  Pulse: 99 87  Resp: 19 20  Temp:  37.1 C  SpO2: 100% 100%    Last Pain:  Vitals:   10/23/24 1603  TempSrc:   PainSc: 7                  Abbagayle Zaragoza      "

## 2024-10-29 ENCOUNTER — Telehealth: Payer: Self-pay | Admitting: Urology

## 2024-10-29 NOTE — Telephone Encounter (Signed)
 Patient had stent placed in hospital on 12/23 by Dr Sherrilee and was told to be seen within a week for follow up. Please call [phone]

## 2024-10-29 NOTE — Telephone Encounter (Signed)
 Return call to patient. Pt was advised she has an upcoming appointment for stent removal. Pt voiced understanding

## 2024-10-31 ENCOUNTER — Ambulatory Visit (HOSPITAL_COMMUNITY)
Admission: RE | Admit: 2024-10-31 | Discharge: 2024-10-31 | Disposition: A | Discharge status: Home / Self Care | Source: Ambulatory Visit | Attending: Urology | Admitting: Urology

## 2024-10-31 ENCOUNTER — Ambulatory Visit: Payer: Self-pay | Admitting: Urology

## 2024-10-31 ENCOUNTER — Other Ambulatory Visit: Payer: Self-pay

## 2024-10-31 VITALS — BP 139/91 | HR 75

## 2024-10-31 DIAGNOSIS — N201 Calculus of ureter: Secondary | ICD-10-CM | POA: Insufficient documentation

## 2024-10-31 LAB — URINALYSIS, ROUTINE W REFLEX MICROSCOPIC
Bilirubin, UA: NEGATIVE
Glucose, UA: NEGATIVE
Ketones, UA: NEGATIVE
Nitrite, UA: NEGATIVE
Specific Gravity, UA: 1.02 (ref 1.005–1.030)
Urobilinogen, Ur: 0.2 mg/dL (ref 0.2–1.0)
pH, UA: 6.5 (ref 5.0–7.5)

## 2024-10-31 LAB — MICROSCOPIC EXAMINATION: RBC, Urine: >30 /HPF — AB (ref 0–2)

## 2024-10-31 MED ORDER — TAMSULOSIN HCL 0.4 MG PO CAPS: 0.4000 mg | ORAL_CAPSULE | Freq: Every day | ORAL | 0 refills | Status: DC

## 2024-10-31 MED ORDER — OXYCODONE-ACETAMINOPHEN 5-325 MG PO TABS: 1.0000 | ORAL_TABLET | ORAL | 0 refills | Status: DC | PRN

## 2024-10-31 NOTE — H&P (View-Only) (Signed)
 "  10/31/2024 9:07 AM   Cathy Wells [birth year] [id number]  Referring provider: Marvine Rush, MD [address] [place],  [place] [address]  Followup nephrolithiasis   HPI: Cathy Wells is a 33yo here for followup for nephrolithiasis. KUB shows right mid ureteral calculus. She denies any fevers. She has mild flank pain with the stent.    PMH: Past Medical History:  Diagnosis Date   Anxiety    Complication of anesthesia    PONV   Complication of anesthesia    tachycardia during anesthesia   Frequency of urination    GERD (gastroesophageal reflux disease)    Hematuria    History of kidney stones    Hypertension    Left ureteral [place]    Mild asthma    Ovarian cyst    PONV (postoperative nausea and vomiting)    Urgency of urination    Wears contact lenses     Surgical History: Past Surgical History:  Procedure Laterality Date   APPENDECTOMY  07/25/2006   OPEN   BIOPSY  05/16/2023   Procedure: BIOPSY;  Surgeon: Cindie Carlin POUR, DO;  Location: [facility];  Service: Endoscopy;;   CYSTOSCOPY W/ URETERAL STENT PLACEMENT Right 10/23/2024   Procedure: CYSTOSCOPY, WITH RETROGRADE PYELOGRAM AND URETERAL STENT INSERTION;  Surgeon: Sherrilee Belvie LITTIE, MD;  Location: [facility];  Service: Urology;  Laterality: Right;   ESOPHAGOGASTRODUODENOSCOPY (EGD) WITH PROPOFOL  N/A 05/16/2023   Procedure: ESOPHAGOGASTRODUODENOSCOPY (EGD) WITH PROPOFOL ;  Surgeon: Cindie Carlin POUR, DO;  Location: [facility];  Service: Endoscopy;  Laterality: N/A;  1:15 pm, asa 3   URETEROSCOPY WITH HOLMIUM LASER LITHOTRIPSY Left 07/21/2017   Procedure: retrogrades, URETEROSCOPY WITH [employer] basketry, stent placement;  Surgeon: Cam Morene ORN, MD;  Location: [facility];  Service: Urology;  Laterality: Left;   wisdom teeth removal      Home Medications:  Allergies as of 10/31/2024       Reactions   Doxycycline  Nausea And Vomiting   Amoxapine And Related Hives, Nausea And Vomiting    Azithromycin Other (See Comments)   MAKES ME SICK   Clarithromycin Nausea And Vomiting   Erythromycin Other (See Comments)   SICK ON STOMACH   Penicillins Hives   .Has patient had a PCN reaction causing immediate rash, facial/tongue/throat swelling, SOB or lightheadedness with hypotension: Unknown Has patient had a PCN reaction causing severe rash involving mucus membranes or skin necrosis: Unknown Has patient had a PCN reaction that required hospitalization: Unknown Has patient had a PCN reaction occurring within the last 10 years: No If all of the above answers are NO, then may proceed with Cephalosporin use.   Sulfa Antibiotics Hives        Medication List        Accurate as of October 31, 2024  9:07 AM. If you have any questions, ask your nurse or doctor.          acetaminophen  500 MG tablet Commonly known as: TYLENOL  Take 500-1,000 mg by mouth every 6 (six) hours as needed (pain.).   albuterol  108 (90 Base) MCG/ACT inhaler Commonly known as: VENTOLIN  HFA Inhale 1-2 puffs into the lungs every 6 (six) hours as needed for wheezing or shortness of breath.   ALPRAZolam 0.5 MG tablet Commonly known as: XANAX Take 0.5 mg by mouth 2 (two) times daily as needed for anxiety.   cetirizine 10 MG tablet Commonly known as: ZYRTEC Take 10 mg by mouth in the morning.  citalopram 20 MG tablet Commonly known as: CELEXA Take 20 mg by mouth in the morning.   esomeprazole  40 MG capsule Commonly known as: NEXIUM  TAKE 1 CAPSULE BY MOUTH ONCE DAILY AT NOON   olmesartan 40 MG tablet Commonly known as: BENICAR Take 40 mg by mouth in the morning.   omeprazole  40 MG capsule Commonly known as: PRILOSEC Take 1 capsule (40 mg total) by mouth 2 (two) times daily before a meal.   ondansetron  4 MG tablet Commonly known as: Zofran  Take 1 tablet (4 mg total) by mouth every 6 (six) hours as needed for nausea or vomiting.   oxyCODONE -acetaminophen  5-325 MG tablet Commonly known  as: Percocet Take 1 tablet by mouth every 4 (four) hours as needed for severe pain (pain score 7-10).   sucralfate  1 g tablet Commonly known as: Carafate  Take 1 tablet (1 g total) by mouth 4 (four) times daily -  with meals and at bedtime.   tamsulosin  0.4 MG Caps capsule Commonly known as: FLOMAX  Take 1 capsule (0.4 mg total) by mouth daily after supper.        Allergies: Allergies[1]  Family History: Family History  Problem Relation Age of Onset   Hypertension Mother    Asthma Father    Hypertension Father    Hypertension Maternal Grandfather    Diabetes Paternal Grandmother    Hypertension Paternal Grandmother    COPD Paternal Grandfather     Social History:  reports that she has never smoked. She has never used smokeless tobacco. She reports current drug use. Drug: Marijuana. She reports that she does not drink alcohol.  ROS: All other review of systems were reviewed and are negative except what is noted above in HPI  Physical Exam: BP (!) 139/91   Pulse 75   Constitutional:  Alert and oriented, No acute distress. HEENT: [place] AT, moist mucus membranes.  Trachea midline, no masses. Cardiovascular: No clubbing, cyanosis, or edema. Respiratory: Normal respiratory effort, no increased work of breathing. GI: Abdomen is soft, nontender, nondistended, no abdominal masses GU: No CVA tenderness.  Lymph: No cervical or inguinal lymphadenopathy. Skin: No rashes, bruises or suspicious lesions. Neurologic: Grossly intact, no focal deficits, moving all 4 extremities. Psychiatric: Normal mood and affect.  Laboratory Data: Lab Results  Component Value Date   WBC 11.9 (H) 10/23/2024   HGB 11.8 (L) 10/23/2024   HCT 35.7 (L) 10/23/2024   MCV 83.8 10/23/2024   PLT 370 10/23/2024    Lab Results  Component Value Date   CREATININE 0.93 10/23/2024    No results found for: PSA  No results found for: TESTOSTERONE  No results found for: HGBA1C  Urinalysis     Component Value Date/Time   COLORURINE YELLOW 10/23/2024 0228   APPEARANCEUR HAZY (A) 10/23/2024 0228   APPEARANCEUR Cloudy (A) 04/28/2015 1500   LABSPEC 1.010 10/23/2024 0228   PHURINE 6.0 10/23/2024 0228   GLUCOSEU NEGATIVE 10/23/2024 0228   HGBUR MODERATE (A) 10/23/2024 0228   BILIRUBINUR NEGATIVE 10/23/2024 0228   BILIRUBINUR negative 04/22/2021 1300   BILIRUBINUR Negative 04/28/2015 1500   KETONESUR NEGATIVE 10/23/2024 0228   PROTEINUR NEGATIVE 10/23/2024 0228   UROBILINOGEN 0.2 04/22/2021 1300   UROBILINOGEN 0.2 06/09/2015 1920   NITRITE NEGATIVE 10/23/2024 0228   LEUKOCYTESUR TRACE (A) 10/23/2024 0228    Lab Results  Component Value Date   LABMICR See below: 04/28/2015   WBCUA 6-10 (A) 04/28/2015   RBCUA 0-2 04/28/2015   LABEPIT >10 (A) 04/28/2015   MUCUS Present  04/28/2015   BACTERIA RARE (A) 10/23/2024    Pertinent Imaging: KUB today: Images reviewed and discussed with the patient  No results found for this or any previous visit.  No results found for this or any previous visit.  No results found for this or any previous visit.  No results found for this or any previous visit.  Results for orders placed during the hospital encounter of 05/29/24  US  RENAL  Narrative CLINICAL DATA:  Renal and ureteral calculus, left flank pain for 1-2 weeks  EXAM: RENAL / URINARY TRACT ULTRASOUND COMPLETE  COMPARISON:  07/09/2018  FINDINGS: Right Kidney:  Renal measurements: 11.4 x 5.2 x 5.5 cm = volume: 171.6 mL. Echogenicity within normal limits. No mass or hydronephrosis visualized.  Left Kidney:  Renal measurements: 10.5 x 5.0 x 5.4 cm = volume: 145.5 mL. Echogenicity within normal limits. No mass or hydronephrosis visualized.  Bladder:  Bladder is decompressed, limiting its evaluation.  Other:  None.  IMPRESSION: 1. Unremarkable renal ultrasound. No urinary tract calculi or obstructive uropathy.   Electronically Signed By: Ozell Daring  M.D. On: 05/29/2024 12:48  No results found for this or any previous visit.  No results found for this or any previous visit.  Results for orders placed during the hospital encounter of 10/23/24  CT Renal Coopersmith Study  Narrative CLINICAL DATA:  Abdominal and flank pain.  EXAM: CT ABDOMEN AND PELVIS WITHOUT CONTRAST  TECHNIQUE: Multidetector CT imaging of the abdomen and pelvis was performed following the standard protocol without IV contrast.  RADIATION DOSE REDUCTION: This exam was performed according to the departmental dose-optimization program which includes automated exposure control, adjustment of the mA and/or kV according to patient size and/or use of iterative reconstruction technique.  COMPARISON:  09/08/201  FINDINGS: Lower chest: No acute findings.  Hepatobiliary: No suspicious focal abnormality in the liver on this study without intravenous contrast. There is no evidence for gallstones, gallbladder wall thickening, or pericholecystic fluid. No intrahepatic or extrahepatic biliary dilation.  Pancreas: No focal mass lesion. No dilatation of the main duct. No intraparenchymal cyst. No peripancreatic edema.  Spleen: No splenomegaly. No suspicious focal mass lesion.  Adrenals/Urinary Tract: No adrenal nodule or mass. 2 mm nonobstructing stones are seen in the interpolar and lower pole regions of the left kidney. Left ureter unremarkable. Mild right hydroureteronephrosis evident with 2 mm Kincy in the distal right ureter about 1 cm proximal to the UVJ (see axial 80/2). Bladder is decompressed.  Stomach/Bowel: Stomach is unremarkable. No gastric wall thickening. No evidence of outlet obstruction. Duodenum is normally positioned as is the ligament of Treitz. No small bowel wall thickening. No small bowel dilatation. The terminal ileum is normal. Nonvisualization of the appendix is consistent with the reported history of appendectomy. No gross colonic mass. No  colonic wall thickening.  Vascular/Lymphatic: No abdominal aortic aneurysm. No abdominal aortic atherosclerotic calcification. There is no gastrohepatic or hepatoduodenal ligament lymphadenopathy. No retroperitoneal or mesenteric lymphadenopathy. No pelvic sidewall lymphadenopathy.  Reproductive: Unremarkable.  Other: No intraperitoneal free fluid.  Musculoskeletal: No worrisome lytic or sclerotic osseous abnormality.  IMPRESSION: 1. 2 mm distal right ureteral [place] with mild right hydroureteronephrosis. 2. 2 mm nonobstructing stones in the interpolar and lower pole regions of the left kidney.   Electronically Signed By: Camellia Candle M.D. On: 10/23/2024 05:41   Assessment & Plan:    1. Ureteral Czerwinski (Primary) -We discussed the management of kidney stones. These options include observation, ureteroscopy, shockwave lithotripsy (ESWL) and percutaneous nephrolithotomy (  PCNL). We discussed which options are relevant to the patient's Rudin(s). We discussed the natural history of kidney stones as well as the complications of untreated stones and the impact on quality of life without treatment as well as with each of the above listed treatments. We also discussed the efficacy of each treatment in its ability to clear the [place] burden. With any of these management options I discussed the signs and symptoms of infection and the need for emergent treatment should these be experienced. For each option we discussed the ability of each procedure to clear the patient of their Ac[place]ella burden.   For observation I described the risks which include but are not limited to silent renal damage, life-threatening infection, need for emergent surgery, failure to pass Sanzo and pain.   For ureteroscopy I described the risks which include bleeding, infection, damage to contiguous structures, positioning injury, ureteral stricture, ureteral avulsion, ureteral injury, need for prolonged ureteral stent,  inability to perform ureteroscopy, need for an interval procedure, inability to clear Postel burden, stent discomfort/pain, heart attack, stroke, pulmonary embolus and the inherent risks with general anesthesia.   For shockwave lithotripsy I described the risks which include arrhythmia, kidney contusion, kidney hemorrhage, need for transfusion, pain, inability to adequately break up Farfan, inability to pass Birchard fragments, Steinstrasse, infection associated with obstructing stones, need for alternate surgical procedure, need for repeat shockwave lithotripsy, MI, CVA, PE and the inherent risks with anesthesia/conscious sedation.   For PCNL I described the risks including positioning injury, pneumothorax, hydrothorax, need for chest tube, inability to clear Lierman burden, renal laceration, arterial venous fistula or malformation, need for embolization of kidney, loss of kidney or renal function, need for repeat procedure, need for prolonged nephrostomy tube, ureteral avulsion, MI, CVA, PE and the inherent risks of general anesthesia.   - The patient would like to proceed with right ESWL - Urinalysis, Routine w reflex microscopic   No follow-ups on file.  [name], MD  [facility]       [1]  Allergies Allergen Reactions   Doxycycline  Nausea And Vomiting   Amoxapine And Related Hives and Nausea And Vomiting   Azithromycin Other (See Comments)    MAKES ME SICK   Clarithromycin Nausea And Vomiting   Erythromycin Other (See Comments)    SICK ON STOMACH   Penicillins Hives    .Has patient had a PCN reaction causing immediate rash, facial/tongue/throat swelling, SOB or lightheadedness with hypotension: Unknown Has patient had a PCN reaction causing severe rash involving mucus membranes or skin necrosis: Unknown Has patient had a PCN reaction that required hospitalization: Unknown Has patient had a PCN reaction occurring within the last 10 years: No If all of the  above answers are NO, then may proceed with Cephalosporin use.    Sulfa Antibiotics Hives   "

## 2024-10-31 NOTE — Progress Notes (Signed)
 "  10/31/2024 9:07 AM   Cathy Wells [birth year] [id number]  Referring provider: Marvine Wells, Cathy Wells [address] [place],  [place] [address]  Followup nephrolithiasis   HPI: Cathy Wells is a 33yo here for followup for nephrolithiasis. KUB shows right mid ureteral calculus. She denies any fevers. She has mild flank pain with the stent.    PMH: Past Medical History:  Diagnosis Date   Anxiety    Complication of anesthesia    PONV   Complication of anesthesia    tachycardia during anesthesia   Frequency of urination    GERD (gastroesophageal reflux disease)    Hematuria    History of kidney stones    Hypertension    Left ureteral [place]    Mild asthma    Ovarian cyst    PONV (postoperative nausea and vomiting)    Urgency of urination    Wears contact lenses     Surgical History: Past Surgical History:  Procedure Laterality Date   APPENDECTOMY  07/25/2006   OPEN   BIOPSY  05/16/2023   Procedure: BIOPSY;  Surgeon: Cindie Carlin POUR, DO;  Location: [facility];  Service: Endoscopy;;   CYSTOSCOPY W/ URETERAL STENT PLACEMENT Right 10/23/2024   Procedure: CYSTOSCOPY, WITH RETROGRADE PYELOGRAM AND URETERAL STENT INSERTION;  Surgeon: Cathy Wells, Cathy Wells;  Location: [facility];  Service: Urology;  Laterality: Right;   ESOPHAGOGASTRODUODENOSCOPY (EGD) WITH PROPOFOL  N/A 05/16/2023   Procedure: ESOPHAGOGASTRODUODENOSCOPY (EGD) WITH PROPOFOL ;  Surgeon: Cindie Carlin POUR, DO;  Location: [facility];  Service: Endoscopy;  Laterality: N/A;  1:15 pm, asa 3   URETEROSCOPY WITH HOLMIUM LASER LITHOTRIPSY Left 07/21/2017   Procedure: retrogrades, URETEROSCOPY WITH [employer] basketry, stent placement;  Surgeon: Cam Morene ORN, Cathy Wells;  Location: [facility];  Service: Urology;  Laterality: Left;   wisdom teeth removal      Home Medications:  Allergies as of 10/31/2024       Reactions   Doxycycline  Nausea And Vomiting   Amoxapine And Related Hives, Nausea And Vomiting    Azithromycin Other (See Comments)   MAKES ME SICK   Clarithromycin Nausea And Vomiting   Erythromycin Other (See Comments)   SICK ON STOMACH   Penicillins Hives   .Has patient had a PCN reaction causing immediate rash, facial/tongue/throat swelling, SOB or lightheadedness with hypotension: Unknown Has patient had a PCN reaction causing severe rash involving mucus membranes or skin necrosis: Unknown Has patient had a PCN reaction that required hospitalization: Unknown Has patient had a PCN reaction occurring within the last 10 years: No If all of the above answers are NO, then may proceed with Cephalosporin use.   Sulfa Antibiotics Hives        Medication List        Accurate as of October 31, 2024  9:07 AM. If you have any questions, ask your nurse or doctor.          acetaminophen  500 MG tablet Commonly known as: TYLENOL  Take 500-1,000 mg by mouth every 6 (six) hours as needed (pain.).   albuterol  108 (90 Base) MCG/ACT inhaler Commonly known as: VENTOLIN  HFA Inhale 1-2 puffs into the lungs every 6 (six) hours as needed for wheezing or shortness of breath.   ALPRAZolam 0.5 MG tablet Commonly known as: XANAX Take 0.5 mg by mouth 2 (two) times daily as needed for anxiety.   cetirizine 10 MG tablet Commonly known as: ZYRTEC Take 10 mg by mouth in the morning.  citalopram 20 MG tablet Commonly known as: CELEXA Take 20 mg by mouth in the morning.   esomeprazole  40 MG capsule Commonly known as: NEXIUM  TAKE 1 CAPSULE BY MOUTH ONCE DAILY AT NOON   olmesartan 40 MG tablet Commonly known as: BENICAR Take 40 mg by mouth in the morning.   omeprazole  40 MG capsule Commonly known as: PRILOSEC Take 1 capsule (40 mg total) by mouth 2 (two) times daily before a meal.   ondansetron  4 MG tablet Commonly known as: Zofran  Take 1 tablet (4 mg total) by mouth every 6 (six) hours as needed for nausea or vomiting.   oxyCODONE -acetaminophen  5-325 MG tablet Commonly known  as: Percocet Take 1 tablet by mouth every 4 (four) hours as needed for severe pain (pain score 7-10).   sucralfate  1 g tablet Commonly known as: Carafate  Take 1 tablet (1 g total) by mouth 4 (four) times daily -  with meals and at bedtime.   tamsulosin  0.4 MG Caps capsule Commonly known as: FLOMAX  Take 1 capsule (0.4 mg total) by mouth daily after supper.        Allergies: Allergies[1]  Family History: Family History  Problem Relation Age of Onset   Hypertension Mother    Asthma Father    Hypertension Father    Hypertension Maternal Grandfather    Diabetes Paternal Grandmother    Hypertension Paternal Grandmother    COPD Paternal Grandfather     Social History:  reports that she has never smoked. She has never used smokeless tobacco. She reports current drug use. Drug: Marijuana. She reports that she does not drink alcohol.  ROS: All other review of systems were reviewed and are negative except what is noted above in HPI  Physical Exam: BP (!) 139/91   Pulse 75   Constitutional:  Alert and oriented, No acute distress. HEENT: [place] AT, moist mucus membranes.  Trachea midline, no masses. Cardiovascular: No clubbing, cyanosis, or edema. Respiratory: Normal respiratory effort, no increased work of breathing. GI: Abdomen is soft, nontender, nondistended, no abdominal masses GU: No CVA tenderness.  Lymph: No cervical or inguinal lymphadenopathy. Skin: No rashes, bruises or suspicious lesions. Neurologic: Grossly intact, no focal deficits, moving all 4 extremities. Psychiatric: Normal mood and affect.  Laboratory Data: Lab Results  Component Value Date   WBC 11.9 (H) 10/23/2024   HGB 11.8 (L) 10/23/2024   HCT 35.7 (L) 10/23/2024   MCV 83.8 10/23/2024   PLT 370 10/23/2024    Lab Results  Component Value Date   CREATININE 0.93 10/23/2024    No results found for: PSA  No results found for: TESTOSTERONE  No results found for: HGBA1C  Urinalysis     Component Value Date/Time   COLORURINE YELLOW 10/23/2024 0228   APPEARANCEUR HAZY (A) 10/23/2024 0228   APPEARANCEUR Cloudy (A) 04/28/2015 1500   LABSPEC 1.010 10/23/2024 0228   PHURINE 6.0 10/23/2024 0228   GLUCOSEU NEGATIVE 10/23/2024 0228   HGBUR MODERATE (A) 10/23/2024 0228   BILIRUBINUR NEGATIVE 10/23/2024 0228   BILIRUBINUR negative 04/22/2021 1300   BILIRUBINUR Negative 04/28/2015 1500   KETONESUR NEGATIVE 10/23/2024 0228   PROTEINUR NEGATIVE 10/23/2024 0228   UROBILINOGEN 0.2 04/22/2021 1300   UROBILINOGEN 0.2 06/09/2015 1920   NITRITE NEGATIVE 10/23/2024 0228   LEUKOCYTESUR TRACE (A) 10/23/2024 0228    Lab Results  Component Value Date   LABMICR See below: 04/28/2015   WBCUA 6-10 (A) 04/28/2015   RBCUA 0-2 04/28/2015   LABEPIT >10 (A) 04/28/2015   MUCUS Present  04/28/2015   BACTERIA RARE (A) 10/23/2024    Pertinent Imaging: KUB today: Images reviewed and discussed with the patient  No results found for this or any previous visit.  No results found for this or any previous visit.  No results found for this or any previous visit.  No results found for this or any previous visit.  Results for orders placed during the hospital encounter of 05/29/24  US  RENAL  Narrative CLINICAL DATA:  Renal and ureteral calculus, left flank pain for 1-2 weeks  EXAM: RENAL / URINARY TRACT ULTRASOUND COMPLETE  COMPARISON:  07/09/2018  FINDINGS: Right Kidney:  Renal measurements: 11.4 x 5.2 x 5.5 cm = volume: 171.6 mL. Echogenicity within normal limits. No mass or hydronephrosis visualized.  Left Kidney:  Renal measurements: 10.5 x 5.0 x 5.4 cm = volume: 145.5 mL. Echogenicity within normal limits. No mass or hydronephrosis visualized.  Bladder:  Bladder is decompressed, limiting its evaluation.  Other:  None.  IMPRESSION: 1. Unremarkable renal ultrasound. No urinary tract calculi or obstructive uropathy.   Electronically Signed By: Ozell Daring  M.D. On: 05/29/2024 12:48  No results found for this or any previous visit.  No results found for this or any previous visit.  Results for orders placed during the hospital encounter of 10/23/24  CT Renal Coopersmith Study  Narrative CLINICAL DATA:  Abdominal and flank pain.  EXAM: CT ABDOMEN AND PELVIS WITHOUT CONTRAST  TECHNIQUE: Multidetector CT imaging of the abdomen and pelvis was performed following the standard protocol without IV contrast.  RADIATION DOSE REDUCTION: This exam was performed according to the departmental dose-optimization program which includes automated exposure control, adjustment of the mA and/or kV according to patient size and/or use of iterative reconstruction technique.  COMPARISON:  09/08/201  FINDINGS: Lower chest: No acute findings.  Hepatobiliary: No suspicious focal abnormality in the liver on this study without intravenous contrast. There is no evidence for gallstones, gallbladder wall thickening, or pericholecystic fluid. No intrahepatic or extrahepatic biliary dilation.  Pancreas: No focal mass lesion. No dilatation of the main duct. No intraparenchymal cyst. No peripancreatic edema.  Spleen: No splenomegaly. No suspicious focal mass lesion.  Adrenals/Urinary Tract: No adrenal nodule or mass. 2 mm nonobstructing stones are seen in the interpolar and lower pole regions of the left kidney. Left ureter unremarkable. Mild right hydroureteronephrosis evident with 2 mm Kincy in the distal right ureter about 1 cm proximal to the UVJ (see axial 80/2). Bladder is decompressed.  Stomach/Bowel: Stomach is unremarkable. No gastric wall thickening. No evidence of outlet obstruction. Duodenum is normally positioned as is the ligament of Treitz. No small bowel wall thickening. No small bowel dilatation. The terminal ileum is normal. Nonvisualization of the appendix is consistent with the reported history of appendectomy. No gross colonic mass. No  colonic wall thickening.  Vascular/Lymphatic: No abdominal aortic aneurysm. No abdominal aortic atherosclerotic calcification. There is no gastrohepatic or hepatoduodenal ligament lymphadenopathy. No retroperitoneal or mesenteric lymphadenopathy. No pelvic sidewall lymphadenopathy.  Reproductive: Unremarkable.  Other: No intraperitoneal free fluid.  Musculoskeletal: No worrisome lytic or sclerotic osseous abnormality.  IMPRESSION: 1. 2 mm distal right ureteral H[place] with mild right hydroureteronephrosis. 2. 2 mm nonobstructing stones in the interpolar and lower pole regions of the left kidney.   Electronically Signed By: Camellia Candle M.D. On: 10/23/2024 05:41   Assessment & Plan:    1. Ureteral Czerwinski (Primary) -We discussed the management of kidney stones. These options include observation, ureteroscopy, shockwave lithotripsy (ESWL) and percutaneous nephrolithotomy (  PCNL). We discussed which options are relevant to the patient's Rudin(s). We discussed the natural history of kidney stones as well as the complications of untreated stones and the impact on quality of life without treatment as well as with each of the above listed treatments. We also discussed the efficacy of each treatment in its ability to clear the Al[place] burden. With any of these management options I discussed the signs and symptoms of infection and the need for emergent treatment should these be experienced. For each option we discussed the ability of each procedure to clear the patient of their Acocella burden.   For observation I described the risks which include but are not limited to silent renal damage, life-threatening infection, need for emergent surgery, failure to pass Sanzo and pain.   For ureteroscopy I described the risks which include bleeding, infection, damage to contiguous structures, positioning injury, ureteral stricture, ureteral avulsion, ureteral injury, need for prolonged ureteral stent,  inability to perform ureteroscopy, need for an interval procedure, inability to clear Postel burden, stent discomfort/pain, heart attack, stroke, pulmonary embolus and the inherent risks with general anesthesia.   For shockwave lithotripsy I described the risks which include arrhythmia, kidney contusion, kidney hemorrhage, need for transfusion, pain, inability to adequately break up Farfan, inability to pass Birchard fragments, Steinstrasse, infection associated with obstructing stones, need for alternate surgical procedure, need for repeat shockwave lithotripsy, MI, CVA, PE and the inherent risks with anesthesia/conscious sedation.   For PCNL I described the risks including positioning injury, pneumothorax, hydrothorax, need for chest tube, inability to clear Lierman burden, renal laceration, arterial venous fistula or malformation, need for embolization of kidney, loss of kidney or renal function, need for repeat procedure, need for prolonged nephrostomy tube, ureteral avulsion, MI, CVA, PE and the inherent risks of general anesthesia.   - The patient would like to proceed with right ESWL - Urinalysis, Routine w reflex microscopic   No follow-ups on file.  [name], Cathy Wells  [facility]       [1]  Allergies Allergen Reactions   Doxycycline  Nausea And Vomiting   Amoxapine And Related Hives and Nausea And Vomiting   Azithromycin Other (See Comments)    MAKES ME SICK   Clarithromycin Nausea And Vomiting   Erythromycin Other (See Comments)    SICK ON STOMACH   Penicillins Hives    .Has patient had a PCN reaction causing immediate rash, facial/tongue/throat swelling, SOB or lightheadedness with hypotension: Unknown Has patient had a PCN reaction causing severe rash involving mucus membranes or skin necrosis: Unknown Has patient had a PCN reaction that required hospitalization: Unknown Has patient had a PCN reaction occurring within the last 10 years: No If all of the  above answers are NO, then may proceed with Cephalosporin use.    Sulfa Antibiotics Hives   "

## 2024-11-05 ENCOUNTER — Other Ambulatory Visit: Payer: Self-pay

## 2024-11-05 ENCOUNTER — Encounter (HOSPITAL_COMMUNITY): Payer: Self-pay

## 2024-11-05 ENCOUNTER — Encounter (HOSPITAL_COMMUNITY)
Admission: RE | Admit: 2024-11-05 | Discharge: 2024-11-05 | Disposition: A | Discharge status: Home / Self Care | Source: Ambulatory Visit | Attending: Urology | Admitting: Urology

## 2024-11-05 VITALS — Ht 62.0 in | Wt 215.0 lb

## 2024-11-05 DIAGNOSIS — N926 Irregular menstruation, unspecified: Secondary | ICD-10-CM

## 2024-11-06 ENCOUNTER — Ambulatory Visit (HOSPITAL_COMMUNITY)

## 2024-11-06 ENCOUNTER — Encounter: Admission: RE | Disposition: A | Payer: Self-pay | Attending: Urology

## 2024-11-06 ENCOUNTER — Ambulatory Visit (HOSPITAL_COMMUNITY)
Admission: RE | Admit: 2024-11-06 | Discharge: 2024-11-06 | Disposition: A | Discharge status: Home / Self Care | Attending: Urology | Admitting: Urology

## 2024-11-06 ENCOUNTER — Encounter: Payer: Self-pay | Admitting: Urology

## 2024-11-06 DIAGNOSIS — I10 Essential (primary) hypertension: Secondary | ICD-10-CM | POA: Diagnosis not present

## 2024-11-06 DIAGNOSIS — N201 Calculus of ureter: Secondary | ICD-10-CM | POA: Diagnosis present

## 2024-11-06 DIAGNOSIS — J45909 Unspecified asthma, uncomplicated: Secondary | ICD-10-CM | POA: Insufficient documentation

## 2024-11-06 DIAGNOSIS — F419 Anxiety disorder, unspecified: Secondary | ICD-10-CM | POA: Diagnosis not present

## 2024-11-06 DIAGNOSIS — K219 Gastro-esophageal reflux disease without esophagitis: Secondary | ICD-10-CM | POA: Insufficient documentation

## 2024-11-06 DIAGNOSIS — F17210 Nicotine dependence, cigarettes, uncomplicated: Secondary | ICD-10-CM | POA: Diagnosis not present

## 2024-11-06 DIAGNOSIS — N926 Irregular menstruation, unspecified: Secondary | ICD-10-CM

## 2024-11-06 HISTORY — PX: EXTRACORPOREAL SHOCK WAVE LITHOTRIPSY: SHX1557

## 2024-11-06 LAB — POCT PREGNANCY, URINE: Preg Test, Ur: NEGATIVE

## 2024-11-06 SURGERY — LITHOTRIPSY, ESWL
Anesthesia: LOCAL | Laterality: Right

## 2024-11-06 MED ORDER — OXYCODONE-ACETAMINOPHEN 5-325 MG PO TABS: 1.0000 | ORAL_TABLET | ORAL | 0 refills | Status: AC | PRN

## 2024-11-06 MED ORDER — TAMSULOSIN HCL 0.4 MG PO CAPS: 0.4000 mg | ORAL_CAPSULE | Freq: Every day | ORAL | 0 refills | Status: AC

## 2024-11-06 MED ORDER — ONDANSETRON HCL 4 MG PO TABS: 4.0000 mg | ORAL_TABLET | Freq: Four times a day (QID) | ORAL | 0 refills | Status: AC | PRN

## 2024-11-06 MED ORDER — DIPHENHYDRAMINE HCL 25 MG PO CAPS
25.0000 mg | ORAL_CAPSULE | ORAL | Status: DC
  Filled 2024-11-06: qty 1

## 2024-11-06 MED ORDER — SODIUM CHLORIDE 0.9 % IV SOLN: INTRAVENOUS | Status: DC

## 2024-11-06 MED ORDER — DIAZEPAM 5 MG PO TABS
10.0000 mg | ORAL_TABLET | Freq: Once | ORAL | Status: AC
  Administered 2024-11-06: 10 mg via ORAL
  Filled 2024-11-06: qty 2

## 2024-11-06 NOTE — Patient Instructions (Signed)
 ESWL for Kidney Stones  Extracorporeal shock wave lithotripsy (ESWL) is a treatment that can help break up kidney stones that are too large to pass on their own.  This is a nonsurgical procedure that breaks up a kidney stone with shock waves. These shock waves pass through your body and focus on the kidney stone. They cause the kidney stone to break into smaller pieces (fragments) while it is still in the urinary tract. The fragments of stone can pass more easily out of your body in the pee (urine). Tell a health care provider about: Any allergies you have. All medicines you are taking, including vitamins, herbs, eye drops, creams, and over-the-counter medicines. Any problems you or family members have had with anesthetic medicines. Any bleeding problems you have. Any surgeries you've had. Any medical conditions you have. Whether you're pregnant or may be pregnant. What are the risks? Your health care provider will talk with you about risks. These may include: Infection. Bleeding from the kidney. Bruising of the kidney or skin. Scarring of the kidney. This can lead to: Increased blood pressure. Poor kidney function. Return (recurrence) of kidney stones. Damage to other structures or organs. This may include the liver, colon, spleen, or pancreas. Blockage (obstruction) of the tube that carries pee from the kidney to the bladder (ureter). Failure of the kidney stone to break into fragments. What happens before the procedure? When to stop eating and drinking Follow instructions from your health care provider about what you may eat and drink. These may include: 8 hours before your procedure Stop eating most foods. Do not eat meat, fried foods, or fatty foods. Eat only light foods, such as toast or crackers. All liquids are okay except energy drinks and alcohol. 6 hours before your procedure Stop eating. Drink only clear liquids, such as water, clear fruit juice, black coffee, plain tea,  and sports drinks. Do not drink energy drinks or alcohol. 2 hours before your procedure Stop drinking all liquids. You may be allowed to take medicines with small sips of water. If you do not follow your health care provider's instructions, your procedure may be delayed or canceled. Medicines Ask your health care provider about: Changing or stopping your regular medicines. These include any diabetes medicines or blood thinners you take. Taking medicines such as aspirin and ibuprofen. These medicines can thin your blood. Do not take them unless your health care provider tells you to. Taking over-the-counter medicines, vitamins, herbs, and supplements. Tests You may have tests, such as: Blood tests. Pee (urine) tests. Imaging tests. This may include a CT scan. Surgery safety Ask your health care provider: How your surgery site will be marked. What steps will be taken to help prevent infection. These steps may include: Washing skin with a soap that kills germs. Receiving antibiotics. General instructions If you will be going home right after the procedure, plan to have a responsible adult: Take you home from the hospital or clinic. You will not be allowed to drive. Care for you for the time you are told. What happens during the procedure?  An IV will be inserted into one of your veins. You may be given: A sedative. This helps you relax. Anesthesia. This will: Numb certain areas of your body. Make you fall asleep for surgery. A water-filled cushion may be placed behind your kidney or on your abdomen. In some cases, you may be placed in a tub of lukewarm water. Your body will be positioned in a way that makes it  easier to target the kidney stone. An X-ray or ultrasound exam will be done to locate your stone. Shock waves will be aimed at the stone. If you are awake, you may feel a tapping sensation as the shock waves pass through your body. A small mesh tube (stent) may be placed in  your ureter. This will help keep pee flowing from the kidney if the fragments of the stone have been blocking the ureter. The stent will be removed at a later time by your health care provider. The procedure may vary among health care providers and hospitals. What happens after the procedure? Your blood pressure, heart rate, breathing rate, and blood oxygen level will be monitored until you leave the hospital or clinic. You may have an X-ray after the procedure to see how many of the kidney stones were broken up. This will also show how much of the stone has passed. If there are still large fragments after treatment, you may need to have a second procedure at a later time. This information is not intended to replace advice given to you by your health care provider. Make sure you discuss any questions you have with your health care provider. Document Revised: 04/30/2023 Document Reviewed: 02/18/2022 Elsevier Patient Education  2024 [employer].

## 2024-11-06 NOTE — Interval H&P Note (Signed)
 History and Physical Interval Note:  11/06/2024 7:35 AM  Cathy Wells  has presented today for surgery, with the diagnosis of right ureteral Ek.  The various methods of treatment have been discussed with the patient and family. After consideration of risks, benefits and other options for treatment, the patient has consented to  Procedures: LITHOTRIPSY, ESWL (Right) as a surgical intervention.  The patient's history has been reviewed, patient examined, no change in status, stable for surgery.  I have reviewed the patient's chart and labs.  Questions were answered to the patient's satisfaction.     [name]

## 2024-11-07 ENCOUNTER — Encounter (HOSPITAL_COMMUNITY): Payer: Self-pay | Admitting: Urology

## 2024-11-13 ENCOUNTER — Ambulatory Visit (HOSPITAL_COMMUNITY)
Admission: RE | Admit: 2024-11-13 | Discharge: 2024-11-13 | Disposition: A | Discharge status: Home / Self Care | Source: Ambulatory Visit | Attending: Urology | Admitting: Urology

## 2024-11-13 DIAGNOSIS — N201 Calculus of ureter: Secondary | ICD-10-CM | POA: Diagnosis present

## 2024-11-14 ENCOUNTER — Ambulatory Visit: Admitting: Urology

## 2024-11-14 ENCOUNTER — Encounter: Payer: Self-pay | Admitting: Urology

## 2024-11-14 VITALS — BP 148/104 | HR 88

## 2024-11-14 DIAGNOSIS — N201 Calculus of ureter: Secondary | ICD-10-CM

## 2024-11-14 DIAGNOSIS — Z466 Encounter for fitting and adjustment of urinary device: Secondary | ICD-10-CM

## 2024-11-14 LAB — URINALYSIS, ROUTINE W REFLEX MICROSCOPIC
Bilirubin, UA: NEGATIVE
Glucose, UA: NEGATIVE
Ketones, UA: NEGATIVE
Nitrite, UA: NEGATIVE
Protein,UA: NEGATIVE
Specific Gravity, UA: 1.01 (ref 1.005–1.030)
Urobilinogen, Ur: 0.2 mg/dL (ref 0.2–1.0)
pH, UA: 6.5 (ref 5.0–7.5)

## 2024-11-14 LAB — MICROSCOPIC EXAMINATION
Bacteria, UA: NONE SEEN
RBC, Urine: >30 /HPF — AB (ref 0–2)

## 2024-11-14 MED ORDER — CIPROFLOXACIN HCL 500 MG PO TABS
500.0000 mg | ORAL_TABLET | Freq: Once | ORAL | Status: AC
  Administered 2024-11-14: 500 mg via ORAL

## 2024-11-14 NOTE — Patient Instructions (Signed)

## 2024-11-14 NOTE — Progress Notes (Signed)
" ° °  11/14/2024  CC: followup nephrolithiasis  HPI: Ms Eifert is a 34yo here for ureteral stent removal Blood pressure (!) 148/104, pulse 88, last menstrual period 11/01/2024. NED. A&Ox3.   No respiratory distress   Abd soft, NT, ND Normal external genitalia with patent urethral meatus  Cystoscopy Procedure Note  Patient identification was confirmed, informed consent was obtained, and patient was prepped using Betadine solution.  Lidocaine  jelly was administered per urethral meatus.    Procedure: - Flexible cystoscope introduced, without any difficulty.   - Thorough search of the bladder revealed:    normal urethral meatus    normal urothelium    no stones    no ulcers     no tumors    no urethral polyps    no trabeculation  - Ureteral orifices were normal in position and appearance. -Using a grasper the right ureteral stent was removed intact  Post-Procedure: - Patient tolerated the procedure well  Assessment/ Plan: Followup 3 months with renal US . Dietary handout given    No follow-ups on file.  [name], MD  "

## 2024-11-15 ENCOUNTER — Encounter: Payer: Self-pay | Admitting: Urology

## 2025-02-15 ENCOUNTER — Ambulatory Visit: Admitting: Urology
